# Patient Record
Sex: Male | Born: 1950
Health system: Southern US, Community
[De-identification: ages and names within clinical notes are randomized; demographics above are authoritative.]

## PROBLEM LIST (undated history)

## (undated) DIAGNOSIS — Z8701 Personal history of pneumonia (recurrent): Secondary | ICD-10-CM

## (undated) DIAGNOSIS — M109 Gout, unspecified: Secondary | ICD-10-CM

## (undated) DIAGNOSIS — J189 Pneumonia, unspecified organism: Secondary | ICD-10-CM

## (undated) DIAGNOSIS — R809 Proteinuria, unspecified: Secondary | ICD-10-CM

## (undated) DIAGNOSIS — Z87442 Personal history of urinary calculi: Secondary | ICD-10-CM

## (undated) DIAGNOSIS — N529 Male erectile dysfunction, unspecified: Secondary | ICD-10-CM

## (undated) DIAGNOSIS — D569 Thalassemia, unspecified: Secondary | ICD-10-CM

## (undated) DIAGNOSIS — Z1211 Encounter for screening for malignant neoplasm of colon: Secondary | ICD-10-CM

## (undated) DIAGNOSIS — E1129 Type 2 diabetes mellitus with other diabetic kidney complication: Secondary | ICD-10-CM

## (undated) DIAGNOSIS — E785 Hyperlipidemia, unspecified: Secondary | ICD-10-CM

## (undated) DIAGNOSIS — Z87438 Personal history of other diseases of male genital organs: Secondary | ICD-10-CM

## (undated) DIAGNOSIS — I10 Essential (primary) hypertension: Secondary | ICD-10-CM

## (undated) DIAGNOSIS — B0050 Herpesviral ocular disease, unspecified: Secondary | ICD-10-CM

## (undated) DIAGNOSIS — H269 Unspecified cataract: Secondary | ICD-10-CM

## (undated) DIAGNOSIS — C44319 Basal cell carcinoma of skin of other parts of face: Secondary | ICD-10-CM

## (undated) DIAGNOSIS — R5382 Chronic fatigue, unspecified: Secondary | ICD-10-CM

## (undated) DIAGNOSIS — J349 Unspecified disorder of nose and nasal sinuses: Secondary | ICD-10-CM

## (undated) DIAGNOSIS — B029 Zoster without complications: Secondary | ICD-10-CM

## (undated) DIAGNOSIS — D509 Iron deficiency anemia, unspecified: Secondary | ICD-10-CM

## (undated) DIAGNOSIS — Z8719 Personal history of other diseases of the digestive system: Secondary | ICD-10-CM

## (undated) DIAGNOSIS — E119 Type 2 diabetes mellitus without complications: Secondary | ICD-10-CM

## (undated) DIAGNOSIS — G5602 Carpal tunnel syndrome, left upper limb: Secondary | ICD-10-CM

## (undated) DIAGNOSIS — C4491 Basal cell carcinoma of skin, unspecified: Secondary | ICD-10-CM

## (undated) DIAGNOSIS — N200 Calculus of kidney: Secondary | ICD-10-CM

## (undated) HISTORY — DX: Essential (primary) hypertension: I10

## (undated) HISTORY — DX: Thalassemia, unspecified: D56.9

## (undated) HISTORY — DX: Iron deficiency anemia, unspecified: D50.9

## (undated) HISTORY — DX: Herpesviral ocular disease, unspecified: B00.50

## (undated) HISTORY — DX: Hyperlipidemia, unspecified: E78.5

## (undated) HISTORY — DX: Zoster without complications: B02.9

## (undated) HISTORY — DX: Basal cell carcinoma of skin of other parts of face: C44.319

## (undated) HISTORY — DX: Personal history of other diseases of male genital organs: Z87.438

## (undated) HISTORY — DX: Personal history of other diseases of the digestive system: Z87.19

## (undated) HISTORY — DX: Gout, unspecified: M10.9

## (undated) HISTORY — DX: Calculus of kidney: N20.0

## (undated) HISTORY — DX: Encounter for screening for malignant neoplasm of colon: Z12.11

## (undated) HISTORY — DX: Type 2 diabetes mellitus without complications: E11.9

## (undated) HISTORY — DX: Personal history of pneumonia (recurrent): Z87.01

## (undated) HISTORY — DX: Unspecified cataract: H26.9

## (undated) HISTORY — DX: Chronic fatigue, unspecified: R53.82

## (undated) HISTORY — DX: Pneumonia, unspecified organism: J18.9

## (undated) HISTORY — PX: WISDOM TOOTH EXTRACTION: SHX21

## (undated) HISTORY — DX: Type 2 diabetes mellitus with other diabetic kidney complication: E11.29

## (undated) HISTORY — DX: Type 2 diabetes mellitus with other diabetic kidney complication: R80.9

## (undated) HISTORY — DX: Male erectile dysfunction, unspecified: N52.9

---
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
## 2013-02-28 ENCOUNTER — Ambulatory Visit (INDEPENDENT_AMBULATORY_CARE_PROVIDER_SITE_OTHER): Payer: BC Managed Care – PPO | Admitting: Family Medicine

## 2013-02-28 ENCOUNTER — Encounter: Payer: Self-pay | Admitting: Family Medicine

## 2013-02-28 VITALS — BP 163/90 | HR 97 | Temp 98.4°F | Resp 18 | Ht 73.0 in | Wt 219.0 lb

## 2013-02-28 DIAGNOSIS — J019 Acute sinusitis, unspecified: Secondary | ICD-10-CM

## 2013-02-28 DIAGNOSIS — R030 Elevated blood-pressure reading, without diagnosis of hypertension: Secondary | ICD-10-CM

## 2013-02-28 MED ORDER — AMOXICILLIN 875 MG PO TABS: 875.0000 mg | ORAL_TABLET | Freq: Two times a day (BID) | ORAL | Status: DC

## 2013-02-28 MED ORDER — AZELASTINE-FLUTICASONE 137-50 MCG/ACT NA SUSP: NASAL | Status: DC

## 2013-02-28 NOTE — Progress Notes (Signed)
Office Note 02/28/2013  CC:  Chief Complaint  Patient presents with  . Establish Care  . Nasal Congestion    x 2 weeks    HPI:  Zachary Chandler is a 63 y.o. White male who is here to establish care and discuss sinus issues. Patient's most recent primary MD: none.  Describes occasional UC visit for "sinusitis", he can't recall which urgent care. Old records were not reviewed prior to or during today's visit.  Onset about 3 wks ago, started with subjective f/c and one episode of vomiting.  Felt malaise for the next 1 wk or so, then started to get ST, itchy ears, sneezing, peri-orbital HA.  Nasal congestion/runny nose, some puffiness of inferior eyelids, some diffuse facial pressure/pain.  Mild coughing.  Sx's worse in evenings. No fever since that initial day of illness.  Taking ibuprofen round the clock. Some OTC meds helped stem the sx's initially.  He checks bp every couple weeks, usually  150s on top and 80s on bottom.  Says bp usually higher at MD office.  He admits he has been told at an urgent care visit in remote past that he has HTN and needs to get on med.   Past Medical History  Diagnosis Date  . Kidney stones 1980s  . History of prostatitis 1980s    Remote hx of seeing MD's at [place] [facility]  . Gout of big toe 1990s    only one episode; he cut back on peanut butter and hasn't had any prob since  . Elevated blood pressure reading without diagnosis of hypertension 02/28/2013    Past Surgical History  Procedure Laterality Date  . Nasal polyp excision  1960    Family History  Problem Relation Age of Onset  . Heart disease Mother   . Hypertension Mother   . Diabetes Mother   . Heart disease Father   . Heart attack Sister     History   Social History  . Marital Status: Married    Spouse Name: N/A    Number of Children: N/A  . Years of Education: N/A   Occupational History  . Not on file.   Social History Main Topics  . Smoking status: Former  Smoker    Quit date: 02/28/1970  . Smokeless tobacco: Never Used  . Alcohol Use: No  . Drug Use: No  . Sexual Activity: Not on file   Other Topics Concern  . Not on file   Social History Narrative   Married, has one 71 y/o son.   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     Orig from [place].   Was in [phone].  No combat duty.  No VA care.   Smoked 10-12 yrs, quit in his early 22s.   Alcohol: none   No hx of drugs.               MEDS: neosynephrin 4-5 times per day lately, zyrtec qd prn,   No Known Allergies  ROS Review of Systems  Constitutional: Positive for fatigue. Negative for fever.  HENT: Negative for ear pain, nosebleeds and sore throat.   Eyes: Negative for visual disturbance.  Respiratory: Negative for chest tightness, shortness of breath and wheezing.   Cardiovascular: Negative for chest pain and leg swelling.  Gastrointestinal: Negative for nausea and abdominal pain.  Genitourinary: Negative for dysuria.  Musculoskeletal: Negative for back pain and joint swelling.  Skin: Negative for rash.  Neurological: Negative for weakness and  headaches.  Hematological: Negative for adenopathy.  Psychiatric/Behavioral: Negative for dysphoric mood. The patient is not nervous/anxious.      PE; Blood pressure 163/90, pulse 97, temperature 98.4 F (36.9 C), temperature source Temporal, resp. rate 18, height 6\' 1"  (1.854 m), weight 219 lb (99.338 kg), SpO2 97.00%. Gen: Alert, well appearing.  Patient is oriented to person, place, time, and situation. AFFECT: pleasant, lucid thought and speech. HEENT: eyes without injection, drainage, or swelling.  Ears: EACs clear, TMs with normal light reflex and landmarks.  Nose: Clear rhinorrhea, with some dried, crusty exudate adherent to mildly injected mucosa.  No purulent d/c.  No paranasal sinus TTP.  No facial swelling.  Throat and mouth without focal lesion.  No pharyngial swelling, erythema, or exudate.    Neck: supple, no LAD.   LUNGS: CTA bilat, nonlabored resps.   CV: RRR, no m/r/g. EXT: no c/c/e SKIN: no rash  Pertinent labs:  none  ASSESSMENT AND PLAN:   New pt: no old records to obtain  Sinusitis, acute Amoxil 875mg  bid x 10d. Discussed importance of weening off the neo-synephrine.  He expressed reluctance but agreed to try. Will start trial of dymista 1 spray each nostril bid.  Elevated blood pressure reading without diagnosis of hypertension Likely has essential HTN, but with frequent use of neo-synephrine lately we need to see how his bp is OFF of this type of med. I recommended he also avoid OTC oral decongestants. He'll check bp 1-2 times per day and we'll review these at next visit.   Spent 40 min with pt today, with >50% of this time spent in counseling and care coordination regarding the above problems.  Return for f/u at pt's convenience for fasting CPE.

## 2013-02-28 NOTE — Assessment & Plan Note (Signed)
Amoxil 875mg  bid x 10d. Discussed importance of weening off the neo-synephrine.  He expressed reluctance but agreed to try. Will start trial of dymista 1 spray each nostril bid.

## 2013-02-28 NOTE — Progress Notes (Signed)
Pre visit review using our clinic review tool, if applicable. No additional management support is needed unless otherwise documented below in the visit note. 

## 2013-02-28 NOTE — Assessment & Plan Note (Signed)
Likely has essential HTN, but with frequent use of neo-synephrine lately we need to see how his bp is OFF of this type of med. I recommended he also avoid OTC oral decongestants. He'll check bp 1-2 times per day and we'll review these at next visit.

## 2013-08-31 ENCOUNTER — Ambulatory Visit (INDEPENDENT_AMBULATORY_CARE_PROVIDER_SITE_OTHER): Payer: Commercial Managed Care - HMO | Admitting: Family Medicine

## 2013-08-31 ENCOUNTER — Encounter: Payer: Self-pay | Admitting: Family Medicine

## 2013-08-31 VITALS — BP 176/99 | HR 68 | Temp 98.0°F | Resp 18 | Ht 73.0 in | Wt 225.0 lb

## 2013-08-31 DIAGNOSIS — I10 Essential (primary) hypertension: Secondary | ICD-10-CM

## 2013-08-31 DIAGNOSIS — R5383 Other fatigue: Secondary | ICD-10-CM

## 2013-08-31 DIAGNOSIS — R81 Glycosuria: Secondary | ICD-10-CM | POA: Insufficient documentation

## 2013-08-31 DIAGNOSIS — N410 Acute prostatitis: Secondary | ICD-10-CM | POA: Insufficient documentation

## 2013-08-31 DIAGNOSIS — R5381 Other malaise: Secondary | ICD-10-CM | POA: Insufficient documentation

## 2013-08-31 LAB — HEMOGLOBIN A1C: Hgb A1c MFr Bld: 11.6 % — ABNORMAL HIGH (ref 4.6–6.5)

## 2013-08-31 LAB — POCT URINALYSIS DIPSTICK
BILIRUBIN UA: NEGATIVE
Blood, UA: NEGATIVE
GLUCOSE UA: 500
Ketones, UA: NEGATIVE
Leukocytes, UA: NEGATIVE
Nitrite, UA: NEGATIVE
Protein, UA: NEGATIVE
SPEC GRAV UA: 1.02
UROBILINOGEN UA: 0.2
pH, UA: 5.5

## 2013-08-31 LAB — BASIC METABOLIC PANEL
BUN: 12 mg/dL (ref 6–23)
CALCIUM: 9.3 mg/dL (ref 8.4–10.5)
CO2: 23 mEq/L (ref 19–32)
CREATININE: 0.9 mg/dL (ref 0.4–1.5)
Chloride: 103 mEq/L (ref 96–112)
GFR: 94.77 mL/min (ref >60.00)
Glucose, Bld: 283 mg/dL — ABNORMAL HIGH (ref 70–99)
Potassium: 4.1 mEq/L (ref 3.5–5.1)
Sodium: 136 mEq/L (ref 135–145)

## 2013-08-31 LAB — TSH: TSH: 0.93 u[IU]/mL (ref 0.35–4.50)

## 2013-08-31 MED ORDER — CIPROFLOXACIN HCL 750 MG PO TABS: 750.0000 mg | ORAL_TABLET | Freq: Two times a day (BID) | ORAL | Status: DC

## 2013-08-31 NOTE — Assessment & Plan Note (Signed)
Ciprofloxacin 750mg  bid x 14d. Sent urine for c/s for completeness.

## 2013-08-31 NOTE — Assessment & Plan Note (Signed)
Check serum glucose and HbA1c today. Certainly if he has undiagnosed diabetes then this could explain his fatigue.

## 2013-08-31 NOTE — Progress Notes (Signed)
OFFICE VISIT  08/31/2013   CC:  Chief Complaint  Patient presents with  . Urinary Frequency    started last Thursday  . Fatigue  . sweats   HPI:    Patient is a 63 y.o. Caucasian male who presents for urinary ugency but difficulty getting anything out, some suprapubic pressure.  Mild discomfort with urination but not exactly burning.  Feels better lying supine.  Pees easier sitting on commode.  Urine flow starting and stopping.  Not much nocturia:  Pt not clear on this point, though. Onset of sx's 7 days ago.  No nausea or fever.   BP usually 150s/70s at home.  +Hx of elevated bp here w/out dx of HTN.  Other issues to discuss: chronic tiredness despite sleeping well. Erectile dysfunction.  We've agreed to put these off until another visit.  Past Medical History  Diagnosis Date  . Kidney stones 1980s  . History of prostatitis 1980s    Remote hx of seeing Dr. Terance Hart at [facility] urology  . Gout of big toe 1990s    only one episode; he cut back on peanut butter and hasn't had any prob since  . Elevated blood pressure reading without diagnosis of hypertension 02/28/2013    Past Surgical History  Procedure Laterality Date  . Nasal polyp excision  1960    MEDS: Dymista daily  No Known Allergies  ROS As per HPI  PE: Blood pressure 176/99, pulse 68, temperature 98 F (36.7 C), temperature source Temporal, resp. rate 18, height 6\' 1"  (1.854 m), weight 225 lb (102.059 kg), SpO2 97.00%. Gen: Alert, well appearing.  Patient is oriented to person, place, time, and situation. VEH:MCNO: no injection, icteris, swelling, or exudate.  EOMI, PERRLA. Mouth: lips without lesion/swelling.  Oral mucosa pink and moist. Oropharynx without erythema, exudate, or swelling.  CV: RRR, no m/r/g.   LUNGS: CTA bilat, nonlabored resps, good aeration in all lung fields. ABD: soft, NT, ND, BS normal.  No hepatospenomegaly or mass.  No bruits. EXT: no clubbing, cyanosis, or edema.  RECTAL: mild  diffuse prostate fluctuance but not really tender.  Some pressure and urge to urinate with palpation of prostate.  Prostate not significantly enlarged and no nodule or induration or asymmetry.  LABS:  CC UA today: gluc 500mg /dl, otherwise normal  IMPRESSION AND PLAN:  Acute prostatitis Ciprofloxacin 750mg  bid x 14d. Sent urine for c/s for completeness.  HTN (hypertension), benign Will check Cr/lytes. If these are normal then will start irbesartan 150mg  qd. This could be contributing to his feeling of being tired all the time.  Glucosuria Check serum glucose and HbA1c today. Certainly if he has undiagnosed diabetes then this could explain his fatigue.  Other malaise and fatigue Multifactorial: infection, uncontrolled HTN, possibly DM 2 new dx (labs pending).   An After Visit Summary was printed and given to the patient.  FOLLOW UP: Return in about 2 weeks (around 09/14/2013) for f/u prostatitis and HTN.

## 2013-08-31 NOTE — Progress Notes (Signed)
Pre visit review using our clinic review tool, if applicable. No additional management support is needed unless otherwise documented below in the visit note. 

## 2013-08-31 NOTE — Assessment & Plan Note (Signed)
Will check Cr/lytes. If these are normal then will start irbesartan 150mg  qd. This could be contributing to his feeling of being tired all the time.

## 2013-08-31 NOTE — Assessment & Plan Note (Signed)
Multifactorial: infection, uncontrolled HTN, possibly DM 2 new dx (labs pending).

## 2013-09-01 ENCOUNTER — Telehealth: Payer: Self-pay | Admitting: Family Medicine

## 2013-09-01 LAB — URINE CULTURE
COLONY COUNT: NO GROWTH
ORGANISM ID, BACTERIA: NO GROWTH

## 2013-09-01 MED ORDER — METFORMIN HCL 500 MG PO TABS: 500.0000 mg | ORAL_TABLET | Freq: Two times a day (BID) | ORAL | Status: DC

## 2013-09-01 MED ORDER — IRBESARTAN 150 MG PO TABS: 150.0000 mg | ORAL_TABLET | Freq: Every day | ORAL | Status: DC

## 2013-09-01 NOTE — Addendum Note (Signed)
Addended by: Jannette Spanner on: 09/01/2013 11:45 AM   Modules accepted: Orders

## 2013-09-01 NOTE — Telephone Encounter (Signed)
Relevant patient education mailed to patient.  

## 2013-09-06 ENCOUNTER — Ambulatory Visit (INDEPENDENT_AMBULATORY_CARE_PROVIDER_SITE_OTHER): Payer: Commercial Managed Care - HMO | Admitting: Family Medicine

## 2013-09-06 ENCOUNTER — Encounter: Payer: Self-pay | Admitting: Family Medicine

## 2013-09-06 VITALS — BP 158/91 | HR 73 | Temp 98.3°F | Resp 18 | Ht 73.0 in | Wt 225.0 lb

## 2013-09-06 DIAGNOSIS — E1165 Type 2 diabetes mellitus with hyperglycemia: Principal | ICD-10-CM

## 2013-09-06 DIAGNOSIS — I10 Essential (primary) hypertension: Secondary | ICD-10-CM

## 2013-09-06 DIAGNOSIS — N410 Acute prostatitis: Secondary | ICD-10-CM

## 2013-09-06 DIAGNOSIS — IMO0001 Reserved for inherently not codable concepts without codable children: Secondary | ICD-10-CM

## 2013-09-06 MED ORDER — METFORMIN HCL 1000 MG PO TABS: 1000.0000 mg | ORAL_TABLET | Freq: Two times a day (BID) | ORAL | Status: DC

## 2013-09-06 NOTE — Progress Notes (Signed)
OFFICE VISIT  09/07/2013   CC:  Chief Complaint  Patient presents with  . Diabetes  . Dizziness    since starting new medications   HPI:    Patient is a 63 y.o. Caucasian male who presents for 6d f/u for new dx HTN, new dx DM 2, and acute prostatitis. Started cipro, irbesartan 150mg  qd, and metformin 500 mg bid. BP checks from home reviewed today: avg syst 145, avg diast 80. Feels a bit jumpy, on edge--intermittently.  Still very fatigued.  Says urinary sx's are much improved, still just a "twinge" of discomfort.   He says he is tolerating metformin and irbesartan fine.   Discussed new dx DM 2, monitoring plans, initial mgmt plans. He feels comfortable with DM diet given past experience with relatives/friends required to eat diabetic diet. He is apprehensive about his new dx's, does not want to take insulin. He is eating and drinking w/out problem.  No fever, no nausea, no abd pain.  Past Medical History  Diagnosis Date  . Kidney stones 1980s  . History of prostatitis 1980s    Remote hx of seeing Dr. Terance Hart at [facility] u[facility]  . Gout of big toe 1990s    only one episode; he cut back on peanut butter and hasn't had any prob since  . Elevated blood pressure reading without diagnosis of hypertension 02/28/2013  . Diabetes mellitus, type 2 Dx'd 08/2013    Past Surgical History  Procedure Laterality Date  . Nasal polyp excision  1960    Outpatient Prescriptions Prior to Visit  Medication Sig Dispense Refill  . Azelastine-Fluticasone (DYMISTA) 137-50 MCG/ACT SUSP 1 spray in each nostril bid  1 Bottle  6  . ciprofloxacin (CIPRO) 750 MG tablet Take 1 tablet (750 mg total) by mouth 2 (two) times daily.  28 tablet  0  . irbesartan (AVAPRO) 150 MG tablet Take 1 tablet (150 mg total) by mouth daily.  30 tablet  6  . metFORMIN (GLUCOPHAGE) 500 MG tablet Take 1 tablet (500 mg total) by mouth 2 (two) times daily with a meal.  60 tablet  6   No facility-administered medications prior  to visit.    No Known Allergies  ROS As per HPI  PE: Blood pressure 158/91, pulse 73, temperature 98.3 F (36.8 C), temperature source Temporal, resp. rate 18, height 6\' 1"  (1.854 m), weight 225 lb (102.059 kg), SpO2 98.00%. Gen: Alert, well appearing.  Patient is oriented to person, place, time, and situation. AFFECT: pleasant, lucid thought and speech. No further exam today.  LABS:  CBG today (nonfasting): 265. Lab Results  Component Value Date   HGBA1C 11.6* 08/31/2013     Chemistry      Component Value Date/Time   NA 136 08/31/2013 1025   K 4.1 08/31/2013 1025   CL 103 08/31/2013 1025   CO2 23 08/31/2013 1025   BUN 12 08/31/2013 1025   CREATININE 0.9 08/31/2013 1025      Component Value Date/Time   CALCIUM 9.3 08/31/2013 1025      IMPRESSION AND PLAN:  Type II or unspecified type diabetes mellitus without mention of complication, uncontrolled Nutrition plan discussed: he has a good grasp of the basics, wants to do some reading on his own, declines my recommendation of nutritionist referral at this time. Increase metformin to 1000 mg bid.  Check glucose fasting and once postprandially for now.   HTN (hypertension), benign The current medical regimen is effective;  continue present plan and medications.  Acute prostatitis Resolving appropriately on abx.  An After Visit Summary was printed and given to the patient.  FOLLOW UP: Return in about 2 weeks (around 09/20/2013) for f/u DM and HTN.

## 2013-09-06 NOTE — Progress Notes (Signed)
Pre visit review using our clinic review tool, if applicable. No additional management support is needed unless otherwise documented below in the visit note. 

## 2013-09-07 ENCOUNTER — Encounter: Payer: Self-pay | Admitting: Family Medicine

## 2013-09-07 DIAGNOSIS — E118 Type 2 diabetes mellitus with unspecified complications: Secondary | ICD-10-CM | POA: Insufficient documentation

## 2013-09-07 NOTE — Assessment & Plan Note (Signed)
Nutrition plan discussed: he has a good grasp of the basics, wants to do some reading on his own, declines my recommendation of nutritionist referral at this time. Increase metformin to 1000 mg bid.  Check glucose fasting and once postprandially for now.

## 2013-09-07 NOTE — Assessment & Plan Note (Signed)
Resolving appropriately on abx.

## 2013-09-07 NOTE — Assessment & Plan Note (Signed)
The current medical regimen is effective;  continue present plan and medications.  

## 2013-09-14 ENCOUNTER — Ambulatory Visit: Payer: Medicare Other | Admitting: Family Medicine

## 2013-09-20 ENCOUNTER — Encounter: Payer: Self-pay | Admitting: Family Medicine

## 2013-09-20 ENCOUNTER — Ambulatory Visit (INDEPENDENT_AMBULATORY_CARE_PROVIDER_SITE_OTHER): Payer: Commercial Managed Care - HMO | Admitting: Family Medicine

## 2013-09-20 VITALS — BP 181/83 | HR 79 | Temp 97.6°F | Resp 18 | Ht 73.0 in | Wt 225.0 lb

## 2013-09-20 DIAGNOSIS — I10 Essential (primary) hypertension: Secondary | ICD-10-CM

## 2013-09-20 DIAGNOSIS — Z23 Encounter for immunization: Secondary | ICD-10-CM

## 2013-09-20 DIAGNOSIS — IMO0001 Reserved for inherently not codable concepts without codable children: Secondary | ICD-10-CM

## 2013-09-20 DIAGNOSIS — E1165 Type 2 diabetes mellitus with hyperglycemia: Principal | ICD-10-CM

## 2013-09-20 LAB — MICROALBUMIN / CREATININE URINE RATIO
Creatinine,U: 94.1 mg/dL
Microalb Creat Ratio: 1.1 mg/g (ref 0.0–30.0)
Microalb, Ur: 1 mg/dL (ref 0.0–1.9)

## 2013-09-20 NOTE — Progress Notes (Signed)
OFFICE NOTE  09/20/2013  CC:  Chief Complaint  Patient presents with  . Follow-up    blood pressure     HPI: Patient is a 63 y.o. Caucasian male who is here for 2 wk f/u recent new dx of DM 2 and HTN. Home bp's 130/70.   Glucoses: fasting still avg 180s. 2H PP: avg 170s.   He is really starting to pay attention to eating a diabetic diet.  Learning to snack appropriately. No side effects from the 1000 mg dosing of metformin.  Feet: chronic, intermittent numbness in right lateral calf and sometimes in right foot--pt reports hx of recurrent sciatica.  No constant burning in feet or toes.  No constant loss of sensation is noted.  No hx of ulcer.    Pertinent PMH:  Past medical, surgical, social, and family history reviewed and no changes are noted since last office visit.  MEDS: Not on cipro or dymista listed below. Outpatient Prescriptions Prior to Visit  Medication Sig Dispense Refill  . irbesartan (AVAPRO) 150 MG tablet Take 1 tablet (150 mg total) by mouth daily.  30 tablet  6  . metFORMIN (GLUCOPHAGE) 1000 MG tablet Take 1 tablet (1,000 mg total) by mouth 2 (two) times daily with a meal.  60 tablet  6  . Azelastine-Fluticasone (DYMISTA) 137-50 MCG/ACT SUSP 1 spray in each nostril bid  1 Bottle  6  . ciprofloxacin (CIPRO) 750 MG tablet Take 1 tablet (750 mg total) by mouth 2 (two) times daily.  28 tablet  0   No facility-administered medications prior to visit.    PE: Blood pressure 181/83, pulse 79, temperature 97.6 F (36.4 C), temperature source Temporal, resp. rate 18, height 6\' 1"  (1.854 m), weight 225 lb (102.059 kg), SpO2 97.00%. Gen: Alert, well appearing.  Patient is oriented to person, place, time, and situation. Foot exam - bilateral normal; no swelling, tenderness or skin or vascular lesions. Color and temperature is normal. Sensation is intact. Peripheral pulses are palpable. Toenails are normal.   IMPRESSION AND PLAN:  1) DM 2, new dx and still not very  well controlled. I want to give him more time on the 1000 mg bid metformin dosing and continue to let him work on his diet: he is very motivated to stay OFF INSULIN.  Drop glucoses off again in 2 wks and we'll add med at that time if needed.   Feet exam today was normal.  Urine for microalb/cr today. Prevnar 13 IM today.  2) HTN: good control as per home bp measurements. The current medical regimen is effective;  continue present plan and medications.  An After Visit Summary was printed and given to the patient.  FOLLOW UP: 1 mo

## 2013-09-20 NOTE — Progress Notes (Signed)
Pre visit review using our clinic review tool, if applicable. No additional management support is needed unless otherwise documented below in the visit note. 

## 2013-09-21 ENCOUNTER — Other Ambulatory Visit: Payer: Self-pay | Admitting: Family Medicine

## 2013-09-21 MED ORDER — GLUCOSE BLOOD VI STRP: ORAL_STRIP | Status: DC

## 2013-09-21 MED ORDER — FREESTYLE LITE DEVI: Status: DC

## 2013-09-21 MED ORDER — FREESTYLE LANCETS MISC: Status: DC

## 2013-10-18 ENCOUNTER — Encounter: Payer: Self-pay | Admitting: Family Medicine

## 2013-10-18 ENCOUNTER — Ambulatory Visit (INDEPENDENT_AMBULATORY_CARE_PROVIDER_SITE_OTHER): Payer: Commercial Managed Care - HMO | Admitting: Family Medicine

## 2013-10-18 VITALS — BP 164/91 | HR 74 | Temp 98.4°F | Resp 18 | Ht 73.0 in | Wt 224.0 lb

## 2013-10-18 DIAGNOSIS — I10 Essential (primary) hypertension: Secondary | ICD-10-CM

## 2013-10-18 DIAGNOSIS — IMO0001 Reserved for inherently not codable concepts without codable children: Secondary | ICD-10-CM

## 2013-10-18 DIAGNOSIS — E1165 Type 2 diabetes mellitus with hyperglycemia: Principal | ICD-10-CM

## 2013-10-18 MED ORDER — PIOGLITAZONE HCL 15 MG PO TABS: 15.0000 mg | ORAL_TABLET | Freq: Every day | ORAL | Status: DC

## 2013-10-18 NOTE — Progress Notes (Signed)
OFFICE NOTE  10/18/2013  CC:  Chief Complaint  Patient presents with  . Follow-up     HPI: Patient is a 63 y.o. Caucasian male who is here for 1 mo f/u DM 2, HTN. Feeling well, no acute complaints. Doing well with diet: 5 smaller meals a day. Checks gluc fasting and hs: avg around 150-160 at each check.  He is active, mows yard and does some walking.  Nothing too regular right now. Compliant with med.  BP at home 150/85 this morning at home.  He says home avg 135/80.   ROS: no CP, no SOB, no leg swelling, no muscle or joint aches, no rash.  Pertinent PMH:  Past medical, surgical, social, and family history reviewed and no changes are noted since last office visit.  MEDS:  Outpatient Prescriptions Prior to Visit  Medication Sig Dispense Refill  . irbesartan (AVAPRO) 150 MG tablet Take 1 tablet (150 mg total) by mouth daily.  30 tablet  6  . metFORMIN (GLUCOPHAGE) 1000 MG tablet Take 1 tablet (1,000 mg total) by mouth 2 (two) times daily with a meal.  60 tablet  6  . Blood Glucose Monitoring Suppl (FREESTYLE LITE) DEVI Check sugar BID  1 each  0  . glucose blood (FREESTYLE LITE) test strip Use as instructed  100 each  12  . Lancets (FREESTYLE) lancets Use as instructed  100 each  12   No facility-administered medications prior to visit.    PE: Blood pressure 164/91, pulse 74, temperature 98.4 F (36.9 C), temperature source Temporal, resp. rate 18, height 6\' 1"  (1.854 m), weight 224 lb (101.606 kg), SpO2 98.00%. Gen: Alert, well appearing.  Patient is oriented to person, place, time, and situation. No further exam today.  IMPRESSION AND PLAN:  1) DM 2, improved numbers on metformin but still not near goal. Add actos 15mg  qd, continue 1000 mg metformin bid. He is not due for repeat of HbA1c for another 6-8 wks.  2) HTN; as per home measurements he is at goal.  Continue avapro at 150mg  qd.  3) Prev health care: he declines flu vaccine.  An After Visit Summary was  printed and given to the patient.  FOLLOW UP: 4 wks

## 2013-10-18 NOTE — Progress Notes (Signed)
Pre visit review using our clinic review tool, if applicable. No additional management support is needed unless otherwise documented below in the visit note. 

## 2013-11-23 ENCOUNTER — Encounter: Payer: Self-pay | Admitting: Family Medicine

## 2013-11-23 ENCOUNTER — Ambulatory Visit (INDEPENDENT_AMBULATORY_CARE_PROVIDER_SITE_OTHER): Payer: Commercial Managed Care - HMO | Admitting: Family Medicine

## 2013-11-23 VITALS — BP 174/86 | HR 66 | Temp 97.2°F | Resp 18 | Ht 73.0 in | Wt 227.0 lb

## 2013-11-23 DIAGNOSIS — I10 Essential (primary) hypertension: Secondary | ICD-10-CM

## 2013-11-23 DIAGNOSIS — E1165 Type 2 diabetes mellitus with hyperglycemia: Secondary | ICD-10-CM

## 2013-11-23 MED ORDER — IRBESARTAN 300 MG PO TABS: 300.0000 mg | ORAL_TABLET | Freq: Every day | ORAL | Status: DC

## 2013-11-23 MED ORDER — PIOGLITAZONE HCL 30 MG PO TABS: 30.0000 mg | ORAL_TABLET | Freq: Every day | ORAL | Status: DC

## 2013-11-23 NOTE — Progress Notes (Signed)
OFFICE NOTE  11/23/2013  CC:  Chief Complaint  Patient presents with  . Follow-up     HPI: Patient is a 63 y.o. Caucasian male who is here for 1 mo f/u DM 2, HTN. Avg syst bp at home 150s, highest 160.  Diastolic in the 40H.  HR usually low 70s. Gluc's: avg 150s fasting and PP, some more erratic numbers PP.  Trying to eat diabetic diet but inconsistent.  Last eye exam was 3 yrs ago.  No acute complaints.  Pertinent PMH:  Past medical, surgical, social, and family history reviewed and no changes are noted since last office visit.  MEDS:  Outpatient Prescriptions Prior to Visit  Medication Sig Dispense Refill  . cetirizine (ZYRTEC) 10 MG tablet Take 10 mg by mouth daily.      . irbesartan (AVAPRO) 150 MG tablet Take 1 tablet (150 mg total) by mouth daily.  30 tablet  6  . metFORMIN (GLUCOPHAGE) 1000 MG tablet Take 1 tablet (1,000 mg total) by mouth 2 (two) times daily with a meal.  60 tablet  6  . pioglitazone (ACTOS) 15 MG tablet Take 1 tablet (15 mg total) by mouth daily.  30 tablet  1  . Blood Glucose Monitoring Suppl (FREESTYLE LITE) DEVI Check sugar BID  1 each  0  . glucose blood (FREESTYLE LITE) test strip Use as instructed  100 each  12  . Lancets (FREESTYLE) lancets Use as instructed  100 each  12   No facility-administered medications prior to visit.    PE: Blood pressure 174/86, pulse 66, temperature 97.2 F (36.2 C), temperature source Temporal, resp. rate 18, height 6\' 1"  (1.854 m), weight 227 lb (102.967 kg), SpO2 98.00%. Gen: Alert, well appearing.  Patient is oriented to person, place, time, and situation. AFFECT: pleasant, lucid thought and speech. No further exam today.  IMPRESSION AND PLAN:  1) DM 2, non insulin-requiring. Control not ideal.  Push actos to 30mg  qd. Repeat A1c at next f/u in 6 wks. Emphasized importance of pt getting his diab retpthy screening exam.  2) HTN; not ideal control.  Push avapro to 300 mg qhs. Continue home  monitoring.  An After Visit Summary was printed and given to the patient.  FOLLOW UP:  6 wks, fasting (needs FLP at that time)

## 2013-11-23 NOTE — Patient Instructions (Signed)
Make appt with an eye doctor for Zachary Chandler

## 2013-11-23 NOTE — Progress Notes (Signed)
Pre visit review using our clinic review tool, if applicable. No additional management support is needed unless otherwise documented below in the visit note. 

## 2013-12-23 ENCOUNTER — Other Ambulatory Visit: Payer: Self-pay | Admitting: Family Medicine

## 2013-12-23 MED ORDER — PIOGLITAZONE HCL 30 MG PO TABS: 30.0000 mg | ORAL_TABLET | Freq: Every day | ORAL | Status: DC

## 2014-01-03 ENCOUNTER — Encounter: Payer: Self-pay | Admitting: Family Medicine

## 2014-01-03 ENCOUNTER — Ambulatory Visit (INDEPENDENT_AMBULATORY_CARE_PROVIDER_SITE_OTHER): Payer: Commercial Managed Care - HMO | Admitting: Family Medicine

## 2014-01-03 VITALS — BP 179/110 | HR 62 | Temp 98.4°F | Resp 18 | Ht 73.0 in | Wt 228.0 lb

## 2014-01-03 DIAGNOSIS — I10 Essential (primary) hypertension: Secondary | ICD-10-CM

## 2014-01-03 DIAGNOSIS — E119 Type 2 diabetes mellitus without complications: Secondary | ICD-10-CM

## 2014-01-03 LAB — LIPID PANEL
Cholesterol: 156 mg/dL (ref 0–200)
HDL: 34.5 mg/dL — AB (ref >39.00)
LDL Cholesterol: 104 mg/dL — ABNORMAL HIGH (ref 0–99)
NonHDL: 121.5
TRIGLYCERIDES: 89 mg/dL (ref 0.0–149.0)
Total CHOL/HDL Ratio: 5
VLDL: 17.8 mg/dL (ref 0.0–40.0)

## 2014-01-03 LAB — COMPREHENSIVE METABOLIC PANEL
ALT: 22 U/L (ref 0–53)
AST: 27 U/L (ref 0–37)
Albumin: 4.2 g/dL (ref 3.5–5.2)
Alkaline Phosphatase: 45 U/L (ref 39–117)
BILIRUBIN TOTAL: 1.3 mg/dL — AB (ref 0.2–1.2)
BUN: 16 mg/dL (ref 6–23)
CO2: 27 mEq/L (ref 19–32)
CREATININE: 0.9 mg/dL (ref 0.4–1.5)
Calcium: 9.2 mg/dL (ref 8.4–10.5)
Chloride: 101 mEq/L (ref 96–112)
GFR: 92.19 mL/min (ref >60.00)
Glucose, Bld: 124 mg/dL — ABNORMAL HIGH (ref 70–99)
Potassium: 4.7 mEq/L (ref 3.5–5.1)
Sodium: 139 mEq/L (ref 135–145)
Total Protein: 6.9 g/dL (ref 6.0–8.3)

## 2014-01-03 LAB — HEMOGLOBIN A1C: Hgb A1c MFr Bld: 6.6 % — ABNORMAL HIGH (ref 4.6–6.5)

## 2014-01-03 MED ORDER — AMLODIPINE BESYLATE 10 MG PO TABS: 10.0000 mg | ORAL_TABLET | Freq: Every day | ORAL | Status: DC

## 2014-01-03 NOTE — Progress Notes (Signed)
Pre visit review using our clinic review tool, if applicable. No additional management support is needed unless otherwise documented below in the visit note. 

## 2014-01-03 NOTE — Progress Notes (Signed)
OFFICE NOTE  01/03/2014  CC:  Chief Complaint  Patient presents with  . Follow-up   HPI: Patient is a 63 y.o. Caucasian male who is here for 6 wk f/u HTN, DM 2, and needs FLP.   No meds taken today but o/w compliant: fasting for lipid panel today..  Home bp : avg 150 syst, diast avg 80, HR 60s.   Feeling fine, no HA's/CP/SOB/Dizziness.  Last 2 wks, glucoses show morning and evening avg 130s.  He is working on diet.  He bought an exercise bike.  Pertinent PMH:  Past medical, surgical, social, and family history reviewed and no changes are noted since last office visit.  MEDS:  Outpatient Prescriptions Prior to Visit  Medication Sig Dispense Refill  . Blood Glucose Monitoring Suppl (FREESTYLE LITE) DEVI Check sugar BID 1 each 0  . cetirizine (ZYRTEC) 10 MG tablet Take 10 mg by mouth daily.    Marland Kitchen glucose blood (FREESTYLE LITE) test strip Use as instructed 100 each 12  . irbesartan (AVAPRO) 300 MG tablet Take 1 tablet (300 mg total) by mouth daily. 30 tablet 3  . Lancets (FREESTYLE) lancets Use as instructed 100 each 12  . metFORMIN (GLUCOPHAGE) 1000 MG tablet Take 1 tablet (1,000 mg total) by mouth 2 (two) times daily with a meal. 60 tablet 6  . pioglitazone (ACTOS) 30 MG tablet Take 1 tablet (30 mg total) by mouth daily. 30 tablet 3   No facility-administered medications prior to visit.    PE: Height 6\' 1"  (1.854 m), weight 228 lb (103.42 kg). Gen: Alert, well appearing.  Patient is oriented to person, place, time, and situation. AFFECT: pleasant, lucid thought and speech. No further exam today.  LABS: none today Recent:  Lab Results  Component Value Date   HGBA1C 11.6* 08/31/2013     Chemistry      Component Value Date/Time   NA 136 08/31/2013 1025   K 4.1 08/31/2013 1025   CL 103 08/31/2013 1025   CO2 23 08/31/2013 1025   BUN 12 08/31/2013 1025   CREATININE 0.9 08/31/2013 1025      Component Value Date/Time   CALCIUM 9.3 08/31/2013 1025       IMPRESSION AND  PLAN:  1) DM 2, no complications.  Home measurments pretty darn good. Due for HbA1c. He is planning on getting his diab retpthy screening exam after Feb 10 2014 for financial reasons.  2) HTN, not ideal control.  Discussed goal <130/80 optimally.  Add amlodipine 10mg  qd, continue irbesartan 300 mg qd and low sodium diet.  Work on [employer].  3) Hyperlipidemia screening: FLP today.  An After Visit Summary was printed and given to the patient.  FOLLOW UP: 4 wks, HTN

## 2014-01-04 ENCOUNTER — Other Ambulatory Visit: Payer: Self-pay | Admitting: Family Medicine

## 2014-01-04 MED ORDER — ATORVASTATIN CALCIUM 20 MG PO TABS: 20.0000 mg | ORAL_TABLET | Freq: Every day | ORAL | Status: DC

## 2014-02-07 ENCOUNTER — Encounter: Payer: Self-pay | Admitting: Family Medicine

## 2014-02-07 ENCOUNTER — Ambulatory Visit (INDEPENDENT_AMBULATORY_CARE_PROVIDER_SITE_OTHER): Payer: Commercial Managed Care - HMO | Admitting: Family Medicine

## 2014-02-07 ENCOUNTER — Other Ambulatory Visit: Payer: Self-pay | Admitting: Family Medicine

## 2014-02-07 VITALS — BP 137/81 | HR 73 | Temp 97.7°F | Resp 18 | Ht 73.0 in | Wt 231.0 lb

## 2014-02-07 DIAGNOSIS — E119 Type 2 diabetes mellitus without complications: Secondary | ICD-10-CM | POA: Insufficient documentation

## 2014-02-07 DIAGNOSIS — I10 Essential (primary) hypertension: Secondary | ICD-10-CM

## 2014-02-07 DIAGNOSIS — E785 Hyperlipidemia, unspecified: Secondary | ICD-10-CM

## 2014-02-07 MED ORDER — GLUCOSE BLOOD VI STRP: ORAL_STRIP | Status: DC

## 2014-02-07 MED ORDER — HYDROCORTISONE VALERATE 0.2 % EX OINT: TOPICAL_OINTMENT | CUTANEOUS | Status: DC

## 2014-02-07 MED ORDER — FREESTYLE SYSTEM KIT: 1.0000 | PACK | Status: DC | PRN

## 2014-02-07 NOTE — Progress Notes (Signed)
Pre visit review using our clinic review tool, if applicable. No additional management support is needed unless otherwise documented below in the visit note. 

## 2014-02-07 NOTE — Progress Notes (Signed)
OFFICE NOTE  02/07/2014  CC:  Chief Complaint  Patient presents with  . Follow-up    not fasting   HPI: Patient is a 63 y.o. Caucasian male who is here for 1 mo f/u DM 2, HTN, hyperlipidemia. Added amlodipine last visit for bp control.  Home bp's: avg < 140 over < 90. A1c last visit excellent. We added statin last visit -atorva 20mg  qd. Tolerating all new meds fine.   Pertinent PMH:  Past medical, surgical, social, and family history reviewed and no changes are noted since last office visit.  MEDS:  Outpatient Prescriptions Prior to Visit  Medication Sig Dispense Refill  . amLODipine (NORVASC) 10 MG tablet Take 1 tablet (10 mg total) by mouth daily. 30 tablet 1  . atorvastatin (LIPITOR) 20 MG tablet Take 1 tablet (20 mg total) by mouth daily. 30 tablet 6  . Blood Glucose Monitoring Suppl (FREESTYLE LITE) DEVI Check sugar BID 1 each 0  . cetirizine (ZYRTEC) 10 MG tablet Take 10 mg by mouth daily.    Marland Kitchen glucose blood (FREESTYLE LITE) test strip Use as instructed 100 each 12  . irbesartan (AVAPRO) 300 MG tablet Take 1 tablet (300 mg total) by mouth daily. 30 tablet 3  . Lancets (FREESTYLE) lancets Use as instructed 100 each 12  . metFORMIN (GLUCOPHAGE) 1000 MG tablet Take 1 tablet (1,000 mg total) by mouth 2 (two) times daily with a meal. 60 tablet 6  . pioglitazone (ACTOS) 30 MG tablet Take 1 tablet (30 mg total) by mouth daily. 30 tablet 3   No facility-administered medications prior to visit.    PE: Blood pressure 137/81, pulse 73, temperature 97.7 F (36.5 C), temperature source Temporal, resp. rate 18, height 6\' 1"  (1.854 m), weight 231 lb (104.781 kg), SpO2 99 %. Gen: Alert, well appearing.  Patient is oriented to person, place, time, and situation. No further exam today   LAB: Lab Results  Component Value Date   CHOL 156 01/03/2014   HDL 34.50* 01/03/2014   LDLCALC 104* 01/03/2014   TRIG 89.0 01/03/2014   CHOLHDL 5 01/03/2014   Lab Results  Component Value  Date   HGBA1C 6.6* 01/03/2014     Chemistry      Component Value Date/Time   NA 139 01/03/2014 0822   K 4.7 01/03/2014 0822   CL 101 01/03/2014 0822   CO2 27 01/03/2014 0822   BUN 16 01/03/2014 0822   CREATININE 0.9 01/03/2014 0822      Component Value Date/Time   CALCIUM 9.2 01/03/2014 0822   ALKPHOS 45 01/03/2014 0822   AST 27 01/03/2014 0822   ALT 22 01/03/2014 0822   BILITOT 1.3* 01/03/2014 0822      IMPRESSION AND PLAN:  1) HTN; The current medical regimen is effective;  continue present plan and medications.  2) DM 2; The current medical regimen is effective;  continue present plan and medications. He'll get eye screening exam between now and next f/u visit with me in 4 mo.  3) Hyperlipidemia: would like LDL well below 100.  Just started statin and he's tolerating this fine. Recheck FLP next f/u in 40mo.  An After Visit Summary was printed and given to the patient.  FOLLOW UP: 18mo-fasting

## 2014-03-09 ENCOUNTER — Telehealth: Payer: Self-pay | Admitting: Family Medicine

## 2014-03-09 NOTE — Telephone Encounter (Signed)
Pt called and is requesting refills on his BP meds and his diabetes meds. Please call him at [phone]/DH

## 2014-03-10 MED ORDER — IRBESARTAN 300 MG PO TABS: 300.0000 mg | ORAL_TABLET | Freq: Every day | ORAL | Status: DC

## 2014-03-10 MED ORDER — AMLODIPINE BESYLATE 10 MG PO TABS: 10.0000 mg | ORAL_TABLET | Freq: Every day | ORAL | Status: DC

## 2014-03-10 NOTE — Telephone Encounter (Signed)
Spoke to pt.  Pt will contact pharmacy next time for rfs.

## 2014-03-25 LAB — HM DIABETES EYE EXAM

## 2014-04-13 ENCOUNTER — Other Ambulatory Visit: Payer: Self-pay | Admitting: Family Medicine

## 2014-05-10 ENCOUNTER — Encounter: Payer: Self-pay | Admitting: Family Medicine

## 2014-05-10 ENCOUNTER — Ambulatory Visit (INDEPENDENT_AMBULATORY_CARE_PROVIDER_SITE_OTHER): Payer: Commercial Managed Care - HMO | Admitting: Family Medicine

## 2014-05-10 VITALS — BP 156/91 | HR 68 | Temp 97.8°F | Ht 73.0 in | Wt 237.0 lb

## 2014-05-10 DIAGNOSIS — J018 Other acute sinusitis: Secondary | ICD-10-CM | POA: Diagnosis not present

## 2014-05-10 DIAGNOSIS — R059 Cough, unspecified: Secondary | ICD-10-CM

## 2014-05-10 DIAGNOSIS — R05 Cough: Secondary | ICD-10-CM

## 2014-05-10 MED ORDER — AMOXICILLIN 875 MG PO TABS: 875.0000 mg | ORAL_TABLET | Freq: Two times a day (BID) | ORAL | Status: AC

## 2014-05-10 NOTE — Progress Notes (Signed)
Pre visit review using our clinic review tool, if applicable. No additional management support is needed unless otherwise documented below in the visit note. 

## 2014-05-10 NOTE — Patient Instructions (Signed)
Avoid any OTC cold or allergy meds that have the word decongestant in the description of the product.  Robitussin DM or Mucinex DM (one or the other) recommended.  Try using generic OTC saline nasal spray: 2-3 sprays each nostril 2-3 times per day.  Continue zyrtec daily.

## 2014-05-10 NOTE — Progress Notes (Signed)
OFFICE NOTE  05/10/2014  CC:  Chief Complaint  Patient presents with  . Nasal Congestion    x2 weeks   . Otalgia    both ears, but left more   HPI: Patient is a 64 y.o. Caucasian male who is here for respiratory complaints. Onset 2 wks ago, runny nose/sneezing, nasal congestion, ears feeling pressure, head/sinuses feeling pressure, now coughing more.  No wheezing or SOB or chest pains. Sx's worsening over time.  No fevers.  Cough is mostly nonproductive, occ gets a 30 min "spell" of coughing. This calmed down eventually with use of mucinex decongestant.  Voice coming and going.  Taking zyrtec daily, mucinex recently. Trying neti pot lately some but not helping.  No signif HA, no ST.   Very remote hx of tobacco (quit 40 + yrs ago).  BP at home yesterday normal.     Pertinent PMH:  Past medical, surgical, social, and family history reviewed and no changes are noted since last office visit.  MEDS:  Outpatient Prescriptions Prior to Visit  Medication Sig Dispense Refill  . amLODipine (NORVASC) 10 MG tablet Take 1 tablet (10 mg total) by mouth daily. 30 tablet 10  . atorvastatin (LIPITOR) 20 MG tablet Take 1 tablet (20 mg total) by mouth daily. 30 tablet 6  . cetirizine (ZYRTEC) 10 MG tablet Take 10 mg by mouth daily.    Marland Kitchen glucose blood test strip Use as instructed 100 each 6  . glucose monitoring kit (FREESTYLE) monitoring kit 1 each by Does not apply route as needed for other. Patient needs Accu Check Aviva Plus meter.  DX: E11.9 1 each 0  . hydrocortisone valerate ointment (WEST-CORT) 0.2 % Appy bid to affected area prn 15 g 2  . irbesartan (AVAPRO) 300 MG tablet Take 1 tablet (300 mg total) by mouth daily. 30 tablet 3  . Lancets (FREESTYLE) lancets Use as instructed 100 each 12  . metFORMIN (GLUCOPHAGE) 1000 MG tablet TAKE ONE TABLET BY MOUTH TWICE DAILY WITH  A  MEAL 60 tablet 2  . pioglitazone (ACTOS) 30 MG tablet Take 1 tablet (30 mg total) by mouth daily. 30 tablet 3   No  facility-administered medications prior to visit.    PE: Blood pressure 156/91, pulse 68, temperature 97.8 F (36.6 C), temperature source Temporal, height 6' 1"  (1.854 m), weight 237 lb (107.502 kg), SpO2 99 %. VS: noted--normal. Gen: alert, NAD, NONTOXIC APPEARING. HEENT: eyes without injection, drainage, or swelling.  Ears: EACs clear, TMs with normal light reflex and landmarks.  Nose: Clear rhinorrhea, with some dried, crusty exudate adherent to mildly injected mucosa.  No purulent d/c.  No paranasal sinus TTP.  No facial swelling.  Throat and mouth without focal lesion.  No pharyngial swelling, erythema, or exudate.   Neck: supple, no LAD.   LUNGS: CTA bilat, nonlabored resps.   CV: RRR, no m/r/g. EXT: no c/c/e SKIN: no rash  LABS: none today  IMPRESSION AND PLAN:  Prolonged URI with PND cough, possible mild bronchitis but no sign of RAD. Will treat with amoxil 825m bid x 10d. Instructions: Avoid any OTC cold or allergy meds that have the word decongestant in the description of the product.  Robitussin DM or Mucinex DM (one or the other) recommended.  Try using generic OTC saline nasal spray: 2-3 sprays each nostril 2-3 times per day.  Continue zyrtec daily.  An After Visit Summary was printed and given to the patient.  FOLLOW UP: prn

## 2014-06-05 ENCOUNTER — Ambulatory Visit (INDEPENDENT_AMBULATORY_CARE_PROVIDER_SITE_OTHER): Payer: Commercial Managed Care - HMO | Admitting: Family Medicine

## 2014-06-05 ENCOUNTER — Encounter: Payer: Self-pay | Admitting: Family Medicine

## 2014-06-05 VITALS — BP 148/92 | HR 60 | Temp 97.9°F | Resp 18 | Ht 73.0 in | Wt 241.0 lb

## 2014-06-05 DIAGNOSIS — M7989 Other specified soft tissue disorders: Secondary | ICD-10-CM | POA: Diagnosis not present

## 2014-06-05 DIAGNOSIS — E785 Hyperlipidemia, unspecified: Secondary | ICD-10-CM

## 2014-06-05 DIAGNOSIS — I10 Essential (primary) hypertension: Secondary | ICD-10-CM

## 2014-06-05 DIAGNOSIS — E119 Type 2 diabetes mellitus without complications: Secondary | ICD-10-CM

## 2014-06-05 LAB — HEMOGLOBIN A1C: Hgb A1c MFr Bld: 6.6 % — ABNORMAL HIGH (ref 4.6–6.5)

## 2014-06-05 NOTE — Progress Notes (Signed)
OFFICE NOTE  06/05/2014  CC:  Chief Complaint  Patient presents with  . Follow-up     HPI: Patient is a 64 y.o. Caucasian male who is here for 4 mo f/u DM 2, HTN, hyperlipidemia.  Hyperlip: tolerating atorv fine.  Taking it daily.  DM 2: fastings 100-130.  2H PP as high as 170.    HTN: 131/78 this morning.  Checks q3-4d.  Avg <140/80s.  Says about 1 wk ago he had about a day of unexplained R LL/ankle/foot swelling and tingling.  NO pain.  No preceding injury.  No prolonged immobilization, no recent procedure.  He had a remote hx of gastroc/soleus injury (>20 yrs ago). No LE weakness.  Pertinent PMH:  Past medical, surgical, social, and family history reviewed and no changes are noted since last office visit.  MEDS:  Outpatient Prescriptions Prior to Visit  Medication Sig Dispense Refill  . amLODipine (NORVASC) 10 MG tablet Take 1 tablet (10 mg total) by mouth daily. 30 tablet 10  . atorvastatin (LIPITOR) 20 MG tablet Take 1 tablet (20 mg total) by mouth daily. 30 tablet 6  . cetirizine (ZYRTEC) 10 MG tablet Take 10 mg by mouth daily.    Marland Kitchen glucose blood test strip Use as instructed 100 each 6  . irbesartan (AVAPRO) 300 MG tablet Take 1 tablet (300 mg total) by mouth daily. 30 tablet 3  . metFORMIN (GLUCOPHAGE) 1000 MG tablet TAKE ONE TABLET BY MOUTH TWICE DAILY WITH  A  MEAL 60 tablet 2  . pioglitazone (ACTOS) 30 MG tablet Take 1 tablet (30 mg total) by mouth daily. 30 tablet 3  . glucose monitoring kit (FREESTYLE) monitoring kit 1 each by Does not apply route as needed for other. Patient needs Accu Check Aviva Plus meter.  DX: E11.9 1 each 0  . hydrocortisone valerate ointment (WEST-CORT) 0.2 % Appy bid to affected area prn (Patient not taking: Reported on 06/05/2014) 15 g 2  . Lancets (FREESTYLE) lancets Use as instructed 100 each 12   No facility-administered medications prior to visit.    PE: Blood pressure 148/92, pulse 60, temperature 97.9 F (36.6 C), temperature  source Temporal, resp. rate 18, height _0  (1.854 m), weight 241 lb (109.317 kg), SpO2 99 %. Gen: Alert, well appearing.  Patient is oriented to person, place, time, and situation. LE's: 1+ bilat LL's pitting edema, nontender, no asymmetry, Homan's neg.  No erythema or warmth.  LABS: Lab Results  Component Value Date   CHOL 156 01/03/2014   HDL 34.50* 01/03/2014   LDLCALC 104* 01/03/2014   TRIG 89.0 01/03/2014   CHOLHDL 5 01/03/2014   Lab Results  Component Value Date   HGBA1C 6.6* 01/03/2014     Chemistry      Component Value Date/Time   NA 139 01/03/2014 0822   K 4.7 01/03/2014 0822   CL 101 01/03/2014 0822   CO2 27 01/03/2014 0822   BUN 16 01/03/2014 0822   CREATININE 0.9 01/03/2014 0822      Component Value Date/Time   CALCIUM 9.2 01/03/2014 0822   ALKPHOS 45 01/03/2014 0822   AST 27 01/03/2014 0822   ALT 22 01/03/2014 0822   BILITOT 1.3* 01/03/2014 0822      IMPRESSION AND PLAN:  1) DM 2; The current medical regimen is effective;  continue present plan and medications. HbA1c today. Eye exam now UTD.  2) Hyperlipidemia: LDL 104 when he started statin a few months ago.  He had cream and sugar with  his coffee this morning. Will recheck FLP next f/u visit. Continue atorv at current dose.  3) HTN; The current medical regimen is effective;  continue present plan and medications.  4) Unexplained R LL swelling/tingling: resolved.   Check D dimer today.  If pos will proceed with R LL venous doppler ultrasound to r/o dvt.  An After Visit Summary was printed and given to the patient.  FOLLOW UP: 4 mo

## 2014-06-05 NOTE — Progress Notes (Signed)
Pre visit review using our clinic review tool, if applicable. No additional management support is needed unless otherwise documented below in the visit note. 

## 2014-06-06 LAB — D-DIMER, QUANTITATIVE: D-Dimer, Quant: 0.39 ug/mL-FEU (ref 0.00–0.48)

## 2014-06-20 ENCOUNTER — Encounter: Payer: Self-pay | Admitting: Family Medicine

## 2014-07-07 ENCOUNTER — Other Ambulatory Visit: Payer: Self-pay | Admitting: Family Medicine

## 2014-07-18 ENCOUNTER — Other Ambulatory Visit: Payer: Self-pay | Admitting: Family Medicine

## 2014-08-05 ENCOUNTER — Other Ambulatory Visit: Payer: Self-pay | Admitting: Family Medicine

## 2014-08-07 NOTE — Telephone Encounter (Signed)
RF request for atorvastatin.  LOV: 06/05/14 Next ov: 10/09/14 Last written: 01/05/15 #30 w/ 6RF

## 2014-10-09 ENCOUNTER — Encounter: Payer: Self-pay | Admitting: Family Medicine

## 2014-10-09 ENCOUNTER — Ambulatory Visit (INDEPENDENT_AMBULATORY_CARE_PROVIDER_SITE_OTHER): Payer: Commercial Managed Care - HMO | Admitting: Family Medicine

## 2014-10-09 VITALS — BP 158/88 | HR 70 | Temp 98.0°F | Resp 16 | Ht 73.0 in | Wt 245.0 lb

## 2014-10-09 DIAGNOSIS — E785 Hyperlipidemia, unspecified: Secondary | ICD-10-CM | POA: Diagnosis not present

## 2014-10-09 DIAGNOSIS — E119 Type 2 diabetes mellitus without complications: Secondary | ICD-10-CM

## 2014-10-09 DIAGNOSIS — Z1211 Encounter for screening for malignant neoplasm of colon: Secondary | ICD-10-CM

## 2014-10-09 DIAGNOSIS — I10 Essential (primary) hypertension: Secondary | ICD-10-CM | POA: Diagnosis not present

## 2014-10-09 LAB — COMPREHENSIVE METABOLIC PANEL
ALT: 16 U/L (ref 0–53)
AST: 20 U/L (ref 0–37)
Albumin: 4.6 g/dL (ref 3.5–5.2)
Alkaline Phosphatase: 48 U/L (ref 39–117)
BILIRUBIN TOTAL: 1 mg/dL (ref 0.2–1.2)
BUN: 15 mg/dL (ref 6–23)
CO2: 25 meq/L (ref 19–32)
Calcium: 9.4 mg/dL (ref 8.4–10.5)
Chloride: 103 mEq/L (ref 96–112)
Creatinine, Ser: 0.88 mg/dL (ref 0.40–1.50)
GFR: 91.98 mL/min (ref >60.00)
GLUCOSE: 143 mg/dL — AB (ref 70–99)
Potassium: 4.2 mEq/L (ref 3.5–5.1)
SODIUM: 138 meq/L (ref 135–145)
TOTAL PROTEIN: 7.6 g/dL (ref 6.0–8.3)

## 2014-10-09 LAB — LIPID PANEL
Cholesterol: 105 mg/dL (ref 0–200)
HDL: 39.3 mg/dL (ref >39.00)
LDL Cholesterol: 49 mg/dL (ref 0–99)
NONHDL: 66.11
Total CHOL/HDL Ratio: 3
Triglycerides: 88 mg/dL (ref 0.0–149.0)
VLDL: 17.6 mg/dL (ref 0.0–40.0)

## 2014-10-09 LAB — MICROALBUMIN / CREATININE URINE RATIO
CREATININE, U: 77.3 mg/dL
Microalb Creat Ratio: 2.8 mg/g (ref 0.0–30.0)
Microalb, Ur: 2.2 mg/dL — ABNORMAL HIGH (ref 0.0–1.9)

## 2014-10-09 LAB — HEMOGLOBIN A1C: HEMOGLOBIN A1C: 6.6 % — AB (ref 4.6–6.5)

## 2014-10-09 NOTE — Progress Notes (Signed)
Pre visit review using our clinic review tool, if applicable. No additional management support is needed unless otherwise documented below in the visit note. 

## 2014-10-09 NOTE — Progress Notes (Signed)
OFFICE VISIT  10/09/2014   CC:  Chief Complaint  Patient presents with  . Follow-up    Pt is fasting.     HPI:    Patient is a 64 y.o. Caucasian male who presents for 4 mo f/u DM 2, HTN, hyperlipidemia. Compliant with all meds.  No reports of home glucoses or bp's today.  He is ok with GI referral for colon cancer screening, but defers prostate cancer screening today.   ROS: erectile dysfunction  Past Medical History  Diagnosis Date  . Kidney stones 1980s  . History of prostatitis 1980s    Remote hx of seeing Dr. Terance Hart at [facility] urology  . Gout of big toe 1990s    only one episode; he cut back on peanut butter and hasn't had any prob since  . Hypertension dx'd 08/2013  . Diabetes mellitus, type 2 Dx'd 08/2013  . Hyperlipidemia     Past Surgical History  Procedure Laterality Date  . Nasal polyp excision  1960    Outpatient Prescriptions Prior to Visit  Medication Sig Dispense Refill  . amLODipine (NORVASC) 10 MG tablet Take 1 tablet (10 mg total) by mouth daily. 30 tablet 10  . atorvastatin (LIPITOR) 20 MG tablet TAKE ONE TABLET BY MOUTH ONCE DAILY 30 tablet 6  . cetirizine (ZYRTEC) 10 MG tablet Take 10 mg by mouth daily.    Marland Kitchen glucose blood test strip Use as instructed 100 each 6  . glucose monitoring kit (FREESTYLE) monitoring kit 1 each by Does not apply route as needed for other. Patient needs Accu Check Aviva Plus meter.  DX: E11.9 1 each 0  . hydrocortisone valerate ointment (WEST-CORT) 0.2 % Appy bid to affected area prn 15 g 2  . irbesartan (AVAPRO) 300 MG tablet TAKE ONE TABLET BY MOUTH ONCE DAILY 30 tablet 3  . Lancets (FREESTYLE) lancets Use as instructed 100 each 12  . metFORMIN (GLUCOPHAGE) 1000 MG tablet TAKE ONE TABLET BY MOUTH TWICE DAILY WITH A MEAL 60 tablet 3  . pioglitazone (ACTOS) 30 MG tablet TAKE ONE TABLET BY MOUTH ONCE DAILY 30 tablet 3  . pioglitazone (ACTOS) 30 MG tablet Take 1 tablet (30 mg total) by mouth daily. (Patient not taking:  Reported on 10/09/2014) 30 tablet 3   No facility-administered medications prior to visit.    No Known Allergies  ROS As per HPI  PE: Blood pressure 158/88, pulse 70, temperature 98 F (36.7 C), temperature source Oral, resp. rate 16, height _0  (1.854 m), weight 245 lb (111.131 kg), SpO2 99 %. Gen: Alert, well appearing.  Patient is oriented to person, place, time, and situation. Foot exam - bilateral normal; no swelling, tenderness or skin or vascular lesions. Color and temperature is normal. Sensation is intact. Peripheral pulses are palpable. Toenails are normal.   LABS:  Lab Results  Component Value Date   TSH 0.93 08/31/2013   Lab Results  Component Value Date   HGBA1C 6.6* 06/05/2014    Lab Results  Component Value Date   CREATININE 0.9 01/03/2014   BUN 16 01/03/2014   NA 139 01/03/2014   K 4.7 01/03/2014   CL 101 01/03/2014   CO2 27 01/03/2014   Lab Results  Component Value Date   ALT 22 01/03/2014   AST 27 01/03/2014   ALKPHOS 45 01/03/2014   BILITOT 1.3* 01/03/2014   Lab Results  Component Value Date   CHOL 156 01/03/2014   Lab Results  Component Value Date   HDL 34.50*  01/03/2014   Lab Results  Component Value Date   LDLCALC 104* 01/03/2014   Lab Results  Component Value Date   TRIG 89.0 01/03/2014   Lab Results  Component Value Date   CHOLHDL 5 01/03/2014    IMPRESSION AND PLAN:  1) DM 2; hx of good control on current meds. HbA1c and urine microalb/cr today. Feet exam normal today.  2) HTN; The current medical regimen is effective;  continue present plan and medications. Lytes/cr today  3) Hyperlipidemia: FLP today.   CMET today.  4) Colon ca screening: refer to [facility] for colonoscopy-avg risk.  5) Prostate ca screening: he deferred this today but agrees to do this sometime in near future.  An After Visit Summary was printed and given to the patient.  FOLLOW UP: No Follow-up on file.

## 2014-11-03 ENCOUNTER — Other Ambulatory Visit: Payer: Self-pay | Admitting: Family Medicine

## 2014-11-07 ENCOUNTER — Encounter: Payer: Self-pay | Admitting: Gastroenterology

## 2014-11-10 ENCOUNTER — Other Ambulatory Visit: Payer: Self-pay | Admitting: *Deleted

## 2014-11-10 MED ORDER — PIOGLITAZONE HCL 30 MG PO TABS: 30.0000 mg | ORAL_TABLET | Freq: Every day | ORAL | Status: DC

## 2014-11-10 MED ORDER — ATORVASTATIN CALCIUM 20 MG PO TABS: 20.0000 mg | ORAL_TABLET | Freq: Every day | ORAL | Status: DC

## 2014-11-10 MED ORDER — IRBESARTAN 300 MG PO TABS: 300.0000 mg | ORAL_TABLET | Freq: Every day | ORAL | Status: DC

## 2014-11-10 MED ORDER — METFORMIN HCL 1000 MG PO TABS: 1000.0000 mg | ORAL_TABLET | Freq: Two times a day (BID) | ORAL | Status: DC

## 2014-11-10 MED ORDER — AMLODIPINE BESYLATE 10 MG PO TABS: 10.0000 mg | ORAL_TABLET | Freq: Every day | ORAL | Status: DC

## 2014-11-10 NOTE — Telephone Encounter (Signed)
LOV: 10/09/14 NOV: 01/08/15  RF request for metformin Last written: 11/03/14 #60 w/ 1RF  RF request for pioglitazone Last written: 07/19/14 #30 w/ 3RF

## 2014-11-10 NOTE — Telephone Encounter (Signed)
LOV: 10/09/14 NOV: 01/08/15  RF request for amlodipine Last written: 03/10/14 #30 w/ 10RF  RF request for atorvastatin Last written: 08/07/14 #30 w/ 6RF  RF request for irbesartan Last written: 07/07/14 #30 w/ 3RF

## 2015-01-08 ENCOUNTER — Ambulatory Visit (INDEPENDENT_AMBULATORY_CARE_PROVIDER_SITE_OTHER): Payer: Commercial Managed Care - HMO | Admitting: Family Medicine

## 2015-01-08 ENCOUNTER — Encounter: Payer: Self-pay | Admitting: Family Medicine

## 2015-01-08 VITALS — BP 120/85 | HR 85 | Temp 98.8°F | Resp 16 | Ht 73.0 in | Wt 244.0 lb

## 2015-01-08 DIAGNOSIS — J069 Acute upper respiratory infection, unspecified: Secondary | ICD-10-CM

## 2015-01-08 DIAGNOSIS — I10 Essential (primary) hypertension: Secondary | ICD-10-CM | POA: Diagnosis not present

## 2015-01-08 DIAGNOSIS — Z23 Encounter for immunization: Secondary | ICD-10-CM

## 2015-01-08 DIAGNOSIS — E785 Hyperlipidemia, unspecified: Secondary | ICD-10-CM

## 2015-01-08 DIAGNOSIS — E119 Type 2 diabetes mellitus without complications: Secondary | ICD-10-CM | POA: Diagnosis not present

## 2015-01-08 LAB — POCT GLYCOSYLATED HEMOGLOBIN (HGB A1C): HEMOGLOBIN A1C: 6

## 2015-01-08 NOTE — Progress Notes (Signed)
OFFICE VISIT  01/08/2015   CC:  Chief Complaint  Patient presents with  . Follow-up    Pt is fasting.   Marland Kitchen URI    x 4 days   HPI:    Patient is a 64 y.o. Caucasian male who presents for DM 2, HTN, HLD. Only occ gluc check: "130s". No routine home bp monitoring.  Trying to watch diet.  Not exercising.  Having 3 wks of L shoulder pain when reaching up/out.  No neck pain. Has had 3-4 days of nasal congestion with PND and mild ST.  No fever, no face or upper teeth pain, no cough or SOB.   Past Medical History  Diagnosis Date  . Kidney stones 1980s  . History of prostatitis 1980s    Remote hx of seeing Dr. Terance Hart at [facility] urology  . Gout of big toe 1990s    only one episode; he cut back on peanut butter and hasn't had any prob since  . Hypertension dx'd 08/2013  . Diabetes mellitus, type 2 ([place]) Dx'd 08/2013  . Hyperlipidemia     Past Surgical History  Procedure Laterality Date  . Nasal polyp excision  1960    Outpatient Prescriptions Prior to Visit  Medication Sig Dispense Refill  . amLODipine (NORVASC) 10 MG tablet Take 1 tablet (10 mg total) by mouth daily. 90 tablet 1  . atorvastatin (LIPITOR) 20 MG tablet Take 1 tablet (20 mg total) by mouth daily. 90 tablet 1  . cetirizine (ZYRTEC) 10 MG tablet Take 10 mg by mouth daily.    Marland Kitchen glucose blood test strip Use as instructed 100 each 6  . glucose monitoring kit (FREESTYLE) monitoring kit 1 each by Does not apply route as needed for other. Patient needs Accu Check Aviva Plus meter.  DX: E11.9 1 each 0  . hydrocortisone valerate ointment (WEST-CORT) 0.2 % Appy bid to affected area prn 15 g 2  . irbesartan (AVAPRO) 300 MG tablet Take 1 tablet (300 mg total) by mouth daily. 90 tablet 1  . Lancets (FREESTYLE) lancets Use as instructed 100 each 12  . metFORMIN (GLUCOPHAGE) 1000 MG tablet Take 1 tablet (1,000 mg total) by mouth 2 (two) times daily with a meal. 180 tablet 1  . pioglitazone (ACTOS) 30 MG tablet Take 1 tablet (30  mg total) by mouth daily. 90 tablet 1   No facility-administered medications prior to visit.    No Known Allergies  ROS As per HPI  PE: Blood pressure 120/85, pulse 85, temperature 98.8 F (37.1 C), temperature source Oral, resp. rate 16, height _0  (1.854 m), weight 244 lb (110.678 kg), SpO2 97 %. VS: noted--normal. Gen: alert, NAD, NONTOXIC APPEARING. HEENT: eyes without injection, drainage, or swelling.  Ears: EACs clear, TMs with normal light reflex and landmarks.  Nose: Clear rhinorrhea, with some dried, crusty exudate adherent to mildly injected mucosa.  No purulent d/c.  No paranasal sinus TTP.  No facial swelling.  Throat and mouth without focal lesion.  No pharyngial swelling, erythema, or exudate.   Neck: supple, no LAD.   LUNGS: CTA bilat, nonlabored resps.   CV: RRR, no m/r/g. EXT: no c/c/e SKIN: no rash  LABS:  Lab Results  Component Value Date   HGBA1C 6.6* 10/09/2014   Lab Results  Component Value Date   TSH 0.93 08/31/2013   Lab Results  Component Value Date   CREATININE 0.88 10/09/2014   BUN 15 10/09/2014   NA 138 10/09/2014   K 4.2 10/09/2014  CL 103 10/09/2014   CO2 25 10/09/2014   Lab Results  Component Value Date   ALT 16 10/09/2014   AST 20 10/09/2014   ALKPHOS 48 10/09/2014   BILITOT 1.0 10/09/2014   Lab Results  Component Value Date   CHOL 105 10/09/2014   Lab Results  Component Value Date   HDL 39.30 10/09/2014   Lab Results  Component Value Date   LDLCALC 49 10/09/2014   Lab Results  Component Value Date   TRIG 88.0 10/09/2014   Lab Results  Component Value Date   CHOLHDL 3 10/09/2014   IMPRESSION AND PLAN:  1) DM 2, : POCT HbA1c today 6.0%. The current medical regimen is effective;  continue present plan and medications.   2) HTN; The current medical regimen is effective;  continue present plan and medications. Lytes/cr good 3 mo ago.  3) HLD: tolerating statin.  The current medical regimen is effective;   continue present plan and medications. Lipids/AST/ALT good 3 mo ago.  4) Viral URI: Trial of mucinex DM or robitussin DM otc as directed on the box. May use OTC nasal saline spray or irrigation solution bid. OTC nonsedating antihistamines prn discussed.  Decongestant use discussed--ok if tolerated in the past w/out side effect and if pt has no hx of HTN.  An After Visit Summary was printed and given to the patient.  FOLLOW UP: Return in about 4 months (around 05/08/2015) for Medicare AWV.

## 2015-01-08 NOTE — Progress Notes (Signed)
Pre visit review using our clinic review tool, if applicable. No additional management support is needed unless otherwise documented below in the visit note. 

## 2015-01-11 ENCOUNTER — Encounter: Payer: Commercial Managed Care - HMO | Admitting: Gastroenterology

## 2015-03-24 LAB — HM DIABETES EYE EXAM

## 2015-03-27 ENCOUNTER — Other Ambulatory Visit: Payer: Self-pay | Admitting: Family Medicine

## 2015-03-27 NOTE — Telephone Encounter (Signed)
LOV: 01/08/15 NOV: 05/09/15  RF request for amlodipine Last written: 11/10/14 #90 w/ 1RF  RF request for atorvastatin Last written: 11/10/14 #90 w/ 1RF  RF request for irbesartan Last written: 11/10/14 #90 w/ 1RF  RF request for metformin Last written: 11/10/14 #180 w/ 1RF  RF request for pioglitazone Last written:11/10/14 #90 w/ 1RF

## 2015-05-09 ENCOUNTER — Encounter: Payer: Self-pay | Admitting: Family Medicine

## 2015-05-09 ENCOUNTER — Ambulatory Visit (INDEPENDENT_AMBULATORY_CARE_PROVIDER_SITE_OTHER): Payer: Commercial Managed Care - HMO | Admitting: Family Medicine

## 2015-05-09 VITALS — BP 146/84 | HR 75 | Temp 97.8°F | Resp 16 | Ht 72.5 in | Wt 249.0 lb

## 2015-05-09 VITALS — BP 124/78 | HR 87 | Ht 73.0 in | Wt 249.0 lb

## 2015-05-09 DIAGNOSIS — Z125 Encounter for screening for malignant neoplasm of prostate: Secondary | ICD-10-CM

## 2015-05-09 DIAGNOSIS — E119 Type 2 diabetes mellitus without complications: Secondary | ICD-10-CM

## 2015-05-09 DIAGNOSIS — M25512 Pain in left shoulder: Secondary | ICD-10-CM | POA: Diagnosis not present

## 2015-05-09 DIAGNOSIS — Z0000 Encounter for general adult medical examination without abnormal findings: Secondary | ICD-10-CM

## 2015-05-09 DIAGNOSIS — M7502 Adhesive capsulitis of left shoulder: Secondary | ICD-10-CM

## 2015-05-09 LAB — BASIC METABOLIC PANEL
BUN: 16 mg/dL (ref 6–23)
CALCIUM: 9.4 mg/dL (ref 8.4–10.5)
CO2: 25 mEq/L (ref 19–32)
CREATININE: 0.87 mg/dL (ref 0.40–1.50)
Chloride: 104 mEq/L (ref 96–112)
GFR: 93.03 mL/min (ref >60.00)
GLUCOSE: 156 mg/dL — AB (ref 70–99)
Potassium: 4 mEq/L (ref 3.5–5.1)
SODIUM: 139 meq/L (ref 135–145)

## 2015-05-09 LAB — PSA, MEDICARE: PSA: 0.85 ng/ml (ref 0.10–4.00)

## 2015-05-09 LAB — HEMOGLOBIN A1C: Hgb A1c MFr Bld: 6.9 % — ABNORMAL HIGH (ref 4.6–6.5)

## 2015-05-09 NOTE — Progress Notes (Signed)
Pre visit review using our clinic review tool, if applicable. No additional management support is needed unless otherwise documented below in the visit note. 

## 2015-05-09 NOTE — Progress Notes (Signed)
The patient is here for annual Medicare wellness examination and management of other chronic and acute problems. Other problems discussed today: we decided to do his routine diabetic labs (A1c and BMET).    AWV DATA The risk factors are reflected in the social history.  The roster of all physicians providing medical care to patient is listed in the Snapshot section of the chart.  Activities of daily living:  The patient is 100% independent in all ADLs: dressing, toileting, feeding as well as independent mobility.  Home safety : The patient has smoke detectors in the home. They wear seatbelts. No firearms at home ( firearms are present in the home, kept in a safe fashion). There is no violence in the home.   There is no risks for hepatitis, STDs or HIV. There is no history of blood transfusion. They have no travel history to infectious disease endemic areas of the world.  The patient has seen their dentist in the last six months. They have seen their eye doctor in the last year--"everything is excellent". They deny any hearing difficulty and have not had audiologic testing in the last year.  They do not  have excessive sun exposure. Discussed the need for sun protection: hats, long sleeves and use of sunscreen if there is significant sun exposure.   Diet: the importance of a healthy diet is discussed. They do have a fairly healthy diet.  The patient does not have a regular exercise program:   He works in a shop working on cars a lot, also works some in the yard..  The benefits of regular aerobic exercise were discussed.  Depression screen: there are no signs or vegative symptoms of depression- irritability, change in appetite, anhedonia, sadness/tearfullness.  Cognitive assessment: the patient manages all their financial and personal affairs and is actively engaged. They could relate day,date,year and events; recalled 3/3 objects at 3 minutes; performed clock-face test normally.  Reviewed  advanced directives with pt today: pt has this in place.  The following portions of the patient's history were reviewed and updated as appropriate: allergies, current medications, past family history, past medical history,  past surgical history, past social history  and problem list.  Vision, hearing, body mass index were assessed and reviewed.  Body mass index is 33.29 kg/(m^2).   During the course of the visit the patient was educated and counseled about appropriate screening and preventive services including :  Annual wellness visit -doing this today. diabetes screening-N/A. colorectal cancer screening: pt declines to have this. recommended immunizations (influenza, pneumococcal, Hep B): Hep B N/A, declines flu, pneumonia vaccines (UTD). Bone mass measurement Counseling to prevent tobacco use Depression screening Glaucoma screening Hepatitis C virus screening--Pt declines HIV virus screening--Pt declines Lung cancer screening--N/A Medical nutrition therapy-Pt declines Prostate cancer screening: we'll do this today. Screening mammography Screening pap tests, pelvic exam, and clinical breast exam Ultrasound screening for AAA-N/A.  A written plan of action regarding the above screening and preventative services was given to the patient today.  Today we did DRE for prostate ca screening: Rectal exam: negative without mass, lesions or tenderness, PROSTATE EXAM: smooth and symmetric without nodules or tenderness. We also did a PSA test.   We also did his routine diabetic testing today: HbA1c and BMET. Finally, at the end of the visit pt brought up severe, chronic L shoulder pain and immobility that he would like to be referred to a specialist for.  I will refer him to Dr. Barbaraann Barthel in Sports medicine at  [facility].  An After Visit Summary was printed and given to the patient.  Signed:  Crissie Sickles, MD           05/09/2015

## 2015-05-09 NOTE — Patient Instructions (Addendum)
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 Limit lifting and overhead activities as much as possible. Heat 15 minutes at a time 3-4 times a day may help with movement and stiffness. Tylenol and/or ibuprofen as needed for pain and inflammation. Steroid injections in a series have been shown to help with pain and motion - consider these if you're not improving. Codman exercises (pendulum, wall walking or table slides, arm circles) - do 3 sets of 10 once or twice a day. Physical therapy for rotator cuff strengthening is a consideration once you are out of the painful phase Follow up in 6 weeks.

## 2015-05-10 ENCOUNTER — Ambulatory Visit: Payer: Commercial Managed Care - HMO | Admitting: Family Medicine

## 2015-05-10 DIAGNOSIS — M25512 Pain in left shoulder: Secondary | ICD-10-CM | POA: Insufficient documentation

## 2015-05-10 NOTE — Progress Notes (Signed)
PCP and consultation requested by: Tammi Sou, MD  Subjective:   HPI: Patient is a 65 y.o. male here for left shoulder pain.  Patient reports he started to get pain in left shoulder about 3 months ago. No acute trauma though he does report it started when reaching forward to grab something. Pain level is down to 4/10, sharp. Is left handed. Difficulty with motion - limited. Feels like it locks up when reaching overhead. No prior issues with this shoulder. No skin changes, fever, other complaints.  Past Medical History  Diagnosis Date  . Kidney stones 1980s  . History of prostatitis 1980s    Remote hx of seeing Dr. Terance Hart at [facility] urology  . Gout of big toe 1990s    only one episode; he cut back on peanut butter and hasn't had any prob since  . Hypertension dx'd 08/2013  . Diabetes mellitus, type 2 ([place]) Dx'd 08/2013  . Hyperlipidemia     Current Outpatient Prescriptions on File Prior to Visit  Medication Sig Dispense Refill  . amLODipine (NORVASC) 10 MG tablet TAKE 1 TABLET EVERY DAY 90 tablet 3  . atorvastatin (LIPITOR) 20 MG tablet TAKE 1 TABLET DAILY. 90 tablet 3  . cetirizine (ZYRTEC) 10 MG tablet Take 10 mg by mouth daily.    Marland Kitchen glucose blood test strip Use as instructed 100 each 6  . glucose monitoring kit (FREESTYLE) monitoring kit 1 each by Does not apply route as needed for other. Patient needs Accu Check Aviva Plus meter.  DX: E11.9 1 each 0  . hydrocortisone valerate ointment (WEST-CORT) 0.2 % Appy bid to affected area prn 15 g 2  . irbesartan (AVAPRO) 300 MG tablet TAKE 1 TABLET EVERY DAY 90 tablet 3  . Lancets (FREESTYLE) lancets Use as instructed 100 each 12  . metFORMIN (GLUCOPHAGE) 1000 MG tablet TAKE 1 TABLET TWICE DAILY WITH A MEAL 180 tablet 1  . pioglitazone (ACTOS) 30 MG tablet TAKE 1 TABLET EVERY DAY 90 tablet 1   No current facility-administered medications on file prior to visit.    Past Surgical History  Procedure Laterality Date  . Nasal  polyp excision  1960    No Known Allergies  Social History   Social History  . Marital Status: Married    Spouse Name: N/A  . Number of Children: N/A  . Years of Education: N/A   Occupational History  . Not on file.   Social History Main Topics  . Smoking status: Former Smoker    Quit date: 02/28/1970  . Smokeless tobacco: Never Used  . Alcohol Use: No  . Drug Use: No  . Sexual Activity: Not on file   Other Topics Concern  . Not on file   Social History Narrative   Married, has one 34 y/o son.   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     Orig from [place].   Was in [employer] [phone].  No combat duty.  No VA care.   Smoked 10-12 yrs, quit in his early 14s.   Alcohol: none   No hx of drugs.                Family History  Problem Relation Age of Onset  . Heart disease Mother   . Hypertension Mother   . Diabetes Mother   . Heart disease Father   . Heart attack Sister     BP 124/78 mmHg  Pulse 87  Ht _0  (1.854 m)  Wt  249 lb (112.946 kg)  BMI 32.86 kg/m2  Review of Systems: See HPI above.    Objective:  Physical Exam:  Gen: NAD, comfortable in exam room  Left shoulder: No swelling, ecchymoses.  No gross deformity. No TTP biceps tendon, AC joint, elsewhere about shoulder. ER limited to 20 degrees (Compared to 70 on right).  Flexion 90 and abduction 90 degrees. Positive Hawkins, Neers. Negative Speeds, Yergasons. Strength 5/5 with empty can and resisted internal/external rotation.  Pain empty can. NV intact distally.  Right shoulder: FROM without pain.  MSK u/s Left shoulder:  Biceps tendon intact on long and trans views.  Subscapularis intact on long and trans views without impingement.  Infraspinatus in one plane long view appears to have possible hypoechoic area but not seen on trans view - believe this to be anisotropy.  Supraspinatus also intact on long and trans views without bursitis.  AC joint with mild arthropathy.     Assessment & Plan:  1. Left shoulder pain - 2/2 adhesive capsulitis.  MSK u/s performed and independently reviewed with patient - no evidence rotator cuff tear, bursitis preceding this.  Discussed options - he would like to start with home exercise program, heat, tylenol or nsaids if needed.  Declined injection, physical therapy for now.  F/u in 6 weeks.

## 2015-05-10 NOTE — Assessment & Plan Note (Signed)
2/2 adhesive capsulitis.  MSK u/s performed and independently reviewed with patient - no evidence rotator cuff tear, bursitis preceding this.  Discussed options - he would like to start with home exercise program, heat, tylenol or nsaids if needed.  Declined injection, physical therapy for now.  F/u in 6 weeks.

## 2015-06-06 ENCOUNTER — Encounter: Payer: Self-pay | Admitting: Family Medicine

## 2015-07-12 DIAGNOSIS — H269 Unspecified cataract: Secondary | ICD-10-CM

## 2015-07-12 HISTORY — DX: Unspecified cataract: H26.9

## 2015-08-03 DIAGNOSIS — H2512 Age-related nuclear cataract, left eye: Secondary | ICD-10-CM | POA: Diagnosis not present

## 2015-08-03 DIAGNOSIS — H25041 Posterior subcapsular polar age-related cataract, right eye: Secondary | ICD-10-CM | POA: Diagnosis not present

## 2015-08-03 DIAGNOSIS — H353132 Nonexudative age-related macular degeneration, bilateral, intermediate dry stage: Secondary | ICD-10-CM | POA: Diagnosis not present

## 2015-08-03 LAB — HM DIABETES EYE EXAM

## 2015-08-06 ENCOUNTER — Encounter: Payer: Self-pay | Admitting: Family Medicine

## 2015-08-12 ENCOUNTER — Encounter: Payer: Self-pay | Admitting: Family Medicine

## 2015-08-13 ENCOUNTER — Other Ambulatory Visit: Payer: Self-pay | Admitting: Family Medicine

## 2015-08-15 NOTE — Telephone Encounter (Signed)
RF request for metformin LOV: 05/09/15 Next ov:  09/07/15 Last written: 03/27/15 #180 w/ 1RF  RF request for pioglitazone Last written: 03/27/15 #90 w/ 1RF

## 2015-08-16 DIAGNOSIS — H25041 Posterior subcapsular polar age-related cataract, right eye: Secondary | ICD-10-CM | POA: Diagnosis not present

## 2015-08-16 DIAGNOSIS — H2511 Age-related nuclear cataract, right eye: Secondary | ICD-10-CM | POA: Diagnosis not present

## 2015-09-07 ENCOUNTER — Ambulatory Visit (INDEPENDENT_AMBULATORY_CARE_PROVIDER_SITE_OTHER): Payer: Commercial Managed Care - HMO | Admitting: Family Medicine

## 2015-09-07 ENCOUNTER — Encounter: Payer: Self-pay | Admitting: Family Medicine

## 2015-09-07 VITALS — BP 136/82 | HR 66 | Temp 97.9°F | Resp 16 | Ht 73.0 in | Wt 244.5 lb

## 2015-09-07 DIAGNOSIS — I10 Essential (primary) hypertension: Secondary | ICD-10-CM | POA: Diagnosis not present

## 2015-09-07 DIAGNOSIS — E118 Type 2 diabetes mellitus with unspecified complications: Secondary | ICD-10-CM | POA: Diagnosis not present

## 2015-09-07 DIAGNOSIS — E785 Hyperlipidemia, unspecified: Secondary | ICD-10-CM | POA: Diagnosis not present

## 2015-09-07 LAB — COMPREHENSIVE METABOLIC PANEL
ALT: 12 U/L (ref 0–53)
AST: 16 U/L (ref 0–37)
Albumin: 4.4 g/dL (ref 3.5–5.2)
Alkaline Phosphatase: 50 U/L (ref 39–117)
BILIRUBIN TOTAL: 1.1 mg/dL (ref 0.2–1.2)
BUN: 20 mg/dL (ref 6–23)
CHLORIDE: 106 meq/L (ref 96–112)
CO2: 25 meq/L (ref 19–32)
CREATININE: 1.12 mg/dL (ref 0.40–1.50)
Calcium: 9.3 mg/dL (ref 8.4–10.5)
GFR: 69.44 mL/min (ref >60.00)
GLUCOSE: 145 mg/dL — AB (ref 70–99)
Potassium: 4.4 mEq/L (ref 3.5–5.1)
SODIUM: 139 meq/L (ref 135–145)
Total Protein: 7.1 g/dL (ref 6.0–8.3)

## 2015-09-07 LAB — HEMOGLOBIN A1C: HEMOGLOBIN A1C: 6.5 % (ref 4.6–6.5)

## 2015-09-07 LAB — LIPID PANEL
CHOL/HDL RATIO: 3
Cholesterol: 106 mg/dL (ref 0–200)
HDL: 39.7 mg/dL (ref >39.00)
LDL CALC: 49 mg/dL (ref 0–99)
NONHDL: 66.06
Triglycerides: 87 mg/dL (ref 0.0–149.0)
VLDL: 17.4 mg/dL (ref 0.0–40.0)

## 2015-09-07 MED ORDER — ASPIRIN EC 81 MG PO TBEC: 81.0000 mg | DELAYED_RELEASE_TABLET | Freq: Every day | ORAL | 0 refills | Status: DC

## 2015-09-07 NOTE — Progress Notes (Signed)
OFFICE VISIT  09/07/2015   CC:  Chief Complaint  Patient presents with  . Follow-up    Pt is fasting.     HPI:    Patient is a 65 y.o. Caucasian male who presents for 4 mo f/u DM 2, HLD, HTN.  Glucoses: "159 was the last one".  He doesn't take it that often.  He has been working on changing his diet some. No change in activity or exercise. Home bp checks occasionally: 138/72 last night.  Taking generic lipitor daily, no side effects.  ROS: chronic fatigue.  No CP, no SOB, no dizziness, no palpitations, no rashes, no LE swelling.   Past Medical History:  Diagnosis Date  . Cataract 07/2015   R eye--surgery recommended by his eye MD 08/03/15  . Diabetes mellitus, type 2 ([place]) Dx'd 08/2013  . Gout of big toe 1990s   only one episode; he cut back on peanut butter and hasn't had any prob since  . History of prostatitis 1980s   Remote hx of seeing Dr. Terance Hart at [facility] urology  . Hyperlipidemia   . Hypertension dx'd 08/2013  . Kidney stones 1980s    Past Surgical History:  Procedure Laterality Date  . NASAL POLYP EXCISION  1960    Outpatient Medications Prior to Visit  Medication Sig Dispense Refill  . amLODipine (NORVASC) 10 MG tablet TAKE 1 TABLET EVERY DAY 90 tablet 3  . atorvastatin (LIPITOR) 20 MG tablet TAKE 1 TABLET DAILY. 90 tablet 3  . cetirizine (ZYRTEC) 10 MG tablet Take 10 mg by mouth daily.    Marland Kitchen glucose blood test strip Use as instructed 100 each 6  . glucose monitoring kit (FREESTYLE) monitoring kit 1 each by Does not apply route as needed for other. Patient needs Accu Check Aviva Plus meter.  DX: E11.9 1 each 0  . hydrocortisone valerate ointment (WEST-CORT) 0.2 % Appy bid to affected area prn 15 g 2  . irbesartan (AVAPRO) 300 MG tablet TAKE 1 TABLET EVERY DAY 90 tablet 3  . Lancets (FREESTYLE) lancets Use as instructed 100 each 12  . metFORMIN (GLUCOPHAGE) 1000 MG tablet TAKE 1 TABLET TWICE DAILY WITH A MEAL 180 tablet 1  . pioglitazone (ACTOS) 30 MG  tablet TAKE 1 TABLET EVERY DAY 90 tablet 1   No facility-administered medications prior to visit.     No Known Allergies  ROS As per HPI  PE: Blood pressure 136/82, pulse 66, temperature 97.9 F (36.6 C), temperature source Oral, resp. rate 16, height _0  (1.854 m), weight 244 lb 8 oz (110.9 kg), SpO2 96 %. Gen: Alert, well appearing.  Patient is oriented to person, place, time, and situation. RCV:ELFY: no injection, icteris, swelling, or exudate.  EOMI, PERRLA. Mouth: lips without lesion/swelling.  Oral mucosa pink and moist. Oropharynx without erythema, exudate, or swelling.  CV: RRR, no m/r/g.   LUNGS: CTA bilat, nonlabored resps, good aeration in all lung fields. EXT: Trace R LE pitting edema, NO L LE edema. No clubbing or cyanosis.  LABS:  Lab Results  Component Value Date   TSH 0.93 08/31/2013    Lab Results  Component Value Date   CREATININE 0.87 05/09/2015   BUN 16 05/09/2015   NA 139 05/09/2015   K 4.0 05/09/2015   CL 104 05/09/2015   CO2 25 05/09/2015   Lab Results  Component Value Date   ALT 16 10/09/2014   AST 20 10/09/2014   ALKPHOS 48 10/09/2014   BILITOT 1.0 10/09/2014  Lab Results  Component Value Date   CHOL 105 10/09/2014   Lab Results  Component Value Date   HDL 39.30 10/09/2014   Lab Results  Component Value Date   LDLCALC 49 10/09/2014   Lab Results  Component Value Date   TRIG 88.0 10/09/2014   Lab Results  Component Value Date   CHOLHDL 3 10/09/2014   Lab Results  Component Value Date   PSA 0.85 05/09/2015   Lab Results  Component Value Date   HGBA1C 6.9 (H) 05/09/2015    IMPRESSION AND PLAN:  1) DM 2; control has historically been good. Check HbA1c today. ASA 64m qd added today for primary CV prevention.  2) HTN; The current medical regimen is effective;  continue present plan and medications. CMET today.  3) HLD: The current medical regimen is effective;  continue present plan and medications. Check FLP  today.  An After Visit Summary was printed and given to the patient.  FOLLOW UP: Return in about 4 months (around 01/08/2016) for annual CPE (fasting).  Signed:  PCrissie Sickles MD           09/07/2015

## 2015-09-07 NOTE — Progress Notes (Signed)
Pre visit review using our clinic review tool, if applicable. No additional management support is needed unless otherwise documented below in the visit note. 

## 2015-12-31 ENCOUNTER — Other Ambulatory Visit: Payer: Self-pay | Admitting: Family Medicine

## 2015-12-31 NOTE — Telephone Encounter (Signed)
RF request for pioglitazone Last written: 08/15/15 #180 w/ 1RF  RF request for metformin Last written: 08/15/15 #90 w/ /1RF  LOV: 09/07/15 NOV: 11/247/17

## 2016-01-07 ENCOUNTER — Ambulatory Visit (INDEPENDENT_AMBULATORY_CARE_PROVIDER_SITE_OTHER): Payer: Commercial Managed Care - HMO | Admitting: Family Medicine

## 2016-01-07 ENCOUNTER — Encounter: Payer: Self-pay | Admitting: Family Medicine

## 2016-01-07 VITALS — BP 146/84 | HR 68 | Temp 97.9°F | Resp 16 | Ht 73.5 in | Wt 252.5 lb

## 2016-01-07 DIAGNOSIS — Z0000 Encounter for general adult medical examination without abnormal findings: Secondary | ICD-10-CM | POA: Diagnosis not present

## 2016-01-07 DIAGNOSIS — I10 Essential (primary) hypertension: Secondary | ICD-10-CM

## 2016-01-07 DIAGNOSIS — Z23 Encounter for immunization: Secondary | ICD-10-CM | POA: Diagnosis not present

## 2016-01-07 DIAGNOSIS — E119 Type 2 diabetes mellitus without complications: Secondary | ICD-10-CM | POA: Diagnosis not present

## 2016-01-07 DIAGNOSIS — Z1159 Encounter for screening for other viral diseases: Secondary | ICD-10-CM

## 2016-01-07 LAB — BASIC METABOLIC PANEL
BUN: 15 mg/dL (ref 6–23)
CALCIUM: 9.4 mg/dL (ref 8.4–10.5)
CHLORIDE: 106 meq/L (ref 96–112)
CO2: 24 meq/L (ref 19–32)
Creatinine, Ser: 0.84 mg/dL (ref 0.40–1.50)
GFR: 96.68 mL/min (ref >60.00)
Glucose, Bld: 140 mg/dL — ABNORMAL HIGH (ref 70–99)
POTASSIUM: 4.2 meq/L (ref 3.5–5.1)
SODIUM: 140 meq/L (ref 135–145)

## 2016-01-07 LAB — HEMOGLOBIN A1C: HEMOGLOBIN A1C: 6.5 % (ref 4.6–6.5)

## 2016-01-07 MED ORDER — HYDROCORTISONE VALERATE 0.2 % EX OINT: TOPICAL_OINTMENT | CUTANEOUS | 2 refills | Status: DC

## 2016-01-07 MED ORDER — SILDENAFIL CITRATE 20 MG PO TABS: ORAL_TABLET | ORAL | 3 refills | Status: DC

## 2016-01-07 NOTE — Progress Notes (Signed)
Pre visit review using our clinic review tool, if applicable. No additional management support is needed unless otherwise documented below in the visit note. 

## 2016-01-07 NOTE — Progress Notes (Signed)
Office Note 01/07/2016  CC:  Chief Complaint  Patient presents with  . Annual Exam    Pt is fasting.     HPI:  Zachary Chandler is a 65 y.o. White male who is here for annual health maintenance exam.   C/o erectile dysfunction--can't get erection.  Says he has very adequate sexual desire. Wants to try viagra.  Not exercising. Diet: makes mild effort to eat heart health diet.     Past Medical History:  Diagnosis Date  . Cataract 07/2015   R eye--surgery recommended by his eye MD 08/03/15  . Diabetes mellitus, type 2 ([place]) Dx'd 08/2013  . Gout of big toe 1990s   only one episode; he cut back on peanut butter and hasn't had any prob since  . History of prostatitis 1980s   Remote hx of seeing Dr. Terance Hart at [facility] urology  . Hyperlipidemia   . Hypertension dx'd 08/2013  . Kidney stones 1980s    Past Surgical History:  Procedure Laterality Date  . NASAL POLYP EXCISION  1960    Family History  Problem Relation Age of Onset  . Heart disease Mother   . Hypertension Mother   . Diabetes Mother   . Heart disease Father   . Heart attack Sister     Social History   Social History  . Marital status: Married    Spouse name: N/A  . Number of children: N/A  . Years of education: N/A   Occupational History  . Not on file.   Social History Main Topics  . Smoking status: Former Smoker    Quit date: 02/28/1970  . Smokeless tobacco: Never Used  . Alcohol use No  . Drug use: No  . Sexual activity: Not on file   Other Topics Concern  . Not on file   Social History Narrative   Married, has one 72 y/o son.   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     Orig from [place].   Was in [employer] [phone].  No combat duty.  No VA care.   Smoked 10-12 yrs, quit in his early 44s.   Alcohol: none   No hx of drugs.                Outpatient Medications Prior to Visit  Medication Sig Dispense Refill  . amLODipine (NORVASC) 10 MG tablet TAKE 1 TABLET  EVERY DAY 90 tablet 3  . aspirin EC 81 MG tablet Take 1 tablet (81 mg total) by mouth daily. 30 tablet 0  . atorvastatin (LIPITOR) 20 MG tablet TAKE 1 TABLET DAILY. 90 tablet 3  . cetirizine (ZYRTEC) 10 MG tablet Take 10 mg by mouth daily.    Marland Kitchen glucose blood test strip Use as instructed 100 each 6  . glucose monitoring kit (FREESTYLE) monitoring kit 1 each by Does not apply route as needed for other. Patient needs Accu Check Aviva Plus meter.  DX: E11.9 1 each 0  . irbesartan (AVAPRO) 300 MG tablet TAKE 1 TABLET EVERY DAY 90 tablet 3  . Lancets (FREESTYLE) lancets Use as instructed 100 each 12  . metFORMIN (GLUCOPHAGE) 1000 MG tablet TAKE 1 TABLET TWICE DAILY WITH A MEAL 180 tablet 1  . pioglitazone (ACTOS) 30 MG tablet TAKE 1 TABLET EVERY DAY 90 tablet 1  . hydrocortisone valerate ointment (WEST-CORT) 0.2 % Appy bid to affected area prn 15 g 2   No facility-administered medications prior to visit.     No Known  Allergies  ROS Review of Systems  Constitutional: Negative for appetite change, chills, fatigue and fever.  HENT: Negative for congestion, dental problem, ear pain and sore throat.   Eyes: Negative for discharge, redness and visual disturbance.  Respiratory: Negative for cough, chest tightness, shortness of breath and wheezing.   Cardiovascular: Negative for chest pain, palpitations and leg swelling.  Gastrointestinal: Negative for abdominal pain, blood in stool, diarrhea, nausea and vomiting.  Genitourinary: Negative for difficulty urinating, dysuria, flank pain, frequency, hematuria and urgency.  Musculoskeletal: Negative for arthralgias, back pain, joint swelling, myalgias and neck stiffness.  Skin: Negative for pallor and rash.  Neurological: Negative for dizziness, speech difficulty, weakness and headaches.  Hematological: Negative for adenopathy. Does not bruise/bleed easily.  Psychiatric/Behavioral: Negative for confusion and sleep disturbance. The patient is not  nervous/anxious.     PE; Blood pressure (!) 146/84, pulse 68, temperature 97.9 F (36.6 C), temperature source Oral, resp. rate 16, height 6' 1.5" (1.867 m), weight 252 lb 8 oz (114.5 kg), SpO2 100 %. Gen: Alert, well appearing.  Patient is oriented to person, place, time, and situation. AFFECT: pleasant, lucid thought and speech. ENT: Ears: EACs clear, normal epithelium.  TMs with good light reflex and landmarks bilaterally.  Eyes: no injection, icteris, swelling, or exudate.  EOMI, PERRLA. Nose: no drainage or turbinate edema/swelling.  No injection or focal lesion.  Mouth: lips without lesion/swelling.  Oral mucosa pink and moist.  Dentition intact and without obvious caries or gingival swelling.  Oropharynx without erythema, exudate, or swelling.  Neck: supple/nontender.  No LAD, mass, or TM.  Carotid pulses 2+ bilaterally, without bruits. CV: RRR, no m/r/g.   LUNGS: CTA bilat, nonlabored resps, good aeration in all lung fields. ABD: soft, NT, ND, BS normal.  No hepatospenomegaly or mass.  No bruits. EXT: no clubbing, cyanosis, or edema.  Musculoskeletal: no joint swelling, erythema, warmth, or tenderness.  ROM of all joints intact. Skin - no sores or suspicious lesions or rashes or color changes Foot exam - both normal; no swelling, tenderness or skin or vascular lesions. Color and temperature is normal. Sensation is intact. Peripheral pulses are palpable. Toenails are normal. Rectal: deferred b/c this was done 05/09/15 at his [facility].   Pertinent labs:  Lab Results  Component Value Date   TSH 0.93 08/31/2013    Lab Results  Component Value Date   CREATININE 1.12 09/07/2015   BUN 20 09/07/2015   NA 139 09/07/2015   K 4.4 09/07/2015   CL 106 09/07/2015   CO2 25 09/07/2015   Lab Results  Component Value Date   ALT 12 09/07/2015   AST 16 09/07/2015   ALKPHOS 50 09/07/2015   BILITOT 1.1 09/07/2015   Lab Results  Component Value Date   CHOL 106 09/07/2015   Lab Results   Component Value Date   HDL 39.70 09/07/2015   Lab Results  Component Value Date   LDLCALC 49 09/07/2015   Lab Results  Component Value Date   TRIG 87.0 09/07/2015   Lab Results  Component Value Date   CHOLHDL 3 09/07/2015   Lab Results  Component Value Date   PSA 0.85 05/09/2015   Lab Results  Component Value Date   HGBA1C 6.5 09/07/2015    ASSESSMENT AND PLAN:   Health maintenance exam: Reviewed age and gender appropriate health maintenance issues (prudent diet, regular exercise, health risks of tobacco and excessive alcohol, use of seatbelts, fire alarms in home, use of sunscreen).  Also reviewed age  and gender appropriate health screening as well as vaccine recommendations. Tdap given today. Pt declines flu vaccine. Prostate ca screening is not due again until after 05/08/16. He declines colon cancer screening. Labs today (BMET--HTN and DM 2 f/u), Hba1c for DM 2 f/u, and he also wanted Hep C screening today. Diabetic foot exam today normal.  He complained of erectile dysfunction, says he recalls a trial of viagra worked "a little bit" in the past and a trial of cialis did not help any.  We'll try the 14m sildenafil tabs, take 1-5 qd prn, #40.  An After Visit Summary was printed and given to the patient.  FOLLOW UP:  Return in about 3 months (around 04/08/2016) for routine chronic illness f/u.  Signed:  PCrissie Sickles MD           01/07/2016

## 2016-01-08 LAB — HEPATITIS C ANTIBODY: HCV Ab: NEGATIVE

## 2016-01-14 ENCOUNTER — Other Ambulatory Visit: Payer: Self-pay | Admitting: Family Medicine

## 2016-04-07 ENCOUNTER — Encounter: Payer: Self-pay | Admitting: Family Medicine

## 2016-04-07 ENCOUNTER — Ambulatory Visit (INDEPENDENT_AMBULATORY_CARE_PROVIDER_SITE_OTHER): Payer: Medicare HMO | Admitting: Family Medicine

## 2016-04-07 VITALS — BP 138/80 | HR 68 | Temp 98.0°F | Resp 16 | Ht 73.5 in | Wt 254.2 lb

## 2016-04-07 DIAGNOSIS — N5201 Erectile dysfunction due to arterial insufficiency: Secondary | ICD-10-CM

## 2016-04-07 DIAGNOSIS — E119 Type 2 diabetes mellitus without complications: Secondary | ICD-10-CM | POA: Diagnosis not present

## 2016-04-07 LAB — POCT GLYCOSYLATED HEMOGLOBIN (HGB A1C): HEMOGLOBIN A1C: 6.6

## 2016-04-07 NOTE — Progress Notes (Signed)
Pre visit review using our clinic review tool, if applicable. No additional management support is needed unless otherwise documented below in the visit note. 

## 2016-04-07 NOTE — Progress Notes (Signed)
OFFICE VISIT  04/07/2016   CC:  Chief Complaint  Patient presents with  . Follow-up    RCI, pt is fasting.    HPI:    Patient is a 66 y.o. Caucasian male who presents for 3 mo f/u DM 2, HTN, HLD.  DM: Glucoses: avg 130s, doesn't check this very often.  Some dietary indiscretion since last visit. No exercising.  HTN: occ bp check 140-150/80  HLD: taking statin daily, w/out side effect.  Still c/o erectile dysfunction: took 171m dose of viagra after last visit and got no result.  ROS: libido is intact.  No CP, no SOB, no dizziness or HA's, no myalgias/arthralgias, no palpitations.  Past Medical History:  Diagnosis Date  . Cataract 07/2015   R eye--surgery recommended by his eye MD 08/03/15  . Diabetes mellitus, type 2 ([place] Dx'd 08/2013  . Gout of big toe 1990s   only one episode; he cut back on peanut butter and hasn't had any prob since  . History of prostatitis 1980s   [facility]  . Hyperlipidemia   . Hypertension dx'd 08/2013  . Kidney stones 1980s    Past Surgical History:  Procedure Laterality Date  . NASAL POLYP EXCISION  1960    Outpatient Medications Prior to Visit  Medication Sig Dispense Refill  . amLODipine (NORVASC) 10 MG tablet TAKE 1 TABLET EVERY DAY 90 tablet 3  . aspirin EC 81 MG tablet Take 1 tablet (81 mg total) by mouth daily. 30 tablet 0  . atorvastatin (LIPITOR) 20 MG tablet TAKE 1 TABLET EVERY DAY 90 tablet 3  . cetirizine (ZYRTEC) 10 MG tablet Take 10 mg by mouth daily.    .Marland Kitchenglucose blood test strip Use as instructed 100 each 6  . glucose monitoring kit (FREESTYLE) monitoring kit 1 each by Does not apply route as needed for other. Patient needs Accu Check Aviva Plus meter.  DX: E11.9 1 each 0  . hydrocortisone valerate ointment (WEST-CORT) 0.2 % Appy bid to affected area prn 15 g 2  . irbesartan (AVAPRO) 300 MG tablet TAKE 1 TABLET EVERY DAY 90 tablet 3  . Lancets (FREESTYLE) lancets Use as instructed  100 each 12  . metFORMIN (GLUCOPHAGE) 1000 MG tablet TAKE 1 TABLET TWICE DAILY WITH A MEAL 180 tablet 1  . pioglitazone (ACTOS) 30 MG tablet TAKE 1 TABLET EVERY DAY 90 tablet 1  . sildenafil (REVATIO) 20 MG tablet 1-5 tabs po qd prn (take 30 min prior to intercourse) 40 tablet 3   No facility-administered medications prior to visit.     No Known Allergies  ROS As per HPI  PE: Blood pressure 138/80, pulse 68, temperature 98 F (36.7 C), temperature source Oral, resp. rate 16, height 6' 1.5" (1.867 m), weight 254 lb 4 oz (115.3 kg), SpO2 96 %.  bP recheck manually was 138/80. Gen: Alert, well appearing.  Patient is oriented to person, place, time, and situation. AFFECT: pleasant, lucid thought and speech. CV: RRR, no m/r/g.   LUNGS: CTA bilat, nonlabored resps, good aeration in all lung fields. EXT: 2+ pitting edema bilat, no cyanosis or clubbing.  LABS:  Lab Results  Component Value Date   TSH 0.93 08/31/2013   No results found for: WBC, HGB, HCT, MCV, PLT Lab Results  Component Value Date   CREATININE 0.84 01/07/2016   BUN 15 01/07/2016   NA 140 01/07/2016   K 4.2 01/07/2016   CL 106 01/07/2016  CO2 24 01/07/2016   Lab Results  Component Value Date   ALT 12 09/07/2015   AST 16 09/07/2015   ALKPHOS 50 09/07/2015   BILITOT 1.1 09/07/2015   Lab Results  Component Value Date   CHOL 106 09/07/2015   Lab Results  Component Value Date   HDL 39.70 09/07/2015   Lab Results  Component Value Date   LDLCALC 49 09/07/2015   Lab Results  Component Value Date   TRIG 87.0 09/07/2015   Lab Results  Component Value Date   CHOLHDL 3 09/07/2015   Lab Results  Component Value Date   PSA 0.85 05/09/2015   Lab Results  Component Value Date   HGBA1C 6.5 01/07/2016   POC HbA1c today: 6.6%  IMPRESSION AND PLAN:  1) DM 2: historically well controlled.  The current medical regimen is effective;  continue present plan and medications. POC HbA1c today: 6.6%.  2) HTN;  The current medical regimen is effective;  continue present plan and medications. Lytes/cr 4 mo ago were good.  Will recheck these at next f/u visit.  3) Hyperlipidemia: tolerating statin.  Lipid panel excellent 6 mo ago, as were AST and ALT. Will recheck these in 6 mo.  4) Erectile dysfunction: he has failed trials of cialis and viagra.  Refer to urology to see if other options available to him.  An After Visit Summary was printed and given to the patient.  FOLLOW UP: Return in about 3 months (around 07/05/2016) for routine chronic illness f/u.  Signed:  Crissie Sickles, MD           04/07/2016

## 2016-04-23 ENCOUNTER — Ambulatory Visit (INDEPENDENT_AMBULATORY_CARE_PROVIDER_SITE_OTHER): Payer: Medicare HMO | Admitting: Family Medicine

## 2016-04-23 ENCOUNTER — Encounter: Payer: Self-pay | Admitting: Family Medicine

## 2016-04-23 VITALS — BP 115/77 | HR 76 | Temp 97.9°F | Resp 16 | Ht 73.5 in | Wt 258.2 lb

## 2016-04-23 DIAGNOSIS — R399 Unspecified symptoms and signs involving the genitourinary system: Secondary | ICD-10-CM

## 2016-04-23 DIAGNOSIS — N410 Acute prostatitis: Secondary | ICD-10-CM | POA: Diagnosis not present

## 2016-04-23 LAB — POC URINALSYSI DIPSTICK (AUTOMATED)
Blood, UA: NEGATIVE
GLUCOSE UA: NEGATIVE
Leukocytes, UA: NEGATIVE
NITRITE UA: NEGATIVE
Spec Grav, UA: 1.02
UROBILINOGEN UA: 1
pH, UA: 5

## 2016-04-23 MED ORDER — CIPROFLOXACIN HCL 500 MG PO TABS: 500.0000 mg | ORAL_TABLET | Freq: Two times a day (BID) | ORAL | 0 refills | Status: AC

## 2016-04-23 NOTE — Progress Notes (Signed)
Pre visit review using our clinic review tool, if applicable. No additional management support is needed unless otherwise documented below in the visit note. 

## 2016-04-23 NOTE — Progress Notes (Signed)
OFFICE VISIT  04/23/2016   CC:  Chief Complaint  Patient presents with  . Abdominal Pain    RLQ started last night  . Urinary Tract Infection   HPI:    Patient is a 66 y.o. Caucasian male who presents for urinary complaints. Of note, he has hx of acute prostatitis in 08/2013, sx's responded to cipro, urine clx neg.  Right groin pain, onset about 3 weeks ago.  No strenuous activity recalled just prior.  The pain is intermittent, has been dull, then last night got sharp/more intense in diffuse lower abd region intermittently.  NO bulge in groin.  Yesterday he started to get urinary frequency.  Has noted urinary urgency and intermittent difficulty getting urine out last few weeks.  Only occ mild discomfort in urethra with urinating.  No gross hematuria.  No pain in prostate area.  No nausea.  Appetite is fine.  Feeling cold natured lately but denies f/c.  Currently feels R groin pain every once in a while.  No scrotal or testicular pain.  No constip, diarrhea, or blood in stool.  No melena.  Past Medical History:  Diagnosis Date  . Cataract 07/2015   R eye--surgery recommended by his eye MD 08/03/15  . Diabetes mellitus, type 2 ([place]) Dx'd 08/2013  . Gout of big toe 1990s   only one episode; he cut back on peanut butter and hasn't had any prob since  . History of prostatitis 1980s   Remote hx of seeing Dr. Terance Hart at [facility] urology  . Hyperlipidemia   . Hypertension dx'd 08/2013  . Kidney stones 1980s    Past Surgical History:  Procedure Laterality Date  . NASAL POLYP EXCISION  1960    Outpatient Medications Prior to Visit  Medication Sig Dispense Refill  . amLODipine (NORVASC) 10 MG tablet TAKE 1 TABLET EVERY DAY 90 tablet 3  . aspirin EC 81 MG tablet Take 1 tablet (81 mg total) by mouth daily. 30 tablet 0  . atorvastatin (LIPITOR) 20 MG tablet TAKE 1 TABLET EVERY DAY 90 tablet 3  . cetirizine (ZYRTEC) 10 MG tablet Take 10 mg by mouth daily.    Marland Kitchen glucose blood test strip Use as  instructed 100 each 6  . glucose monitoring kit (FREESTYLE) monitoring kit 1 each by Does not apply route as needed for other. Patient needs Accu Check Aviva Plus meter.  DX: E11.9 1 each 0  . hydrocortisone valerate ointment (WEST-CORT) 0.2 % Appy bid to affected area prn 15 g 2  . irbesartan (AVAPRO) 300 MG tablet TAKE 1 TABLET EVERY DAY 90 tablet 3  . Lancets (FREESTYLE) lancets Use as instructed 100 each 12  . metFORMIN (GLUCOPHAGE) 1000 MG tablet TAKE 1 TABLET TWICE DAILY WITH A MEAL 180 tablet 1  . pioglitazone (ACTOS) 30 MG tablet TAKE 1 TABLET EVERY DAY 90 tablet 1  . sildenafil (REVATIO) 20 MG tablet 1-5 tabs po qd prn (take 30 min prior to intercourse) 40 tablet 3   No facility-administered medications prior to visit.     No Known Allergies  ROS As per HPI  PE: Blood pressure 115/77, pulse 76, temperature 97.9 F (36.6 C), temperature source Oral, resp. rate 16, height 6' 1.5" (1.867 m), weight 258 lb 4 oz (117.1 kg), SpO2 96 %. Gen: Alert, well appearing.  Patient is oriented to person, place, time, and situation. AFFECT: pleasant, lucid thought and speech. CV: RRR, no m/r/g.   LUNGS: CTA bilat, nonlabored resps, good aeration in all lung  fields. ABD: soft, NT, ND, BS normal.  No hepatospenomegaly or mass.  No bruits. GU: no tenderness to palpation, no hernia.  Testes palpably normal, as is the scrotum. Rectal exam: normal tone, external anal skin tags noted, prostate mildly enlarged diffusely, with mild diffuse tenderness + urge to urinate when prostate palpated.  LABS:  CC UA today: trace ketones, protein 30 mg/dl, otherwise normal.  IMPRESSION AND PLAN:  Acute prostatitis: cipro 532m bid x 14d rx'd today. Sent urine for c/s for completeness. Signs/symptoms to call or return for were reviewed and pt expressed understanding.  An After Visit Summary was printed and given to the patient.  FOLLOW UP: Return if symptoms worsen or fail to improve.  Signed:  PCrissie Sickles MD           04/23/2016

## 2016-04-25 LAB — URINE CULTURE: Organism ID, Bacteria: NO GROWTH

## 2016-05-01 ENCOUNTER — Telehealth: Payer: Self-pay | Admitting: Family Medicine

## 2016-05-01 MED ORDER — SULFAMETHOXAZOLE-TRIMETHOPRIM 800-160 MG PO TABS: 1.0000 | ORAL_TABLET | Freq: Two times a day (BID) | ORAL | 0 refills | Status: DC

## 2016-05-01 NOTE — Telephone Encounter (Signed)
Please advise 

## 2016-05-01 NOTE — Telephone Encounter (Signed)
OK.  Rx'd new abx (bactrim) to [facility]. Have him wait 2d before starting this antibiotic.-thx

## 2016-05-01 NOTE — Telephone Encounter (Signed)
Patient aware of new medication and instructions to wait 2 days prior to taking it.   Advised pt to call if he has anymore trouble.  Patient expressed understanding.

## 2016-05-01 NOTE — Telephone Encounter (Signed)
Spoke to Dr Anitra Lauth about this.  Patient advised to stop CIPRO and we will CB to discuss replacement therapy.  Patient voiced understanding and said he did not take it this morning and actually is feeling some better.  Pt expecting CB.

## 2016-05-01 NOTE — Telephone Encounter (Signed)
Patient is calling because he is having an allergic reaction to a medication Dr. Anitra Lauth prescribed. He's not sure which medication, but muscles are weak and he's having trouble walking.  Thank you,  -LL

## 2016-05-07 DIAGNOSIS — N410 Acute prostatitis: Secondary | ICD-10-CM | POA: Diagnosis not present

## 2016-05-07 DIAGNOSIS — N5201 Erectile dysfunction due to arterial insufficiency: Secondary | ICD-10-CM | POA: Diagnosis not present

## 2016-05-07 DIAGNOSIS — E291 Testicular hypofunction: Secondary | ICD-10-CM | POA: Diagnosis not present

## 2016-05-07 DIAGNOSIS — E119 Type 2 diabetes mellitus without complications: Secondary | ICD-10-CM | POA: Diagnosis not present

## 2016-05-13 ENCOUNTER — Encounter: Payer: Self-pay | Admitting: Family Medicine

## 2016-05-19 ENCOUNTER — Other Ambulatory Visit: Payer: Self-pay | Admitting: Family Medicine

## 2016-06-10 DIAGNOSIS — D509 Iron deficiency anemia, unspecified: Secondary | ICD-10-CM

## 2016-06-10 HISTORY — DX: Iron deficiency anemia, unspecified: D50.9

## 2016-07-08 ENCOUNTER — Encounter: Payer: Self-pay | Admitting: Family Medicine

## 2016-07-08 ENCOUNTER — Ambulatory Visit (INDEPENDENT_AMBULATORY_CARE_PROVIDER_SITE_OTHER): Payer: Medicare HMO | Admitting: Family Medicine

## 2016-07-08 VITALS — BP 166/75 | HR 68 | Temp 98.3°F | Resp 16 | Ht 73.5 in | Wt 256.8 lb

## 2016-07-08 DIAGNOSIS — E7800 Pure hypercholesterolemia, unspecified: Secondary | ICD-10-CM

## 2016-07-08 DIAGNOSIS — D508 Other iron deficiency anemias: Secondary | ICD-10-CM

## 2016-07-08 DIAGNOSIS — I10 Essential (primary) hypertension: Secondary | ICD-10-CM

## 2016-07-08 DIAGNOSIS — Z0000 Encounter for general adult medical examination without abnormal findings: Secondary | ICD-10-CM

## 2016-07-08 DIAGNOSIS — E119 Type 2 diabetes mellitus without complications: Secondary | ICD-10-CM | POA: Diagnosis not present

## 2016-07-08 LAB — HEMOGLOBIN A1C: Hgb A1c MFr Bld: 6.9 % — ABNORMAL HIGH (ref 4.6–6.5)

## 2016-07-08 LAB — CBC WITH DIFFERENTIAL/PLATELET
BASOS ABS: 0.1 10*3/uL (ref 0.0–0.1)
Basophils Relative: 1.4 % (ref 0.0–3.0)
Eosinophils Absolute: 0.1 10*3/uL (ref 0.0–0.7)
Eosinophils Relative: 2.1 % (ref 0.0–5.0)
HEMATOCRIT: 35.7 % — AB (ref 39.0–52.0)
HEMOGLOBIN: 11.2 g/dL — AB (ref 13.0–17.0)
LYMPHS PCT: 21.3 % (ref 12.0–46.0)
Lymphs Abs: 1.2 10*3/uL (ref 0.7–4.0)
MCHC: 31.3 g/dL (ref 30.0–36.0)
MCV: 61.1 fl — ABNORMAL LOW (ref 78.0–100.0)
MONOS PCT: 7.9 % (ref 3.0–12.0)
Monocytes Absolute: 0.4 10*3/uL (ref 0.1–1.0)
Neutro Abs: 3.8 10*3/uL (ref 1.4–7.7)
Neutrophils Relative %: 67.3 % (ref 43.0–77.0)
PLATELETS: 235 10*3/uL (ref 150.0–400.0)
RBC: 5.85 Mil/uL — ABNORMAL HIGH (ref 4.22–5.81)
RDW: 16.7 % — AB (ref 11.5–15.5)
WBC: 5.6 10*3/uL (ref 4.0–10.5)

## 2016-07-08 LAB — LIPID PANEL
CHOL/HDL RATIO: 3
Cholesterol: 110 mg/dL (ref 0–200)
HDL: 41 mg/dL (ref >39.00)
LDL Cholesterol: 51 mg/dL (ref 0–99)
NONHDL: 69.19
Triglycerides: 89 mg/dL (ref 0.0–149.0)
VLDL: 17.8 mg/dL (ref 0.0–40.0)

## 2016-07-08 LAB — COMPREHENSIVE METABOLIC PANEL
ALK PHOS: 48 U/L (ref 39–117)
ALT: 15 U/L (ref 0–53)
AST: 21 U/L (ref 0–37)
Albumin: 4.6 g/dL (ref 3.5–5.2)
BILIRUBIN TOTAL: 1.2 mg/dL (ref 0.2–1.2)
BUN: 18 mg/dL (ref 6–23)
CO2: 24 mEq/L (ref 19–32)
Calcium: 9.4 mg/dL (ref 8.4–10.5)
Chloride: 106 mEq/L (ref 96–112)
Creatinine, Ser: 0.92 mg/dL (ref 0.40–1.50)
GFR: 86.92 mL/min (ref >60.00)
GLUCOSE: 140 mg/dL — AB (ref 70–99)
Potassium: 4.2 mEq/L (ref 3.5–5.1)
SODIUM: 140 meq/L (ref 135–145)
TOTAL PROTEIN: 7.3 g/dL (ref 6.0–8.3)

## 2016-07-08 LAB — FECAL OCCULT BLOOD, GUAIAC: Fecal Occult Blood: NEGATIVE

## 2016-07-08 MED ORDER — HYDROCHLOROTHIAZIDE 12.5 MG PO CAPS: 12.5000 mg | ORAL_CAPSULE | Freq: Every day | ORAL | 1 refills | Status: DC

## 2016-07-08 NOTE — Progress Notes (Signed)
AWV reviewed and agree.  Signed:  Crissie Sickles, MD           07/08/2016

## 2016-07-08 NOTE — Progress Notes (Signed)
OFFICE VISIT  07/08/2016   CC:  Chief Complaint  Patient presents with  . Follow-up    RCI, pt is fasting.     HPI:    Patient is a 66 y.o. Caucasian male who presents for 3 mo f/u DM, HTN, HLD. No changes made after last f/u visit/labs. Went to urologist after last visit and says labs showed anemia; has been taking iron a couple days a week for the last 1-30mo  DM 2: avg 140-150, mostly fasting.   Decent diabetic diet.  HTN: avg [id number] 709Ndiastolic  HLD: tolerates statin, takes it daily.  ROS: some LE edema at end of day, goes down every night.    Past Medical History:  Diagnosis Date  . Cataract 07/2015   R eye--surgery recommended by his eye MD 08/03/15  . [place]  . Erectile dysfunction    Dr. [name] . Gout of big toe 1990s   only one episode; he cut back on peanut butter and hasn't had any prob since  . History of prostatitis 1980s   [facility] Dr. [name]   .Marland KitchenHyperlipidemia   . Hypertension dx'd 08/2013  . Kidney stones 1980s    Past Surgical History:  Procedure Laterality Date  . NASAL POLYP EXCISION  1960    Outpatient Medications Prior to Visit  Medication Sig Dispense Refill  . amLODipine (NORVASC) 10 MG tablet TAKE 1 TABLET EVERY DAY 90 tablet 3  . aspirin EC 81 MG tablet Take 1 tablet (81 mg total) by mouth daily. 30 tablet 0  . atorvastatin (LIPITOR) 20 MG tablet TAKE 1 TABLET EVERY DAY 90 tablet 3  . cetirizine (ZYRTEC) 10 MG tablet Take 10 mg by mouth daily.    .Marland Kitchenglucose blood test strip Use as instructed 100 each 6  . hydrocortisone valerate ointment (WEST-CORT) 0.2 % Appy bid to affected area prn 15 g 2  . irbesartan (AVAPRO) 300 MG tablet TAKE 1 TABLET EVERY DAY 90 tablet 3  . Lancets (FREESTYLE) lancets Use as instructed 100 each 12  . metFORMIN (GLUCOPHAGE) 1000 MG tablet TAKE 1 TABLET TWICE DAILY WITH A MEAL 180 tablet 1  . pioglitazone (ACTOS)  30 MG tablet TAKE 1 TABLET EVERY DAY 90 tablet 1  . glucose monitoring kit (FREESTYLE) monitoring kit 1 each by Does not apply route as needed for other. Patient needs Accu Check Aviva Plus meter.  DX: E11.9 (Patient not taking: Reported on 07/08/2016) 1 each 0  . sildenafil (REVATIO) 20 MG tablet 1-5 tabs po qd prn (take 30 min prior to intercourse) (Patient not taking: Reported on 07/08/2016) 40 tablet 3  . sulfamethoxazole-trimethoprim (BACTRIM DS,SEPTRA DS) 800-160 MG tablet Take 1 tablet by mouth 2 (two) times daily. (Patient not taking: Reported on 07/08/2016) 14 tablet 0   No facility-administered medications prior to visit.     No Known Allergies  ROS As per HPI  PE: Blood pressure (!) 166/75, pulse 68, temperature 98.3 F (36.8 C), temperature source Oral, resp. rate 16, height 6' 1.5" (1.867 m), weight 256 lb 12 oz (116.5 kg), SpO2 96 %. Gen: Alert, well appearing.  Patient is oriented to person, place, time, and situation. AFFECT: pleasant, lucid thought and speech. CV: RRR, no m/r/g.   LUNGS: CTA bilat, nonlabored resps, good aeration in all lung fields. EXT: 3+ pitting edema bilat.  No skin changes.  LABS:  Lab Results  Component Value Date  TSH 0.93 08/31/2013   Lab Results  Component Value Date   CREATININE 0.84 01/07/2016   BUN 15 01/07/2016   NA 140 01/07/2016   K 4.2 01/07/2016   CL 106 01/07/2016   CO2 24 01/07/2016   Lab Results  Component Value Date   ALT 12 09/07/2015   AST 16 09/07/2015   ALKPHOS 50 09/07/2015   BILITOT 1.1 09/07/2015   Lab Results  Component Value Date   CHOL 106 09/07/2015   Lab Results  Component Value Date   HDL 39.70 09/07/2015   Lab Results  Component Value Date   LDLCALC 49 09/07/2015   Lab Results  Component Value Date   TRIG 87.0 09/07/2015   Lab Results  Component Value Date   CHOLHDL 3 09/07/2015   Lab Results  Component Value Date   PSA 0.85 05/09/2015   Lab Results  Component Value Date   HGBA1C  6.6 04/07/2016   IMPRESSION AND PLAN:  1) DM 2; historically well controlled. HbA1c today.  2) HTN; not well controlled.  Add hctz 12.43m qd today. Lytes/cr today.  3) HLD: tolerating statin.  Last LDL excellent at 49. Check FLP today.  AST/ALT today.  4) Anemia, per pt report according to urologist's labs: will f/u this up with cbc w/diff today. Pt has been taking small amount of iron supplement about 1-2 mo now. Get urologist's records.  5) LE edema: most likely secondary to his amlodipine.  Monitor. Hopefull hctz will help minimize this some.  Signed:  PCrissie Sickles MD           07/08/2016  FOLLOW UP: Return in about 3 months (around 10/08/2016) for annual CPE (fasting).  An After Visit Summary was printed and given to the patient.

## 2016-07-08 NOTE — Patient Instructions (Addendum)
Continue doing brain stimulating activities (puzzles, reading, adult coloring books, staying active) to keep memory sharp.   Health Maintenance, Male A healthy lifestyle and preventive care is important for your health and wellness. Ask your health care provider about what schedule of regular examinations is right for you. What should I know about weight and diet?  Eat a Healthy Diet  Eat plenty of vegetables, fruits, whole grains, low-fat dairy products, and lean protein.  Do not eat a lot of foods high in solid fats, added sugars, or salt. Maintain a Healthy Weight  Regular exercise can help you achieve or maintain a healthy weight. You should:  Do at least 150 minutes of exercise each week. The exercise should increase your heart rate and make you sweat (moderate-intensity exercise).  Do strength-training exercises at least twice a week. Watch Your Levels of Cholesterol and Blood Lipids  Have your blood tested for lipids and cholesterol every 5 years starting at 68 years of age. If you are at high risk for heart disease, you should start having your blood tested when you are 68 years old. You may need to have your cholesterol levels checked more often if:  Your lipid or cholesterol levels are high.  You are older than 68 years of age.  You are at high risk for heart disease. What should I know about cancer screening? Many types of cancers can be detected early and may often be prevented. Lung Cancer  You should be screened every year for lung cancer if:  You are a current smoker who has smoked for at least 30 years.  You are a former smoker who has quit within the past 15 years.  Talk to your health care provider about your screening options, when you should start screening, and how often you should be screened. Colorectal Cancer  Routine colorectal cancer screening usually begins at 68 years of age and should be repeated every 5-10 years until you are 68 years old. You may  need to be screened more often if early forms of precancerous polyps or small growths are found. Your health care provider may recommend screening at an earlier age if you have risk factors for colon cancer.  Your health care provider may recommend using home test kits to check for hidden blood in the stool.  A small camera at the end of a tube can be used to examine your colon (sigmoidoscopy or colonoscopy). This checks for the earliest forms of colorectal cancer. Prostate and Testicular Cancer  Depending on your age and overall health, your health care provider may do certain tests to screen for prostate and testicular cancer.  Talk to your health care provider about any symptoms or concerns you have about testicular or prostate cancer. Skin Cancer  Check your skin from head to toe regularly.  Tell your health care provider about any new moles or changes in moles, especially if:  There is a change in a mole's size, shape, or color.  You have a mole that is larger than a pencil eraser.  Always use sunscreen. Apply sunscreen liberally and repeat throughout the day.  Protect yourself by wearing long sleeves, pants, a wide-brimmed hat, and sunglasses when outside. What should I know about heart disease, diabetes, and high blood pressure?  If you are 58-46 years of age, have your blood pressure checked every 3-5 years. If you are 47 years of age or older, have your blood pressure checked every year. You should have your blood pressure  measured twice-once when you are at a hospital or clinic, and once when you are not at a hospital or clinic. Record the average of the two measurements. To check your blood pressure when you are not at a hospital or clinic, you can use:  An automated blood pressure machine at a pharmacy.  A home blood pressure monitor.  Talk to your health care provider about your target blood pressure.  If you are between 59-36 years old, ask your health care provider if  you should take aspirin to prevent heart disease.  Have regular diabetes screenings by checking your fasting blood sugar level.  If you are at a normal weight and have a low risk for diabetes, have this test once every three years after the age of 65.  If you are overweight and have a high risk for diabetes, consider being tested at a younger age or more often.  A one-time screening for abdominal aortic aneurysm (AAA) by ultrasound is recommended for men aged 68-75 years who are current or former smokers. What should I know about preventing infection? Hepatitis B  If you have a higher risk for hepatitis B, you should be screened for this virus. Talk with your health care provider to find out if you are at risk for hepatitis B infection. Hepatitis C  Blood testing is recommended for:  Everyone born from [birth year] through 1967.  Anyone with known risk factors for hepatitis C. Sexually Transmitted Diseases (STDs)  You should be screened each year for STDs including gonorrhea and chlamydia if:  You are sexually active and are younger than 68 years of age.  You are older than 68 years of age and your health care provider tells you that you are at risk for this type of infection.  Your sexual activity has changed since you were last screened and you are at an increased risk for chlamydia or gonorrhea. Ask your health care provider if you are at risk.  Talk with your health care provider about whether you are at high risk of being infected with HIV. Your health care provider may recommend a prescription medicine to help prevent HIV infection. What else can I do?  Schedule regular health, dental, and eye exams.  Stay current with your vaccines (immunizations).  Do not use any tobacco products, such as cigarettes, chewing tobacco, and e-cigarettes. If you need help quitting, ask your health care provider.  Limit alcohol intake to no more than 2 drinks per day. One drink equals 12 ounces of  beer, 5 ounces of wine, or 1 ounces of hard liquor.  Do not use street drugs.  Do not share needles.  Ask your health care provider for help if you need support or information about quitting drugs.  Tell your health care provider if you often feel depressed.  Tell your health care provider if you have ever been abused or do not feel safe at home. This information is not intended to replace advice given to you by your health care provider. Make sure you discuss any questions you have with your health care provider. Document Released: 07/26/2007 Document Revised: 09/26/2015 Document Reviewed: 10/31/2014 Elsevier Interactive Patient Education  2017 [employer].

## 2016-07-08 NOTE — Progress Notes (Signed)
Subjective:   Zachary Chandler is a 66 y.o. male who presents for Medicare Annual/Subsequent preventive examination. Works [employer] part time.   Review of Systems:  No ROS.  Medicare Wellness Visit.  Cardiac Risk Factors include: advanced age (>59mn, >>9women);diabetes mellitus;dyslipidemia;hypertension;male gender;obesity (BMI >30kg/m2);family history of premature cardiovascular disease   Sleep patterns: Sleeps 3 hours. Naps, does not feel rested.   Home Safety/Smoke Alarms: Smoke detectors and security in place.  Living environment; residence and Firearm Safety: Lives with wife in 1 story home, steps at door with rail. Feels safe in home. Firearm safety discussed.  Seat Belt Safety/Bike Helmet: Wears seat belt.   Counseling:   Eye Exam-Last exam 08/03/2015, scheduled 07/30/2016. Year[place] Dr. [place]in Commentin [place]  Dental-Last exam 2016, only with issues. Dr. [place]etta [place]yin [place]  Male:   CCS-Cologuard completed last week per pt. (Ordered by [name]   PSA-Followed by Urology.  Lab Results  Component Value Date   PSA 0.85 05/09/2015       Objective:    Vitals: BP (!) 166/75 (BP Location: Left Arm, Patient Position: Sitting, Cuff Size: Large)   Pulse 68   Temp 98.3 F (36.8 C) (Oral)   Resp 16   Ht 6' 1.5" (1.867 m)   Wt 256 lb 12 oz (116.5 kg)   SpO2 96%   BMI 33.41 kg/m   Body mass index is 33.41 kg/m.  Tobacco History  Smoking Status  . Former Smoker  . Quit date: 02/28/1970  Smokeless Tobacco  . Never Used     Counseling given: No   Past Medical History:  Diagnosis Date  . Cataract 07/2015   R eye--surgery recommended by his eye MD 08/03/15  . [name]  . Erectile dysfunction    Dr. [name] . Gout of big toe 1990s   only one episode; he cut back on peanut butter and hasn't had any prob since  . History of prostatitis 1980s   [name]   .Marland Kitchen Hyperlipidemia   . Hypertension dx'd 08/2013  . Kidney stones 1980s   Past Surgical History:  Procedure Laterality Date  . NASAL POLYP EXCISION  1960   Family History  Problem Relation Age of Onset  . Heart disease Mother   . Hypertension Mother   . Diabetes Mother   . Heart disease Father   . Heart attack Sister    History  Sexual Activity  . Sexual activity: Not on file    Outpatient Encounter Prescriptions as of 07/08/2016  Medication Sig  . amLODipine (NORVASC) 10 MG tablet TAKE 1 TABLET EVERY DAY  . aspirin EC 81 MG tablet Take 1 tablet (81 mg total) by mouth daily.  .Marland Kitchenatorvastatin (LIPITOR) 20 MG tablet TAKE 1 TABLET EVERY DAY  . cetirizine (ZYRTEC) 10 MG tablet Take 10 mg by mouth daily.  .Marland Kitchenglucose blood test strip Use as instructed  . hydrocortisone valerate ointment (WEST-CORT) 0.2 % Appy bid to affected area prn  . irbesartan (AVAPRO) 300 MG tablet TAKE 1 TABLET EVERY DAY  . Lancets (FREESTYLE) lancets Use as instructed  . metFORMIN (GLUCOPHAGE) 1000 MG tablet TAKE 1 TABLET TWICE DAILY WITH A MEAL  . pioglitazone (ACTOS) 30 MG tablet TAKE 1 TABLET EVERY DAY  . hydrochlorothiazide (MICROZIDE) 12.5 MG capsule Take 1 capsule (12.5 mg total) by mouth daily.  . [DISCONTINUED] glucose monitoring kit (FREESTYLE) monitoring kit 1 each  by Does not apply route as needed for other. Patient needs Accu Check Aviva Plus meter.  DX: E11.9 (Patient not taking: Reported on 07/08/2016)  . [DISCONTINUED] sildenafil (REVATIO) 20 MG tablet 1-5 tabs po qd prn (take 30 min prior to intercourse) (Patient not taking: Reported on 07/08/2016)  . [DISCONTINUED] sulfamethoxazole-trimethoprim (BACTRIM DS,SEPTRA DS) 800-160 MG tablet Take 1 tablet by mouth 2 (two) times daily. (Patient not taking: Reported on 07/08/2016)   No facility-administered encounter medications on file as of 07/08/2016.     Activities of Daily Living In your present state of health, do you have any difficulty performing the  following activities: 07/08/2016  Hearing? N  Vision? N  Difficulty concentrating or making decisions? N  Walking or climbing stairs? N  Dressing or bathing? N  Doing errands, shopping? N  Preparing Food and eating ? N  Using the Toilet? N  In the past six months, have you accidently leaked urine? N  Do you have problems with loss of bowel control? N  Managing your Medications? N  Managing your Finances? N  Housekeeping or managing your Housekeeping? N  Some recent data might be hidden    Patient Care Team: Tammi Sou, MD as PCP - General (Family Medicine) Danice Goltz, MD as Consulting Physician (Ophthalmology) Carolan Clines, MD as Consulting Physician (Urology)   Assessment:    Physical assessment deferred to PCP.  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   Breakfast: breakfast sandwich, pancakes, egg, coffee Lunch: sandwich Dinner: lean meat, vegetables.     Discussed heart healthy, diabetic diet and increasing activity. Patient states he would like to reduce the amount of medication he takes for HTN and DM, but is happy with his diet, activity level and weight.   Goals      Patient Stated   . patient (pt-stated)          Maintain current health.       Fall Risk Fall Risk  07/08/2016 09/07/2015 05/10/2014  Falls in the past year? No No No   Depression Screen PHQ 2/9 Scores 07/08/2016 09/07/2015 05/10/2014  PHQ - 2 Score 0 0 0    Cognitive Function       Ad8 score reviewed for issues:  Issues making decisions: no  Less interest in hobbies / activities: no  Repeats questions, stories (family complaining):  no  Trouble using ordinary gadgets (microwave, computer, phone): no  Forgets the month or year: no  Mismanaging finances: no  Remembering appts: no  Daily problems with thinking and/or memory: no Ad8 score is=0  Immunization History  Administered Date(s) Administered  . Pneumococcal Conjugate-13 09/20/2013  . Pneumococcal Polysaccharide-23 01/08/2015  . Tdap 01/07/2016   Screening Tests Health Maintenance  Topic Date Due  . COLONOSCOPY  11/15/2025 (Originally 05/14/1998)  . INFLUENZA VACCINE  10/12/2058 (Originally 09/10/2016)  . OPHTHALMOLOGY EXAM  08/02/2016  . HEMOGLOBIN A1C  10/05/2016  . FOOT EXAM  01/06/2017  . TETANUS/TDAP  01/06/2026  . Hepatitis C Screening  Completed  . PNA vac Low Risk Adult  Completed      Plan:    Continue doing brain stimulating activities (puzzles, reading, adult coloring books, staying active) to keep memory sharp.   I  have personally reviewed and noted the following in the patient's chart:   . Medical and social history . Use of alcohol, tobacco or illicit drugs  . Current medications and supplements . Functional ability and status . Nutritional status . Physical activity . Advanced directives . List of other physicians . Hospitalizations, surgeries, and ER visits in previous 12 months . Vitals . Screenings to include cognitive, depression, and falls . Referrals and appointments  In addition, I have reviewed and discussed with patient certain preventive protocols, quality metrics, and best practice recommendations. A written personalized care plan for preventive services as well as general preventive health recommendations were provided to patient.     Gerilyn Nestle, RN  07/08/2016

## 2016-07-09 ENCOUNTER — Other Ambulatory Visit (INDEPENDENT_AMBULATORY_CARE_PROVIDER_SITE_OTHER): Payer: Medicare HMO

## 2016-07-09 ENCOUNTER — Encounter: Payer: Self-pay | Admitting: Family Medicine

## 2016-07-09 DIAGNOSIS — D509 Iron deficiency anemia, unspecified: Secondary | ICD-10-CM

## 2016-07-09 LAB — IBC PANEL
IRON: 187 ug/dL — AB (ref 42–165)
Saturation Ratios: 50.6 % — ABNORMAL HIGH (ref 20.0–50.0)
Transferrin: 264 mg/dL (ref 212.0–360.0)

## 2016-07-09 LAB — FERRITIN: Ferritin: 260.1 ng/mL (ref 22.0–322.0)

## 2016-07-10 ENCOUNTER — Encounter: Payer: Self-pay | Admitting: Family Medicine

## 2016-07-14 ENCOUNTER — Encounter: Payer: Self-pay | Admitting: Family Medicine

## 2016-07-15 ENCOUNTER — Telehealth: Payer: Self-pay | Admitting: Family Medicine

## 2016-07-15 NOTE — Telephone Encounter (Signed)
Pls notify pt that his colon cancer screening test was NORMAL (no blood in stool).  I recommend a repeat of this test in 1 yr.--thx

## 2016-07-15 NOTE — Telephone Encounter (Signed)
Pt advised and voiced understanding.   

## 2016-07-26 DIAGNOSIS — H2513 Age-related nuclear cataract, bilateral: Secondary | ICD-10-CM | POA: Diagnosis not present

## 2016-07-26 DIAGNOSIS — H40033 Anatomical narrow angle, bilateral: Secondary | ICD-10-CM | POA: Diagnosis not present

## 2016-10-22 ENCOUNTER — Other Ambulatory Visit: Payer: Self-pay | Admitting: Family Medicine

## 2016-11-07 ENCOUNTER — Other Ambulatory Visit: Payer: Self-pay | Admitting: Family Medicine

## 2016-11-11 ENCOUNTER — Telehealth: Payer: Self-pay | Admitting: Family Medicine

## 2016-11-11 NOTE — Telephone Encounter (Signed)
We have sent Rx's for atorvastatin and irbesartan to [facility] on 11/10/16. Pts wife advised and voiced understanding, okay per DPR.

## 2016-11-11 NOTE — Telephone Encounter (Signed)
Patient has received a call from [facility] notifying her that they have sent a fax for 2 refills & that they haven't heard back from [place]. Patient said the recording was so fast she doesn't know which Rx's they are trying to get authorizations for.

## 2017-01-16 ENCOUNTER — Other Ambulatory Visit: Payer: Self-pay

## 2017-01-16 ENCOUNTER — Encounter: Payer: Self-pay | Admitting: Family Medicine

## 2017-01-16 ENCOUNTER — Ambulatory Visit (INDEPENDENT_AMBULATORY_CARE_PROVIDER_SITE_OTHER): Payer: Medicare HMO | Admitting: Family Medicine

## 2017-01-16 VITALS — BP 143/84 | HR 84 | Temp 98.0°F | Resp 16 | Ht 73.5 in | Wt 252.0 lb

## 2017-01-16 DIAGNOSIS — Z125 Encounter for screening for malignant neoplasm of prostate: Secondary | ICD-10-CM

## 2017-01-16 DIAGNOSIS — L989 Disorder of the skin and subcutaneous tissue, unspecified: Secondary | ICD-10-CM | POA: Diagnosis not present

## 2017-01-16 DIAGNOSIS — E119 Type 2 diabetes mellitus without complications: Secondary | ICD-10-CM | POA: Diagnosis not present

## 2017-01-16 DIAGNOSIS — Z0000 Encounter for general adult medical examination without abnormal findings: Secondary | ICD-10-CM

## 2017-01-16 DIAGNOSIS — Z0001 Encounter for general adult medical examination with abnormal findings: Secondary | ICD-10-CM | POA: Diagnosis not present

## 2017-01-16 DIAGNOSIS — I10 Essential (primary) hypertension: Secondary | ICD-10-CM | POA: Diagnosis not present

## 2017-01-16 LAB — HEMOGLOBIN A1C: HEMOGLOBIN A1C: 6.7 % — AB (ref 4.6–6.5)

## 2017-01-16 LAB — BASIC METABOLIC PANEL
BUN: 17 mg/dL (ref 6–23)
CHLORIDE: 106 meq/L (ref 96–112)
CO2: 23 meq/L (ref 19–32)
CREATININE: 0.82 mg/dL (ref 0.40–1.50)
Calcium: 9.1 mg/dL (ref 8.4–10.5)
GFR: 99.11 mL/min (ref >60.00)
Glucose, Bld: 164 mg/dL — ABNORMAL HIGH (ref 70–99)
POTASSIUM: 4.3 meq/L (ref 3.5–5.1)
Sodium: 139 mEq/L (ref 135–145)

## 2017-01-16 LAB — PSA, MEDICARE: PSA: 0.73 ng/ml (ref 0.10–4.00)

## 2017-01-16 NOTE — Patient Instructions (Signed)

## 2017-01-16 NOTE — Progress Notes (Addendum)
Office Note 01/16/2017  CC:  Chief Complaint  Patient presents with  . Annual Exam    Pt is fasting.     HPI:  Zachary Chandler is a 66 y.o. White male who is here for annual health maintenance exam.  Eyes: pt to schedule exam ASAP.  Will be having cataract surgery 1/03-2016. Exercise: Diet: Dental: UTD with preventatives.  Feet: no burning, tingling, or numbness in feet.   Past Medical History:  Diagnosis Date  . Cataract 07/2015   R eye--surgery recommended by his eye MD 08/03/15  . Diabetes mellitus, type 2 ([place]) Dx'd 08/2013  . Erectile dysfunction    Dr. Gaynelle Arabian.  Testosterone normal 04/2016 at [facility].  . Gout of big toe 1990s   only one episode; he cut back on peanut butter and hasn't had any prob since  . History of prostatitis 1980s   Remote hx of seeing Dr. Terance Hart at [facility] urology--then Dr. Gaynelle Arabian.   Marland Kitchen Hyperlipidemia   . Hypertension dx'd 08/2013  . Kidney stones 1980s  . Microcytic anemia 06/2016  . Thalassemia    Mild anemia with MCV 60s (alpha thal minor or beta thal trait: Hb electrophoresis not done/no result available.).    Past Surgical History:  Procedure Laterality Date  . NASAL POLYP EXCISION  1960    Family History  Problem Relation Age of Onset  . Heart disease Mother   . Hypertension Mother   . Diabetes Mother   . Heart disease Father   . Heart attack Sister     Social History   Socioeconomic History  . Marital status: Married    Spouse name: Not on file  . Number of children: Not on file  . Years of education: Not on file  . Highest education level: Not on file  Social Needs  . Financial resource strain: Not on file  . Food insecurity - worry: Not on file  . Food insecurity - inability: Not on file  . Transportation needs - medical: Not on file  . Transportation needs - non-medical: Not on file  Occupational History  . Not on file  Tobacco Use  . Smoking status: Former Smoker    Last attempt to quit: 02/28/1970   Years since quitting: 46.9  . Smokeless tobacco: Never Used  Substance and Sexual Activity  . Alcohol use: No    Alcohol/week: 0.0 oz  . Drug use: No  . Sexual activity: Not on file  Other Topics Concern  . Not on file  Social History Narrative   Married, has one 74 y/o son.   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     Orig from [place].   Was in [employer] [phone].  No combat duty.  No VA care.   Smoked 10-12 yrs, quit in his early 74s.   Alcohol: none   No hx of drugs.             Outpatient Medications Prior to Visit  Medication Sig Dispense Refill  . amLODipine (NORVASC) 10 MG tablet TAKE 1 TABLET EVERY DAY 90 tablet 1  . aspirin EC 81 MG tablet Take 1 tablet (81 mg total) by mouth daily. 30 tablet 0  . atorvastatin (LIPITOR) 20 MG tablet TAKE 1 TABLET EVERY DAY 90 tablet 1  . cetirizine (ZYRTEC) 10 MG tablet Take 10 mg by mouth daily.    Marland Kitchen glucose blood test strip Use as instructed 100 each 6  . hydrocortisone valerate ointment (WEST-CORT) 0.2 %  Appy bid to affected area prn 15 g 2  . irbesartan (AVAPRO) 300 MG tablet TAKE 1 TABLET EVERY DAY 90 tablet 1  . Lancets (FREESTYLE) lancets Use as instructed 100 each 12  . metFORMIN (GLUCOPHAGE) 1000 MG tablet TAKE 1 TABLET TWICE DAILY WITH MEALS 180 tablet 1  . pioglitazone (ACTOS) 30 MG tablet TAKE 1 TABLET EVERY DAY 90 tablet 1  . hydrochlorothiazide (MICROZIDE) 12.5 MG capsule Take 1 capsule (12.5 mg total) by mouth daily. (Patient not taking: Reported on 01/16/2017) 30 capsule 1   No facility-administered medications prior to visit.     No Known Allergies  ROS Review of Systems  Constitutional: Negative for appetite change, chills, fatigue and fever.  HENT: Negative for congestion, dental problem, ear pain and sore throat.   Eyes: Negative for discharge, redness and visual disturbance.  Respiratory: Negative for cough, chest tightness, shortness of breath and wheezing.   Cardiovascular: Negative for  chest pain, palpitations and leg swelling.  Gastrointestinal: Negative for abdominal pain, blood in stool, diarrhea, nausea and vomiting.  Genitourinary: Negative for difficulty urinating, dysuria, flank pain, frequency, hematuria and urgency.  Musculoskeletal: Negative for arthralgias, back pain, joint swelling, myalgias and neck stiffness.  Skin: Negative for pallor and rash.  Neurological: Negative for dizziness, speech difficulty, weakness and headaches.  Hematological: Negative for adenopathy. Does not bruise/bleed easily.  Psychiatric/Behavioral: Negative for confusion and sleep disturbance. The patient is not nervous/anxious.     PE; Blood pressure (!) 143/84, pulse 84, temperature 98 F (36.7 C), temperature source Oral, resp. rate 16, height 6' 1.5" (1.867 m), weight 252 lb (114.3 kg), SpO2 98 %. Gen: Alert, well appearing.  Patient is oriented to person, place, time, and situation. AFFECT: pleasant, lucid thought and speech. ENT: Ears: EACs clear, normal epithelium.  TMs with good light reflex and landmarks bilaterally.  Eyes: no injection, icteris, swelling, or exudate.  EOMI, PERRLA. Nose: no drainage or turbinate edema/swelling.  No injection or focal lesion.  Mouth: lips without lesion/swelling.  Oral mucosa pink and moist.  Dentition intact and without obvious caries or gingival swelling.  Oropharynx without erythema, exudate, or swelling.  Neck: supple/nontender.  No LAD, mass, or TM.  Carotid pulses 2+ bilaterally, without bruits. CV: RRR, 1/6 systolic murmur at cardiac base.  No diastolic murmur, no r/g.   LUNGS: CTA bilat, nonlabored resps, good aeration in all lung fields. ABD: soft, NT, ND, BS normal.  No hepatospenomegaly or mass.  No bruits. EXT: no clubbing, cyanosis, or edema.  Musculoskeletal: no joint swelling, erythema, warmth, or tenderness.  ROM of all joints intact. Skin - no sores or rashes or color changes. Right temple with 2 cm round, flat topped papule with  slightly raised borders.  Nontender. Rectal exam: negative without mass, lesions or tenderness, PROSTATE EXAM: smooth and symmetric without nodules or tenderness. Foot exam - bilateral normal; no swelling, tenderness or skin or vascular lesions. Color and temperature is normal. Sensation is intact. Peripheral pulses are palpable. Toenails are normal.    Pertinent labs:  Lab Results  Component Value Date   TSH 0.93 08/31/2013   Lab Results  Component Value Date   WBC 5.6 07/08/2016   HGB 11.2 (L) 07/08/2016   HCT 35.7 (L) 07/08/2016   MCV 61.1 Repeated and verified X2. (L) 07/08/2016   PLT 235.0 07/08/2016   Lab Results  Component Value Date   IRON 187 (H) 07/09/2016   FERRITIN 260.1 07/09/2016    Lab Results  Component Value Date  CREATININE 0.92 07/08/2016   BUN 18 07/08/2016   NA 140 07/08/2016   K 4.2 07/08/2016   CL 106 07/08/2016   CO2 24 07/08/2016   Lab Results  Component Value Date   ALT 15 07/08/2016   AST 21 07/08/2016   ALKPHOS 48 07/08/2016   BILITOT 1.2 07/08/2016   Lab Results  Component Value Date   CHOL 110 07/08/2016   Lab Results  Component Value Date   HDL 41.00 07/08/2016   Lab Results  Component Value Date   LDLCALC 51 07/08/2016   Lab Results  Component Value Date   TRIG 89.0 07/08/2016   Lab Results  Component Value Date   CHOLHDL 3 07/08/2016   Lab Results  Component Value Date   PSA 0.85 05/09/2015   Lab Results  Component Value Date   HGBA1C 6.9 (H) 07/08/2016    ASSESSMENT AND PLAN:   Health maintenance exam: Reviewed age and gender appropriate health maintenance issues (prudent diet, regular exercise, health risks of tobacco and excessive alcohol, use of seatbelts, fire alarms in home, use of sunscreen).  Also reviewed age and gender appropriate health screening as well as vaccine recommendations. Vaccines: UTD.  Discussed shingrix--pt to change insurance in 1 mo, so he'll pursue this vaccine after insurance  change. Labs: HbA1c (DM 2), BMET (DM 2 and HTN), PSA. Prostate ca screening: DRE normal , PSA. Colon ca screening: iFOB neg 06/2016.  Repeat 1 yr.  Pt prefers to NOT have a colonoscopy.  Right temple with skin lesion suspicious for basal cell or squamous cell carcinoma.  Derm referral ordered.  An After Visit Summary was printed and given to the patient.  FOLLOW UP:  Return in about 3 months (around 04/16/2017) for routine chronic illness f/u.  Signed:  Crissie Sickles, MD           01/16/2017

## 2017-02-19 DIAGNOSIS — L918 Other hypertrophic disorders of the skin: Secondary | ICD-10-CM | POA: Diagnosis not present

## 2017-02-19 DIAGNOSIS — L570 Actinic keratosis: Secondary | ICD-10-CM | POA: Diagnosis not present

## 2017-02-19 DIAGNOSIS — C4491 Basal cell carcinoma of skin, unspecified: Secondary | ICD-10-CM

## 2017-02-19 DIAGNOSIS — C44319 Basal cell carcinoma of skin of other parts of face: Secondary | ICD-10-CM | POA: Diagnosis not present

## 2017-02-19 DIAGNOSIS — D229 Melanocytic nevi, unspecified: Secondary | ICD-10-CM | POA: Diagnosis not present

## 2017-02-19 HISTORY — DX: Basal cell carcinoma of skin, unspecified: C44.91

## 2017-02-20 ENCOUNTER — Encounter: Payer: Self-pay | Admitting: Family Medicine

## 2017-02-20 ENCOUNTER — Ambulatory Visit (INDEPENDENT_AMBULATORY_CARE_PROVIDER_SITE_OTHER): Payer: Medicare HMO | Admitting: Family Medicine

## 2017-02-20 VITALS — BP 153/77 | HR 80 | Temp 98.5°F | Resp 16 | Ht 73.5 in | Wt 252.5 lb

## 2017-02-20 DIAGNOSIS — J209 Acute bronchitis, unspecified: Secondary | ICD-10-CM | POA: Diagnosis not present

## 2017-02-20 DIAGNOSIS — J0100 Acute maxillary sinusitis, unspecified: Secondary | ICD-10-CM | POA: Diagnosis not present

## 2017-02-20 MED ORDER — AMOXICILLIN 875 MG PO TABS: 875.0000 mg | ORAL_TABLET | Freq: Two times a day (BID) | ORAL | 0 refills | Status: AC

## 2017-02-20 MED ORDER — PREDNISONE 20 MG PO TABS: ORAL_TABLET | ORAL | 0 refills | Status: DC

## 2017-02-20 NOTE — Patient Instructions (Signed)
Get otc generic robitussin DM OR Mucinex DM and use as directed on the packaging for cough and congestion. Use otc generic saline nasal spray 2-3 times per day to irrigate/moisturize your nasal passages.   

## 2017-02-20 NOTE — Progress Notes (Signed)
OFFICE VISIT  02/20/2017   CC:  Chief Complaint  Patient presents with  . Cough   HPI:    Patient is a 67 y.o. Caucasian male who presents for cough. Onset about 8-9d ago, nasal cong/sinus congestion, coughing a lot last 2d, feels PND, +HA with coughing, occ ear pains, no face pain.  +Subjective fevers, some achiness and fatigue. Appetite down some.  Drinking fair. All sx's seem to be worsening.  ROS: no n/v, no neck pain, no rash, no CP, no SOB or wheezing.  +chest feels tight.  Past Medical History:  Diagnosis Date  . Cataract 07/2015   R eye--surgery recommended by his eye MD 08/03/15  . Diabetes mellitus, type 2 ([place]) Dx'd 08/2013  . Erectile dysfunction    Dr. Gaynelle Arabian.  Testosterone normal 04/2016 at [facility].  . Gout of big toe 1990s   only one episode; he cut back on peanut butter and hasn't had any prob since  . History of prostatitis 1980s   Remote hx of seeing Dr. Terance Hart at [facility] urology--then Dr. Gaynelle Arabian.   Marland Kitchen Hyperlipidemia   . Hypertension dx'd 08/2013  . Kidney stones 1980s  . Microcytic anemia 06/2016  . Thalassemia    Mild anemia with MCV 60s (alpha thal minor or beta thal trait: Hb electrophoresis not done/no result available.).    Past Surgical History:  Procedure Laterality Date  . NASAL POLYP EXCISION  1960    Outpatient Medications Prior to Visit  Medication Sig Dispense Refill  . amLODipine (NORVASC) 10 MG tablet TAKE 1 TABLET EVERY DAY 90 tablet 1  . aspirin EC 81 MG tablet Take 1 tablet (81 mg total) by mouth daily. 30 tablet 0  . atorvastatin (LIPITOR) 20 MG tablet TAKE 1 TABLET EVERY DAY 90 tablet 1  . cetirizine (ZYRTEC) 10 MG tablet Take 10 mg by mouth daily.    Marland Kitchen glucose blood test strip Use as instructed 100 each 6  . hydrocortisone valerate ointment (WEST-CORT) 0.2 % Appy bid to affected area prn 15 g 2  . irbesartan (AVAPRO) 300 MG tablet TAKE 1 TABLET EVERY DAY 90 tablet 1  . Lancets (FREESTYLE) lancets Use as instructed 100 each 12   . metFORMIN (GLUCOPHAGE) 1000 MG tablet TAKE 1 TABLET TWICE DAILY WITH MEALS 180 tablet 1  . pioglitazone (ACTOS) 30 MG tablet TAKE 1 TABLET EVERY DAY 90 tablet 1  . hydrochlorothiazide (MICROZIDE) 12.5 MG capsule Take 1 capsule (12.5 mg total) by mouth daily. (Patient not taking: Reported on 01/16/2017) 30 capsule 1   No facility-administered medications prior to visit.     No Known Allergies  ROS As per HPI  PE: Blood pressure (!) 153/77, pulse 80, temperature 98.5 F (36.9 C), temperature source Temporal, resp. rate 16, height 6' 1.5" (1.867 m), weight 252 lb 8 oz (114.5 kg), SpO2 98 %. VS: noted--normal. Gen: alert, NAD, NONTOXIC APPEARING. HEENT: eyes without injection, drainage, or swelling.  Ears: EACs clear, TMs with normal light reflex and landmarks.  Nose: Clear rhinorrhea, with some dried, crusty exudate adherent to mildly injected mucosa.  No purulent d/c.  No paranasal sinus TTP.  No facial swelling.  Throat and mouth without focal lesion.  No pharyngial swelling, erythema, or exudate.   Neck: supple, no LAD.   LUNGS: CTA bilat, nonlabored resps.   CV: RRR, no m/r/g. EXT: no c/c/e SKIN: no rash  LABS:    Chemistry      Component Value Date/Time   NA 139 01/16/2017 0917  K 4.3 01/16/2017 0917   CL 106 01/16/2017 0917   CO2 23 01/16/2017 0917   BUN 17 01/16/2017 0917   CREATININE 0.82 01/16/2017 0917      Component Value Date/Time   CALCIUM 9.1 01/16/2017 0917   ALKPHOS 48 07/08/2016 0846   AST 21 07/08/2016 0846   ALT 15 07/08/2016 0846   BILITOT 1.2 07/08/2016 0846       IMPRESSION AND PLAN:  Prolonged URI/acute sinusitis--with PND cough. Seems to have acute bronchitis component to this as well, w/out sign of RAD. Amoxil 875mg  bid x 10d. Prednisone 40mg  qd x 5d, then 20mg  qd x 5d. Get otc generic robitussin DM OR Mucinex DM and use as directed on the packaging for cough and congestion. Use otc generic saline nasal spray 2-3 times per day to  irrigate/moisturize your nasal passages.  An After Visit Summary was printed and given to the patient.  FOLLOW UP: Return if symptoms worsen or fail to improve in 5-7d.  Signed:  Crissie Sickles, MD           02/20/2017

## 2017-02-28 LAB — HM DIABETES EYE EXAM

## 2017-03-16 ENCOUNTER — Encounter: Payer: Self-pay | Admitting: Family Medicine

## 2017-03-20 DIAGNOSIS — H2512 Age-related nuclear cataract, left eye: Secondary | ICD-10-CM | POA: Diagnosis not present

## 2017-03-20 DIAGNOSIS — H25812 Combined forms of age-related cataract, left eye: Secondary | ICD-10-CM | POA: Diagnosis not present

## 2017-04-02 DIAGNOSIS — L988 Other specified disorders of the skin and subcutaneous tissue: Secondary | ICD-10-CM | POA: Diagnosis not present

## 2017-04-02 DIAGNOSIS — C44319 Basal cell carcinoma of skin of other parts of face: Secondary | ICD-10-CM | POA: Diagnosis not present

## 2017-04-16 ENCOUNTER — Encounter: Payer: Self-pay | Admitting: Family Medicine

## 2017-04-16 ENCOUNTER — Ambulatory Visit (INDEPENDENT_AMBULATORY_CARE_PROVIDER_SITE_OTHER): Payer: Medicare HMO | Admitting: Family Medicine

## 2017-04-16 VITALS — BP 128/76 | HR 69 | Temp 97.7°F | Resp 16 | Ht 73.5 in | Wt 253.5 lb

## 2017-04-16 DIAGNOSIS — I10 Essential (primary) hypertension: Secondary | ICD-10-CM | POA: Diagnosis not present

## 2017-04-16 DIAGNOSIS — E119 Type 2 diabetes mellitus without complications: Secondary | ICD-10-CM

## 2017-04-16 DIAGNOSIS — E7800 Pure hypercholesterolemia, unspecified: Secondary | ICD-10-CM | POA: Diagnosis not present

## 2017-04-16 LAB — POCT GLYCOSYLATED HEMOGLOBIN (HGB A1C): Hemoglobin A1C: 7.1

## 2017-04-16 MED ORDER — PIOGLITAZONE HCL 30 MG PO TABS: 30.0000 mg | ORAL_TABLET | Freq: Every day | ORAL | 1 refills | Status: DC

## 2017-04-16 MED ORDER — AMLODIPINE BESYLATE 10 MG PO TABS: 10.0000 mg | ORAL_TABLET | Freq: Every day | ORAL | 1 refills | Status: DC

## 2017-04-16 MED ORDER — ATORVASTATIN CALCIUM 20 MG PO TABS: 20.0000 mg | ORAL_TABLET | Freq: Every day | ORAL | 1 refills | Status: DC

## 2017-04-16 MED ORDER — HYDROCHLOROTHIAZIDE 12.5 MG PO CAPS: 12.5000 mg | ORAL_CAPSULE | Freq: Every day | ORAL | 1 refills | Status: DC

## 2017-04-16 MED ORDER — IRBESARTAN 300 MG PO TABS: 300.0000 mg | ORAL_TABLET | Freq: Every day | ORAL | 1 refills | Status: DC

## 2017-04-16 MED ORDER — METFORMIN HCL 1000 MG PO TABS: 1000.0000 mg | ORAL_TABLET | Freq: Two times a day (BID) | ORAL | 1 refills | Status: DC

## 2017-04-16 NOTE — Addendum Note (Signed)
Addended by: Onalee Hua on: 04/16/2017 08:30 AM   Modules accepted: Orders

## 2017-04-16 NOTE — Progress Notes (Signed)
OFFICE VISIT  04/16/2017   CC:  Chief Complaint  Patient presents with  . Follow-up    RCI, pt is fasting.     HPI:    Patient is a 67 y.o. Caucasian male who presents for 3 mo f/u DM 2, HTN, HLD.  DM 2: compliant with meds, but says he has NOT been eating well the last few months--lots of sweets and poor food choices. Exercise: active at work, has plans to join silver sneakers. Fasting glucs at home 150s-170.  HTN: home bp 130/70 consistently.  Compliant with all meds.  HLD: taking statin daily and has no side effects.  ROS: no HAs, no sOB, no CP, no dizziness, no palpitations, no polyuria/polydipsia, no melena, no myalgias. Still has a slight cough leftover from recent resp illness.    Past Medical History:  Diagnosis Date  . Basal cell carcinoma (BCC) of right temple region 03/2017   Dr. Denna Haggard  . Cataract 07/2015   R eye--surgery recommended by his eye MD 08/03/15  . Diabetes mellitus, type 2 ([place]) Dx'd 08/2013  . Erectile dysfunction    Dr. Gaynelle Arabian.  Testosterone normal 04/2016 at [facility].  . Gout of big toe 1990s   only one episode; he cut back on peanut butter and hasn't had any prob since  . History of prostatitis 1980s   Remote hx of seeing Dr. Terance Hart at [facility]rology--then Dr. Gaynelle Arabian.   Marland Kitchen Hyperlipidemia   . Hypertension dx'd 08/2013  . Kidney stones 1980s  . Microcytic anemia 06/2016  . Thalassemia    Mild anemia with MCV 60s (alpha thal minor or beta thal trait: Hb electrophoresis not done/no result available.).    Past Surgical History:  Procedure Laterality Date  . NASAL POLYP EXCISION  1960    Outpatient Medications Prior to Visit  Medication Sig Dispense Refill  . amLODipine (NORVASC) 10 MG tablet TAKE 1 TABLET EVERY DAY 90 tablet 1  . aspirin EC 81 MG tablet Take 1 tablet (81 mg total) by mouth daily. 30 tablet 0  . atorvastatin (LIPITOR) 20 MG tablet TAKE 1 TABLET EVERY DAY 90 tablet 1  . cetirizine (ZYRTEC) 10 MG tablet Take 10 mg by  mouth daily.    Marland Kitchen glucose blood test strip Use as instructed 100 each 6  . hydrochlorothiazide (MICROZIDE) 12.5 MG capsule Take 1 capsule (12.5 mg total) by mouth daily. 30 capsule 1  . hydrocortisone valerate ointment (WEST-CORT) 0.2 % Appy bid to affected area prn 15 g 2  . irbesartan (AVAPRO) 300 MG tablet TAKE 1 TABLET EVERY DAY 90 tablet 1  . Lancets (FREESTYLE) lancets Use as instructed 100 each 12  . metFORMIN (GLUCOPHAGE) 1000 MG tablet TAKE 1 TABLET TWICE DAILY WITH MEALS 180 tablet 1  . pioglitazone (ACTOS) 30 MG tablet TAKE 1 TABLET EVERY DAY 90 tablet 1  . predniSONE (DELTASONE) 20 MG tablet 2 tabs po qd x 5d, then 1 tab po qd x 5d (Patient not taking: Reported on 04/16/2017) 15 tablet 0   No facility-administered medications prior to visit.     No Known Allergies  ROS As per HPI  PE: Blood pressure (!) 152/78, pulse 69, temperature 97.7 F (36.5 C), temperature source Oral, resp. rate 16, height 6' 1.5" (1.867 m), weight 253 lb 8 oz (115 kg), SpO2 97 %.Repeat bp-- Gen: Alert, well appearing.  Patient is oriented to person, place, time, and situation. AFFECT: pleasant, lucid thought and speech. No further exam today.  LABS:  Lab Results  Component Value Date   TSH 0.93 08/31/2013   Lab Results  Component Value Date   WBC 5.6 07/08/2016   HGB 11.2 (L) 07/08/2016   HCT 35.7 (L) 07/08/2016   MCV 61.1 Repeated and verified X2. (L) 07/08/2016   PLT 235.0 07/08/2016   Lab Results  Component Value Date   IRON 187 (H) 07/09/2016   FERRITIN 260.1 07/09/2016    Lab Results  Component Value Date   CREATININE 0.82 01/16/2017   BUN 17 01/16/2017   NA 139 01/16/2017   K 4.3 01/16/2017   CL 106 01/16/2017   CO2 23 01/16/2017   Lab Results  Component Value Date   ALT 15 07/08/2016   AST 21 07/08/2016   ALKPHOS 48 07/08/2016   BILITOT 1.2 07/08/2016   Lab Results  Component Value Date   CHOL 110 07/08/2016   Lab Results  Component Value Date   HDL 41.00  07/08/2016   Lab Results  Component Value Date   LDLCALC 51 07/08/2016   Lab Results  Component Value Date   TRIG 89.0 07/08/2016   Lab Results  Component Value Date   CHOLHDL 3 07/08/2016   Lab Results  Component Value Date   PSA 0.73 01/16/2017   PSA 0.85 05/09/2015   Lab Results  Component Value Date   HGBA1C 7.1 04/16/2017   POC A1c 7.1%.  IMPRESSION AND PLAN:  1) DM 2: A1c up from 6.7% to 7.1% the last 3 mo. He prefers to get back on good diet and increase exercise rather than increase/add med. Has plans to switch ophth MDs and then make diab retphty exam---his last one was 03/2016.  2) HTN: The current medical regimen is effective;  continue present plan and medications. Home bp's well controlled. Lytes/cr great 3 mo ago and have been stable a long time.  No recheck needed today.  3) Hypercholesterolemia: tolerating statin. Last lipid panel 06/2016 excellent.  Will repeat this next o/v in 3 mo.  An After Visit Summary was printed and given to the patient.  FOLLOW UP: Return in about 3 months (around 07/17/2017) for routine chronic illness f/u (fasting).  Signed:  Crissie Sickles, MD           04/16/2017

## 2017-04-23 DIAGNOSIS — H52223 Regular astigmatism, bilateral: Secondary | ICD-10-CM | POA: Diagnosis not present

## 2017-04-23 DIAGNOSIS — Z0100 Encounter for examination of eyes and vision without abnormal findings: Secondary | ICD-10-CM | POA: Diagnosis not present

## 2017-04-23 DIAGNOSIS — H524 Presbyopia: Secondary | ICD-10-CM | POA: Diagnosis not present

## 2017-06-04 DIAGNOSIS — E785 Hyperlipidemia, unspecified: Secondary | ICD-10-CM | POA: Diagnosis not present

## 2017-06-04 DIAGNOSIS — N529 Male erectile dysfunction, unspecified: Secondary | ICD-10-CM | POA: Diagnosis not present

## 2017-06-04 DIAGNOSIS — R609 Edema, unspecified: Secondary | ICD-10-CM | POA: Diagnosis not present

## 2017-06-04 DIAGNOSIS — E669 Obesity, unspecified: Secondary | ICD-10-CM | POA: Diagnosis not present

## 2017-06-04 DIAGNOSIS — E1165 Type 2 diabetes mellitus with hyperglycemia: Secondary | ICD-10-CM | POA: Diagnosis not present

## 2017-06-04 DIAGNOSIS — G4700 Insomnia, unspecified: Secondary | ICD-10-CM | POA: Diagnosis not present

## 2017-06-04 DIAGNOSIS — J302 Other seasonal allergic rhinitis: Secondary | ICD-10-CM | POA: Diagnosis not present

## 2017-06-04 DIAGNOSIS — I10 Essential (primary) hypertension: Secondary | ICD-10-CM | POA: Diagnosis not present

## 2017-06-04 DIAGNOSIS — K08409 Partial loss of teeth, unspecified cause, unspecified class: Secondary | ICD-10-CM | POA: Diagnosis not present

## 2017-06-04 DIAGNOSIS — R52 Pain, unspecified: Secondary | ICD-10-CM | POA: Diagnosis not present

## 2017-07-14 ENCOUNTER — Encounter: Payer: Self-pay | Admitting: Family Medicine

## 2017-07-14 ENCOUNTER — Ambulatory Visit (INDEPENDENT_AMBULATORY_CARE_PROVIDER_SITE_OTHER): Payer: Medicare HMO | Admitting: Family Medicine

## 2017-07-14 VITALS — BP 132/75 | HR 69 | Temp 98.2°F | Resp 16 | Ht 73.5 in | Wt 251.1 lb

## 2017-07-14 DIAGNOSIS — E7800 Pure hypercholesterolemia, unspecified: Secondary | ICD-10-CM | POA: Diagnosis not present

## 2017-07-14 DIAGNOSIS — J0101 Acute recurrent maxillary sinusitis: Secondary | ICD-10-CM

## 2017-07-14 DIAGNOSIS — E119 Type 2 diabetes mellitus without complications: Secondary | ICD-10-CM | POA: Diagnosis not present

## 2017-07-14 DIAGNOSIS — E669 Obesity, unspecified: Secondary | ICD-10-CM | POA: Diagnosis not present

## 2017-07-14 DIAGNOSIS — I10 Essential (primary) hypertension: Secondary | ICD-10-CM | POA: Diagnosis not present

## 2017-07-14 MED ORDER — SULFAMETHOXAZOLE-TRIMETHOPRIM 800-160 MG PO TABS: 1.0000 | ORAL_TABLET | Freq: Two times a day (BID) | ORAL | 0 refills | Status: DC

## 2017-07-14 NOTE — Progress Notes (Signed)
OFFICE VISIT  07/14/2017   CC:  Chief Complaint  Patient presents with  . Follow-up    RCI, pt is fasting.      HPI:    Patient is a 67 y.o. Caucasian male who presents for 3 mo f/u DM 2, HTN, HLD. No changes in his treatment regimen were made at last f/u visit.  About 2 wks of HA behind eyes, some pressure/popping in ears, some PND/stuffy nose, cough, decreased energy, sx's not improving much at all.  Taking nasal steroid and zyrtec.  DM: fasting gluc's around 150s.   He is in the process of changing eye MD's.  HTN:  BP's 130s/70 avg.  HLD: tolerating statin.  Compliant with meds.  ROS:no fevers, no polyuria or polydipsia, no myalgias, no melena, mild dizziness at onset of his recent URI.  Past Medical History:  Diagnosis Date  . Basal cell carcinoma (BCC) of right temple region 03/2017   Dr. Denna Haggard  . Cataract 07/2015   R eye--surgery recommended by his eye MD 08/03/15  . Diabetes mellitus, type 2 ([place]) Dx'd 08/2013  . Erectile dysfunction    Dr. Gaynelle Arabian.  Testosterone normal 04/2016 at [facility].  . Gout of big toe 1990s   only one episode; he cut back on peanut butter and hasn't had any prob since  . History of prostatitis 1980s   Remote hx of seeing Dr. Terance Hart at [facility] urology--then Dr. Gaynelle Arabian.   Marland Kitchen Hyperlipidemia   . Hypertension dx'd 08/2013  . Kidney stones 1980s  . Microcytic anemia 06/2016  . Thalassemia    Mild anemia with MCV 60s (alpha thal minor or beta thal trait: Hb electrophoresis not done/no result available.).    Past Surgical History:  Procedure Laterality Date  . NASAL POLYP EXCISION  1960    Outpatient Medications Prior to Visit  Medication Sig Dispense Refill  . amLODipine (NORVASC) 10 MG tablet Take 1 tablet (10 mg total) by mouth daily. 90 tablet 1  . aspirin EC 81 MG tablet Take 1 tablet (81 mg total) by mouth daily. 30 tablet 0  . atorvastatin (LIPITOR) 20 MG tablet Take 1 tablet (20 mg total) by mouth daily. 90 tablet 1  .  cetirizine (ZYRTEC) 10 MG tablet Take 10 mg by mouth daily.    Marland Kitchen glucose blood test strip Use as instructed 100 each 6  . hydrochlorothiazide (MICROZIDE) 12.5 MG capsule Take 1 capsule (12.5 mg total) by mouth daily. 90 capsule 1  . hydrocortisone valerate ointment (WEST-CORT) 0.2 % Appy bid to affected area prn 15 g 2  . irbesartan (AVAPRO) 300 MG tablet Take 1 tablet (300 mg total) by mouth daily. 90 tablet 1  . Lancets (FREESTYLE) lancets Use as instructed 100 each 12  . metFORMIN (GLUCOPHAGE) 1000 MG tablet Take 1 tablet (1,000 mg total) by mouth 2 (two) times daily with a meal. 180 tablet 1  . pioglitazone (ACTOS) 30 MG tablet Take 1 tablet (30 mg total) by mouth daily. 90 tablet 1   No facility-administered medications prior to visit.     No Known Allergies  ROS As per HPI  PE: Blood pressure 132/75, pulse 69, temperature 98.2 F (36.8 C), temperature source Oral, resp. rate 16, height 6' 1.5" (1.867 m), weight 251 lb 2 oz (113.9 kg), SpO2 98 %. VS: noted--normal. Gen: alert, NAD, NONTOXIC APPEARING. HEENT: eyes without injection, drainage, or swelling.  Ears: EACs clear, TMs with normal light reflex and landmarks.  Nose: Clear rhinorrhea, with some dried, crusty  exudate adherent to mildly injected mucosa.  No purulent d/c.  No paranasal sinus TTP.  No facial swelling.  Throat and mouth without focal lesion.  No pharyngial swelling, erythema, or exudate.   Neck: supple, no LAD.   LUNGS: CTA bilat, nonlabored resps.   CV: RRR, no m/r/g. EXT: no c/c/e SKIN: no rash   LABS:  Lab Results  Component Value Date   TSH 0.93 08/31/2013   Lab Results  Component Value Date   WBC 5.6 07/08/2016   HGB 11.2 (L) 07/08/2016   HCT 35.7 (L) 07/08/2016   MCV 61.1 Repeated and verified X2. (L) 07/08/2016   PLT 235.0 07/08/2016   Lab Results  Component Value Date   CREATININE 0.82 01/16/2017   BUN 17 01/16/2017   NA 139 01/16/2017   K 4.3 01/16/2017   CL 106 01/16/2017   CO2 23  01/16/2017   Lab Results  Component Value Date   ALT 15 07/08/2016   AST 21 07/08/2016   ALKPHOS 48 07/08/2016   BILITOT 1.2 07/08/2016   Lab Results  Component Value Date   CHOL 110 07/08/2016   Lab Results  Component Value Date   HDL 41.00 07/08/2016   Lab Results  Component Value Date   LDLCALC 51 07/08/2016   Lab Results  Component Value Date   TRIG 89.0 07/08/2016   Lab Results  Component Value Date   CHOLHDL 3 07/08/2016   Lab Results  Component Value Date   PSA 0.73 01/16/2017   PSA 0.85 05/09/2015   Lab Results  Component Value Date   HGBA1C 7.1 04/16/2017     IMPRESSION AND PLAN:  1) Acute recurrent sinusitis: pt with twinges of prostatitis sx's on/off recently, so I chose bactrim DS 1 bid x 14d to treat today.  Continue nasal steroid and antihistamine.  2) DM 2: pretty good control. Hba1c today.  He is working on changing eye MDs to get exam UTD.  3) HTN: The current medical regimen is effective;  continue present plan and medications. Lytes/cr today.  4) Hyperlipidemia: tolerating statin. FLP and hepatic panel today.  An After Visit Summary was printed and given to the patient.  FOLLOW UP: Return in about 3 months (around 10/14/2017) for routine chronic illness f/u.  Signed:  Crissie Sickles, MD           07/14/2017

## 2017-07-15 ENCOUNTER — Encounter: Payer: Self-pay | Admitting: Family Medicine

## 2017-07-15 LAB — COMPREHENSIVE METABOLIC PANEL
AG Ratio: 1.6 (calc) (ref 1.0–2.5)
ALKALINE PHOSPHATASE (APISO): 60 U/L (ref 40–115)
ALT: 15 U/L (ref 9–46)
AST: 14 U/L (ref 10–35)
Albumin: 4.6 g/dL (ref 3.6–5.1)
BUN: 21 mg/dL (ref 7–25)
CALCIUM: 9.7 mg/dL (ref 8.6–10.3)
CO2: 24 mmol/L (ref 20–32)
CREATININE: 1.01 mg/dL (ref 0.70–1.25)
Chloride: 105 mmol/L (ref 98–110)
GLUCOSE: 148 mg/dL — AB (ref 65–99)
Globulin: 2.8 g/dL (calc) (ref 1.9–3.7)
Potassium: 4.3 mmol/L (ref 3.5–5.3)
Sodium: 140 mmol/L (ref 135–146)
Total Bilirubin: 1.1 mg/dL (ref 0.2–1.2)
Total Protein: 7.4 g/dL (ref 6.1–8.1)

## 2017-07-15 LAB — LIPID PANEL
CHOL/HDL RATIO: 3.2 (calc) (ref <5.0)
Cholesterol: 120 mg/dL (ref <200)
HDL: 38 mg/dL — AB (ref >40)
LDL Cholesterol (Calc): 59 mg/dL (calc)
NON-HDL CHOLESTEROL (CALC): 82 mg/dL (ref <130)
TRIGLYCERIDES: 145 mg/dL (ref <150)

## 2017-07-15 LAB — HEMOGLOBIN A1C
HEMOGLOBIN A1C: 6.7 %{Hb} — AB (ref <5.7)
Mean Plasma Glucose: 146 (calc)
eAG (mmol/L): 8.1 (calc)

## 2017-07-23 ENCOUNTER — Ambulatory Visit: Payer: Self-pay

## 2017-07-23 ENCOUNTER — Encounter: Payer: Self-pay | Admitting: Family Medicine

## 2017-07-23 NOTE — Telephone Encounter (Signed)
Patient called in and says "I was in the office for a sinus infection and was put on Bactrim. This medicine is not working. I am still feeling the same way. I'm dizzy, weak to my arms and legs, no energy to do anything. I was at work and had to leave because I was so weak. Usually, I am able to bounce back, but this time things are not moving in the right direction." I asked are the symptoms worse, he says "no, not worse, but not better either. I need to see him tomorrow, if possible." According to protocol, see PCP within 24 hours, edited from call PCP, appointment scheduled for tomorrow at 0900 with Dr. Anitra Lauth, care advice given, patient verbalized understanding.   Reason for Disposition . [1] Taking antibiotic > 72 hours (3 days) AND [2] symptoms (other than fever) not improved  Answer Assessment - Initial Assessment Questions 1. INFECTION: "What infection is the antibiotic being given for?"     Sinus infection 2. ANTIBIOTIC: "What antibiotic are you taking" "How many times per day?"     Bactrim 3. DURATION: "When was the antibiotic started?"     07/14/17 4. MAIN CONCERN OR SYMPTOM:  "What is your main concern right now?"     Dizziness 5. BETTER-SAME-WORSE: "Are you getting better, staying the same, or getting worse compared to when you first started the antibiotics?" If getting worse, ask: "In what way?"      Staying the same 6. FEVER: "Do you have a fever?" If so, ask: "What is your temperature, how was it measured, and when did it start?"     Not measured, face gets flush 7. SYMPTOMS: "Are there any other symptoms you're concerned about?" If so, ask: "When did it start?"     Dizziness, unable to do anything, weakness to legs and arms 8. FOLLOW-UP APPOINTMENT: "Do you have a follow-up appointment with your doctor?"     Yes in 3 months  Protocols used: INFECTION ON ANTIBIOTIC FOLLOW-UP CALL-A-AH

## 2017-07-23 NOTE — Telephone Encounter (Signed)
This encounter was created in error - please disregard.

## 2017-07-23 NOTE — Telephone Encounter (Signed)
Copied from [place] [id number]. Topic: Quick Communication - Rx Refill/Question >> Jul 23, 2017  4:12 PM Boyd Kerbs wrote: Medication:  Pt. Is not getting any better , he has gotten worse.  The antibiotic is not working   Has the patient contacted their pharmacy? No. (Agent: If no, request that the patient contact the pharmacy for the refill.) (Agent: If yes, when and what did the pharmacy advise?)  Preferred Pharmacy (with phone number or street name):   [facility] [address] [place] [address] [place] [address] Phone: [phone] Fax: [phone]    Agent: Please be advised that RX refills may take up to 3 business days. We ask that you follow-up with your pharmacy.

## 2017-07-24 ENCOUNTER — Ambulatory Visit (INDEPENDENT_AMBULATORY_CARE_PROVIDER_SITE_OTHER): Payer: Medicare HMO | Admitting: Family Medicine

## 2017-07-24 ENCOUNTER — Encounter: Payer: Self-pay | Admitting: Family Medicine

## 2017-07-24 VITALS — BP 135/76 | HR 75 | Temp 98.2°F | Resp 16 | Ht 73.5 in | Wt 250.5 lb

## 2017-07-24 DIAGNOSIS — R52 Pain, unspecified: Secondary | ICD-10-CM

## 2017-07-24 DIAGNOSIS — R5383 Other fatigue: Secondary | ICD-10-CM | POA: Diagnosis not present

## 2017-07-24 DIAGNOSIS — Z789 Other specified health status: Secondary | ICD-10-CM

## 2017-07-24 DIAGNOSIS — B882 Other arthropod infestations: Secondary | ICD-10-CM | POA: Diagnosis not present

## 2017-07-24 MED ORDER — DOXYCYCLINE HYCLATE 100 MG PO CAPS: ORAL_CAPSULE | ORAL | 0 refills | Status: DC

## 2017-07-24 MED ORDER — HYDROCORTISONE VALERATE 0.2 % EX OINT: TOPICAL_OINTMENT | CUTANEOUS | 2 refills | Status: DC

## 2017-07-24 NOTE — Patient Instructions (Signed)
Stop your trimethaprim/sulfamethoxazole (antibiotic I gave you last visit).  Start doxycycline (new antibiotic) tomorrow.

## 2017-07-24 NOTE — Progress Notes (Signed)
OFFICE VISIT  07/24/2017   CC:  Chief Complaint  Patient presents with  . Medication Reaction    ? concerned that the Bactrim is making him feel bad     HPI:    Patient is a 67 y.o. Caucasian male with DM 2, HTN, and HLD who presents for question of medication reaction. I saw him 10d ago and dx'd him with acute sinusitis, started bactrim ds bid (to try to cover this AND possible prostatitis that he was feeling the beginning sx's of at that time).  Feeling lightheaded, particularly upon standing up, subjective f/c last night, no appetite--all this started when he started taking bactrim 10d/a, has gradually worsened, peaked last night so he decided to come in today to get checked.  Some nausea yesterday, no vomiting.  No diarrhea.  Minimal cough.  Diffuse fatigue/malaise.   He has continued to take the bactrim every night.  Some joint pains, mild.  Describes entire body feeling "numb" but no focal weakness or paresthesias.  No ST or neck stiffness.  No rash.  +HA in occipital region, dull. No slurred speech, no trouble swallowing, no vision or hearing complaints with this.   No recollection of any tick bites.  States sinus sx's are unchanged.  No glucose checks during this period of time while feeling bad, but his DM has been very well controlled. BP checks have been normal.   Past Medical History:  Diagnosis Date  . Basal cell carcinoma (BCC) of right temple region 03/2017   Dr. Denna Haggard  . Cataract 07/2015   R eye--surgery recommended by his eye MD 08/03/15  . Diabetes mellitus, type 2 ([place]) Dx'd 08/2013  . Erectile dysfunction    Dr. Gaynelle Arabian.  Testosterone [facility] 04/2016 at [facility].  . Gout of big toe 1990s   only one episode; he cut back on peanut butter and hasn't had any prob since  . History of prostatitis 1980s   Remote hx of seeing Dr. Terance Hart at [facility] urology--then Dr. Gaynelle Arabian.   Marland Kitchen Hyperlipidemia   . Hypertension dx'd 08/2013  . Kidney stones 1980s  . Microcytic  anemia 06/2016  . Thalassemia    Mild anemia with MCV 60s (alpha thal minor or beta thal trait: Hb electrophoresis not done/no result available.).    Past Surgical History:  Procedure Laterality Date  . NASAL POLYP EXCISION  1960    Outpatient Medications Prior to Visit  Medication Sig Dispense Refill  . amLODipine (NORVASC) 10 MG tablet Take 1 tablet (10 mg total) by mouth daily. 90 tablet 1  . aspirin EC 81 MG tablet Take 1 tablet (81 mg total) by mouth daily. 30 tablet 0  . atorvastatin (LIPITOR) 20 MG tablet Take 1 tablet (20 mg total) by mouth daily. 90 tablet 1  . cetirizine (ZYRTEC) 10 MG tablet Take 10 mg by mouth daily.    Marland Kitchen glucose blood test strip Use as instructed 100 each 6  . hydrochlorothiazide (MICROZIDE) 12.5 MG capsule Take 1 capsule (12.5 mg total) by mouth daily. 90 capsule 1  . irbesartan (AVAPRO) 300 MG tablet Take 1 tablet (300 mg total) by mouth daily. 90 tablet 1  . Lancets (FREESTYLE) lancets Use as instructed 100 each 12  . metFORMIN (GLUCOPHAGE) 1000 MG tablet Take 1 tablet (1,000 mg total) by mouth 2 (two) times daily with a meal. 180 tablet 1  . pioglitazone (ACTOS) 30 MG tablet Take 1 tablet (30 mg total) by mouth daily. 90 tablet 1  . hydrocortisone valerate  ointment (WEST-CORT) 0.2 % Appy bid to affected area prn (Patient not taking: Reported on 07/24/2017) 15 g 2  . sulfamethoxazole-trimethoprim (BACTRIM DS,SEPTRA DS) 800-160 MG tablet Take 1 tablet by mouth 2 (two) times daily. (Patient not taking: Reported on 07/24/2017) 28 tablet 0   No facility-administered medications prior to visit.     No Known Allergies  ROS As per HPI  PE: Blood pressure 135/76, pulse 75, temperature 98.2 F (36.8 C), temperature source Oral, resp. rate 16, height 6' 1.5" (1.867 m), weight 250 lb 8 oz (113.6 kg), SpO2 97 %. Gen: Alert, well appearing.  Patient is oriented to person, place, time, and situation. ENT: Ears: EACs clear, normal epithelium.  TMs with good light  reflex and landmarks bilaterally.  Eyes: no injection, icteris, swelling, or exudate.  EOMI, PERRLA. Nose: no drainage or turbinate edema/swelling.  No injection or focal lesion.  Mouth: lips without lesion/swelling.  Oral mucosa pink and moist.  Dentition intact and without obvious caries or gingival swelling.  Oropharynx without erythema, exudate, or swelling.  Neck - No masses or thyromegaly or limitation in range of motion CV: RRR, soft systolic murmur, no diastolic murmur, no rub/gallop. SKIN: no rash. EXT: no c/c/e.   LABS:    Chemistry      Component Value Date/Time   NA 140 07/14/2017 0817   K 4.3 07/14/2017 0817   CL 105 07/14/2017 0817   CO2 24 07/14/2017 0817   BUN 21 07/14/2017 0817   CREATININE 1.01 07/14/2017 0817      Component Value Date/Time   CALCIUM 9.7 07/14/2017 0817   ALKPHOS 48 07/08/2016 0846   AST 14 07/14/2017 0817   ALT 15 07/14/2017 0817   BILITOT 1.1 07/14/2017 0817     Lab Results  Component Value Date   HGBA1C 6.7 (H) 07/14/2017    IMPRESSION AND PLAN:  Nonspecific malaise s/p starting/taking bactrim.  Question med allergy/intolerance. Sx's also c/w possible tick borne illness. Sinusitis sx's persist unchanged per pt. Last dose of bactrim was last night.   Plan: No med tomorrow. Start doxycycline day after tomorrw to treat for possible tick borne illness and hopefully this will be helpful for pt's sinusitis sx's as well. Check CBC w/diff,CMET, TSH, CRP today.  An After Visit Summary was printed and given to the patient.  FOLLOW UP: Return in about 5 days (around 07/29/2017) for fatigue/med intol/?tick illness.  Signed:  Crissie Sickles, MD           07/24/2017

## 2017-07-25 LAB — COMPREHENSIVE METABOLIC PANEL
AG Ratio: 1.7 (calc) (ref 1.0–2.5)
ALBUMIN MSPROF: 4.5 g/dL (ref 3.6–5.1)
ALKALINE PHOSPHATASE (APISO): 54 U/L (ref 40–115)
ALT: 18 U/L (ref 9–46)
AST: 21 U/L (ref 10–35)
BUN / CREAT RATIO: 17 (calc) (ref 6–22)
BUN: 35 mg/dL — AB (ref 7–25)
CHLORIDE: 105 mmol/L (ref 98–110)
CO2: 21 mmol/L (ref 20–32)
CREATININE: 2.12 mg/dL — AB (ref 0.70–1.25)
Calcium: 9.6 mg/dL (ref 8.6–10.3)
GLOBULIN: 2.7 g/dL (ref 1.9–3.7)
Glucose, Bld: 116 mg/dL — ABNORMAL HIGH (ref 65–99)
POTASSIUM: 4.6 mmol/L (ref 3.5–5.3)
SODIUM: 137 mmol/L (ref 135–146)
TOTAL PROTEIN: 7.2 g/dL (ref 6.1–8.1)
Total Bilirubin: 0.7 mg/dL (ref 0.2–1.2)

## 2017-07-25 LAB — CBC WITH DIFFERENTIAL/PLATELET
BASOS PCT: 0.6 %
Basophils Absolute: 32 cells/uL (ref 0–200)
EOS ABS: 69 {cells}/uL (ref 15–500)
Eosinophils Relative: 1.3 %
HCT: 35.1 % — ABNORMAL LOW (ref 38.5–50.0)
HEMOGLOBIN: 10.7 g/dL — AB (ref 13.2–17.1)
Lymphs Abs: 1283 cells/uL (ref 850–3900)
MCH: 18.9 pg — AB (ref 27.0–33.0)
MCHC: 30.5 g/dL — ABNORMAL LOW (ref 32.0–36.0)
MCV: 62 fL — ABNORMAL LOW (ref 80.0–100.0)
MONOS PCT: 8.9 %
NEUTROS ABS: 3445 {cells}/uL (ref 1500–7800)
Neutrophils Relative %: 65 %
PLATELETS: 291 10*3/uL (ref 140–400)
RBC: 5.66 10*6/uL (ref 4.20–5.80)
RDW: 18.2 % — ABNORMAL HIGH (ref 11.0–15.0)
TOTAL LYMPHOCYTE: 24.2 %
WBC mixed population: 472 cells/uL (ref 200–950)
WBC: 5.3 10*3/uL (ref 3.8–10.8)

## 2017-07-25 LAB — TSH: TSH: 0.84 m[IU]/L (ref 0.40–4.50)

## 2017-07-25 LAB — C-REACTIVE PROTEIN: CRP: 0.6 mg/L (ref <8.0)

## 2017-07-29 ENCOUNTER — Encounter: Payer: Self-pay | Admitting: Family Medicine

## 2017-07-29 ENCOUNTER — Ambulatory Visit (INDEPENDENT_AMBULATORY_CARE_PROVIDER_SITE_OTHER): Payer: Medicare HMO | Admitting: Family Medicine

## 2017-07-29 VITALS — BP 131/76 | HR 70 | Temp 98.2°F | Resp 16 | Ht 73.5 in | Wt 250.2 lb

## 2017-07-29 DIAGNOSIS — J0100 Acute maxillary sinusitis, unspecified: Secondary | ICD-10-CM

## 2017-07-29 DIAGNOSIS — N179 Acute kidney failure, unspecified: Secondary | ICD-10-CM

## 2017-07-29 DIAGNOSIS — T50905D Adverse effect of unspecified drugs, medicaments and biological substances, subsequent encounter: Secondary | ICD-10-CM | POA: Diagnosis not present

## 2017-07-29 NOTE — Addendum Note (Signed)
Addended by: Gordy Councilman on: 07/29/2017 02:34 PM   Modules accepted: Orders

## 2017-07-29 NOTE — Progress Notes (Signed)
OFFICE VISIT  07/29/2017   CC:  Chief Complaint  Patient presents with  . Follow-up    fatigue/med intol/?tick illness    HPI:    Patient is a 67 y.o. Caucasian male who presents for 5 d f/u significant generalized fatigue and malaise that started right after starting on bactrim and continued to worsen throughout his 10d course of this med. Last visit we had him go 1 day w/out abx, then start doxy the next day to cover for tick borne illness and hopefully help sinusitis sx's as well. Labs were normal except he had a significant rise in his sCR to 2.1-up from 1.1, and a stable mild microcytic anemia from alpha thall vs beta thal trait. Yesterday I told the patient to hold his irbesartan, hctz, and his metformin.  Feels a whole lot better--started feeling better after stopping bactrim. Fatigue resolving, appetite is up.  No fevers.  BPs and glucoses still good. No n/v/d or achiness.  No rash. He admits he does not hydrate well on a chronic basis. No trouble with urinary obstructive sx's. Says "sinus" sx's improved, says he has some "allergy sx's" but nothing too bothersome.  Past Medical History:  Diagnosis Date  . Basal cell carcinoma (BCC) of right temple region 03/2017   Dr. Denna Haggard  . Cataract 07/2015   R eye--surgery recommended by his eye MD 08/03/15  . Diabetes mellitus, type 2 ([place]) Dx'd 08/2013  . Erectile dysfunction    Dr. Gaynelle Arabian.  Testosterone normal 04/2016 [facility].  . Gout of big toe 1990s   only one episode; he cut back on peanut butter and hasn't had any prob since  . History of prostatitis 1980s   Remote hx of seeing Dr. Terance Hart at [facility] urology--then Dr. Gaynelle Arabian.   Marland Kitchen Hyperlipidemia   . Hypertension dx'd 08/2013  . Kidney stones 1980s  . Microcytic anemia 06/2016  . Thalassemia    Mild anemia with MCV 60s (alpha thal minor or beta thal trait: Hb electrophoresis not done/no result available.).    Past Surgical History:  Procedure Laterality Date  .  NASAL POLYP EXCISION  1960    Outpatient Medications Prior to Visit  Medication Sig Dispense Refill  . amLODipine (NORVASC) 10 MG tablet Take 1 tablet (10 mg total) by mouth daily. 90 tablet 1  . aspirin EC 81 MG tablet Take 1 tablet (81 mg total) by mouth daily. 30 tablet 0  . atorvastatin (LIPITOR) 20 MG tablet Take 1 tablet (20 mg total) by mouth daily. 90 tablet 1  . cetirizine (ZYRTEC) 10 MG tablet Take 10 mg by mouth daily.    Marland Kitchen doxycycline (VIBRAMYCIN) 100 MG capsule 1 tab po bid x 10d 20 capsule 0  . hydrocortisone valerate ointment (WEST-CORT) 0.2 % Appy bid to affected area prn 15 g 2  . Lancets (FREESTYLE) lancets Use as instructed 100 each 12  . pioglitazone (ACTOS) 30 MG tablet Take 1 tablet (30 mg total) by mouth daily. 90 tablet 1  . glucose blood test strip Use as instructed 100 each 6  . hydrochlorothiazide (MICROZIDE) 12.5 MG capsule Take 1 capsule (12.5 mg total) by mouth daily. (Patient not taking: Reported on 07/29/2017) 90 capsule 1  . irbesartan (AVAPRO) 300 MG tablet Take 1 tablet (300 mg total) by mouth daily. (Patient not taking: Reported on 07/29/2017) 90 tablet 1  . metFORMIN (GLUCOPHAGE) 1000 MG tablet Take 1 tablet (1,000 mg total) by mouth 2 (two) times daily with a meal. (Patient not taking:  Reported on 07/29/2017) 180 tablet 1   No facility-administered medications prior to visit.     No Known Allergies  ROS As per HPI  PE: Blood pressure 131/76, pulse 70, temperature 98.2 F (36.8 C), temperature source Oral, resp. rate 16, height 6' 1.5" (1.867 m), weight 250 lb 4 oz (113.5 kg), SpO2 98 %. Gen: Alert, well appearing.  Patient is oriented to person, place, time, and situation. AFFECT: pleasant, lucid thought and speech. TGG:YIRS: no injection, icteris, swelling, or exudate.  EOMI, PERRLA. Mouth: lips without lesion/swelling.  Oral mucosa pink and moist. Oropharynx without erythema, exudate, or swelling.  CV: RRR, no m/r/g.   LUNGS: CTA bilat,  nonlabored resps, good aeration in all lung fields. EXT: no clubbing or cyanosis.  Trace bilat pitting edema in LL's. Skin: no rash  LABS:   Lab Results  Component Value Date   TSH 0.84 07/24/2017   Lab Results  Component Value Date   WBC 5.3 07/24/2017   HGB 10.7 (L) 07/24/2017   HCT 35.1 (L) 07/24/2017   MCV 62.0 (L) 07/24/2017   PLT 291 07/24/2017   Lab Results  Component Value Date   CREATININE 2.12 (H) 07/24/2017   BUN 35 (H) 07/24/2017   NA 137 07/24/2017   K 4.6 07/24/2017   CL 105 07/24/2017   CO2 21 07/24/2017   Lab Results  Component Value Date   ALT 18 07/24/2017   AST 21 07/24/2017   ALKPHOS 48 07/08/2016   BILITOT 0.7 07/24/2017   Lab Results  Component Value Date   CHOL 120 07/14/2017   Lab Results  Component Value Date   HDL 38 (L) 07/14/2017   Lab Results  Component Value Date   LDLCALC 59 07/14/2017   Lab Results  Component Value Date   TRIG 145 07/14/2017   Lab Results  Component Value Date   CHOLHDL 3.2 07/14/2017   Lab Results  Component Value Date   PSA 0.73 01/16/2017   PSA 0.85 05/09/2015   Lab Results  Component Value Date   HGBA1C 6.7 (H) 07/14/2017   Lab Results  Component Value Date   CRP 0.6 07/24/2017    IMPRESSION AND PLAN:  1) Allergy/intolerance to bactrim: caused significant acute fatigue/malaise. Suspect his sCr elevation is from relative dehydration. Recheck CMET today, check UA with reflex microscopy as well. Continue to hold hctz, irbesartan, and metformin. Finish doxy for possibility of tick born illness. Acute sinusitis seems resolved---back to his baseline allerg rhin sx's.  An After Visit Summary was printed and given to the patient.  FOLLOW UP: Return if symptoms worsen or fail to improve.  Signed:  Crissie Sickles, MD           07/29/2017

## 2017-07-30 ENCOUNTER — Other Ambulatory Visit (INDEPENDENT_AMBULATORY_CARE_PROVIDER_SITE_OTHER): Payer: Medicare HMO

## 2017-07-30 ENCOUNTER — Encounter: Payer: Self-pay | Admitting: *Deleted

## 2017-07-30 DIAGNOSIS — T50905D Adverse effect of unspecified drugs, medicaments and biological substances, subsequent encounter: Secondary | ICD-10-CM

## 2017-07-30 DIAGNOSIS — N179 Acute kidney failure, unspecified: Secondary | ICD-10-CM | POA: Diagnosis not present

## 2017-07-30 LAB — URINALYSIS, ROUTINE W REFLEX MICROSCOPIC
Bilirubin Urine: NEGATIVE
Hgb urine dipstick: NEGATIVE
KETONES UR: NEGATIVE
LEUKOCYTES UA: NEGATIVE
NITRITE: NEGATIVE
SPECIFIC GRAVITY, URINE: 1.02 (ref 1.000–1.030)
TOTAL PROTEIN, URINE-UPE24: NEGATIVE
Urine Glucose: NEGATIVE
Urobilinogen, UA: 0.2 (ref 0.0–1.0)
pH: 5.5 (ref 5.0–8.0)

## 2017-09-03 DIAGNOSIS — D229 Melanocytic nevi, unspecified: Secondary | ICD-10-CM | POA: Diagnosis not present

## 2017-09-03 DIAGNOSIS — L820 Inflamed seborrheic keratosis: Secondary | ICD-10-CM | POA: Diagnosis not present

## 2017-09-03 DIAGNOSIS — D485 Neoplasm of uncertain behavior of skin: Secondary | ICD-10-CM | POA: Diagnosis not present

## 2017-10-07 ENCOUNTER — Other Ambulatory Visit: Payer: Self-pay | Admitting: Family Medicine

## 2017-10-07 NOTE — Telephone Encounter (Signed)
Question, per last office visit note (07/29/17) pt was to hold hctz, irbesartan and metformin. Does pt still need to hold these medications? Please advise. Thanks.

## 2017-10-07 NOTE — Telephone Encounter (Signed)
Yes, he is ok to be back on these meds.  I went ahead and authorized all RFs.

## 2017-10-13 DIAGNOSIS — M5431 Sciatica, right side: Secondary | ICD-10-CM | POA: Diagnosis not present

## 2017-10-13 DIAGNOSIS — M9903 Segmental and somatic dysfunction of lumbar region: Secondary | ICD-10-CM | POA: Diagnosis not present

## 2017-10-14 DIAGNOSIS — M5431 Sciatica, right side: Secondary | ICD-10-CM | POA: Diagnosis not present

## 2017-10-14 DIAGNOSIS — M9903 Segmental and somatic dysfunction of lumbar region: Secondary | ICD-10-CM | POA: Diagnosis not present

## 2017-10-20 ENCOUNTER — Ambulatory Visit (INDEPENDENT_AMBULATORY_CARE_PROVIDER_SITE_OTHER): Payer: Medicare HMO | Admitting: Family Medicine

## 2017-10-20 ENCOUNTER — Encounter: Payer: Self-pay | Admitting: Family Medicine

## 2017-10-20 VITALS — BP 152/83 | HR 77 | Temp 98.1°F | Resp 16 | Ht 73.5 in | Wt 251.2 lb

## 2017-10-20 DIAGNOSIS — E7800 Pure hypercholesterolemia, unspecified: Secondary | ICD-10-CM | POA: Diagnosis not present

## 2017-10-20 DIAGNOSIS — E669 Obesity, unspecified: Secondary | ICD-10-CM

## 2017-10-20 DIAGNOSIS — I10 Essential (primary) hypertension: Secondary | ICD-10-CM | POA: Diagnosis not present

## 2017-10-20 DIAGNOSIS — E119 Type 2 diabetes mellitus without complications: Secondary | ICD-10-CM

## 2017-10-20 DIAGNOSIS — N182 Chronic kidney disease, stage 2 (mild): Secondary | ICD-10-CM | POA: Diagnosis not present

## 2017-10-20 NOTE — Progress Notes (Signed)
OFFICE VISIT  10/20/2017   CC:  Chief Complaint  Patient presents with  . Follow-up    RCI, pt is fasting.    HPI:    Patient is a 67 y.o. Caucasian male who presents for f/u DM2, HTN, HLD.  Pt feeling fine except some recent L sided chest wall/musculoskeletal pain, went to chiropracter and he manipulated this and rib felt worse for a while, but starting to feel better last 1-2d.  HTN: home bp 135/78 the other day.  Other numbers " a little high" but pt could not be specific. I held his irbesartan and hctz about 2 mo ago in the midst of an acute illness in which his sCr went up.  DM: gluc's 140-160 while off the med recently when sick.  He noted it got up to 150-180 so he gradually restarted his metformin and is now back on 1000 mg bid.  HLD: tolerating statin well.  He is trying to eat low fat/chol foods as well as lower carb diet. No exercise regimen but he is not sedentary.  ROS: no exertional CP,  no SOB, no wheezing, no cough, no dizziness, no HAs, no rashes, no melena/hematochezia.  No polyuria or polydipsia.  No myalgias or arthralgias.    Past Medical History:  Diagnosis Date  . Basal cell carcinoma (BCC) of right temple region 03/2017   Dr. Denna Haggard  . Cataract 07/2015   R eye--surgery recommended by his eye MD 08/03/15  . Diabetes mellitus, type 2 ([place]) Dx'd 08/2013  . Erectile dysfunction    Dr. Gaynelle Arabian.  Testosterone normal 04/2016 at [facility]  . Gout of big toe 1990s   only one episode; he cut back on peanut butter and hasn't had any prob since  . History of prostatitis 1980s   Remote hx of seeing Dr. Terance Hart at [facility] urology--then Dr. Gaynelle Arabian.   Marland Kitchen Hyperlipidemia   . Hypertension dx'd 08/2013  . Kidney stones 1980s  . Microcytic anemia 06/2016  . Thalassemia    Mild anemia with MCV 60s (alpha thal minor or beta thal trait: Hb electrophoresis not done/no result available.).    Past Surgical History:  Procedure Laterality Date  . NASAL POLYP EXCISION   1960    Outpatient Medications Prior to Visit  Medication Sig Dispense Refill  . amLODipine (NORVASC) 10 MG tablet TAKE 1 TABLET DAILY 90 tablet 1  . aspirin EC 81 MG tablet Take 1 tablet (81 mg total) by mouth daily. 30 tablet 0  . atorvastatin (LIPITOR) 20 MG tablet TAKE 1 TABLET DAILY 90 tablet 1  . cetirizine (ZYRTEC) 10 MG tablet Take 10 mg by mouth daily.    Marland Kitchen glucose blood test strip Use as instructed 100 each 6  . hydrocortisone valerate ointment (WEST-CORT) 0.2 % Appy bid to affected area prn 15 g 2  . Lancets (FREESTYLE) lancets Use as instructed 100 each 12  . metFORMIN (GLUCOPHAGE) 1000 MG tablet TAKE 1 TABLET TWICE DAILY  WITH MEALS 180 tablet 1  . pioglitazone (ACTOS) 30 MG tablet TAKE 1 TABLET DAILY 90 tablet 1  . doxycycline (VIBRAMYCIN) 100 MG capsule 1 tab po bid x 10d (Patient not taking: Reported on 10/20/2017) 20 capsule 0  . hydrochlorothiazide (MICROZIDE) 12.5 MG capsule TAKE 1 CAPSULE DAILY (Patient not taking: Reported on 10/20/2017) 90 capsule 1  . irbesartan (AVAPRO) 300 MG tablet TAKE 1 TABLET DAILY (Patient not taking: Reported on 10/20/2017) 90 tablet 1   No facility-administered medications prior to visit.  No Known Allergies  ROS As per HPI  PE: Blood pressure (!) 152/83, pulse 77, temperature 98.1 F (36.7 C), temperature source Oral, resp. rate 16, height 6' 1.5" (1.867 m), weight 251 lb 4 oz (114 kg), SpO2 97 %. Gen: Alert, well appearing.  Patient is oriented to person, place, time, and situation. AFFECT: pleasant, lucid thought and speech. CV: RRR, no m/r/g.   LUNGS: CTA bilat, nonlabored resps, good aeration in all lung fields. EXT: no clubbing or cyanosis.  2+ pitting edema bilat.  LABS:  Lab Results  Component Value Date   TSH 0.84 07/24/2017   Lab Results  Component Value Date   WBC 5.3 07/24/2017   HGB 10.7 (L) 07/24/2017   HCT 35.1 (L) 07/24/2017   MCV 62.0 (L) 07/24/2017   PLT 291 07/24/2017   Lab Results  Component Value  Date   IRON 187 (H) 07/09/2016   FERRITIN 260.1 07/09/2016    Lab Results  Component Value Date   CREATININE 1.28 (H) 07/29/2017   BUN 24 07/29/2017   NA 138 07/29/2017   K 4.1 07/29/2017   CL 105 07/29/2017   CO2 24 07/29/2017  GFR 07/29/17= 57 ml/min  Lab Results  Component Value Date   ALT 20 07/29/2017   AST 23 07/29/2017   ALKPHOS 48 07/08/2016   BILITOT 0.9 07/29/2017   Lab Results  Component Value Date   CHOL 120 07/14/2017   Lab Results  Component Value Date   HDL 38 (L) 07/14/2017   Lab Results  Component Value Date   LDLCALC 59 07/14/2017   Lab Results  Component Value Date   TRIG 145 07/14/2017   Lab Results  Component Value Date   CHOLHDL 3.2 07/14/2017   Lab Results  Component Value Date   PSA 0.73 01/16/2017   PSA 0.85 05/09/2015    Lab Results  Component Value Date   HGBA1C 6.7 (H) 07/14/2017    IMPRESSION AND PLAN:  1) DM 2: glucoses up some compared to his normal good control-->was off metformin for a while due to acute illness/sCr rise.  Now back on it and glucoses coming down. HbA1c today.  2) HTN: bp's back up some since being off both his hctz and his irbesartan.  Continue amlodipine 10mg  qd. If sCr stable, will restart hctz 12.5mg  qd first.  3) HLD: tolerating statin.  FLP 3 mo ago excellent, pt tolerating statin.  4) CRI; usual GFR 60s.  Acute sCr drop during acute illness about 6-8 wks ago. Recheck BMET today.  An After Visit Summary was printed and given to the patient.  FOLLOW UP: Return in about 3 months (around 01/19/2018) for annual CPE (fasting).  Signed:  Crissie Sickles, MD           10/20/2017

## 2017-10-21 LAB — BASIC METABOLIC PANEL
BUN: 16 mg/dL (ref 7–25)
CO2: 24 mmol/L (ref 20–32)
Calcium: 9.2 mg/dL (ref 8.6–10.3)
Chloride: 107 mmol/L (ref 98–110)
Creat: 0.94 mg/dL (ref 0.70–1.25)
Glucose, Bld: 147 mg/dL — ABNORMAL HIGH (ref 65–99)
POTASSIUM: 4.2 mmol/L (ref 3.5–5.3)
Sodium: 140 mmol/L (ref 135–146)

## 2017-10-21 LAB — HEMOGLOBIN A1C
HEMOGLOBIN A1C: 6.8 %{Hb} — AB (ref <5.7)
Mean Plasma Glucose: 148 (calc)
eAG (mmol/L): 8.2 (calc)

## 2017-10-27 DIAGNOSIS — M5431 Sciatica, right side: Secondary | ICD-10-CM | POA: Diagnosis not present

## 2017-10-27 DIAGNOSIS — M9903 Segmental and somatic dysfunction of lumbar region: Secondary | ICD-10-CM | POA: Diagnosis not present

## 2017-11-10 DIAGNOSIS — M9903 Segmental and somatic dysfunction of lumbar region: Secondary | ICD-10-CM | POA: Diagnosis not present

## 2017-11-10 DIAGNOSIS — M5431 Sciatica, right side: Secondary | ICD-10-CM | POA: Diagnosis not present

## 2017-11-10 LAB — COMPLETE METABOLIC PANEL WITH GFR
AG RATIO: 1.6 (calc) (ref 1.0–2.5)
ALT: 20 U/L (ref 9–46)
AST: 23 U/L (ref 10–35)
Albumin: 4.5 g/dL (ref 3.6–5.1)
Alkaline phosphatase (APISO): 57 U/L (ref 40–115)
BUN/Creatinine Ratio: 19 (calc) (ref 6–22)
BUN: 24 mg/dL (ref 7–25)
CALCIUM: 9.2 mg/dL (ref 8.6–10.3)
CO2: 24 mmol/L (ref 20–32)
CREATININE: 1.28 mg/dL — AB (ref 0.70–1.25)
Chloride: 105 mmol/L (ref 98–110)
GFR, Est African American: 66 mL/min/{1.73_m2} (ref >=60)
GFR, Est Non African American: 57 mL/min/{1.73_m2} — ABNORMAL LOW (ref >=60)
GLOBULIN: 2.8 g/dL (ref 1.9–3.7)
Glucose, Bld: 107 mg/dL — ABNORMAL HIGH (ref 65–99)
POTASSIUM: 4.1 mmol/L (ref 3.5–5.3)
SODIUM: 138 mmol/L (ref 135–146)
Total Bilirubin: 0.9 mg/dL (ref 0.2–1.2)
Total Protein: 7.3 g/dL (ref 6.1–8.1)

## 2017-11-24 DIAGNOSIS — M9903 Segmental and somatic dysfunction of lumbar region: Secondary | ICD-10-CM | POA: Diagnosis not present

## 2017-11-24 DIAGNOSIS — R69 Illness, unspecified: Secondary | ICD-10-CM | POA: Diagnosis not present

## 2017-11-24 DIAGNOSIS — M5431 Sciatica, right side: Secondary | ICD-10-CM | POA: Diagnosis not present

## 2017-12-15 ENCOUNTER — Ambulatory Visit (INDEPENDENT_AMBULATORY_CARE_PROVIDER_SITE_OTHER): Payer: Medicare HMO | Admitting: Family Medicine

## 2017-12-15 ENCOUNTER — Encounter: Payer: Self-pay | Admitting: Family Medicine

## 2017-12-15 VITALS — BP 139/87 | HR 92 | Temp 97.9°F | Resp 20 | Ht 73.5 in | Wt 248.6 lb

## 2017-12-15 DIAGNOSIS — M109 Gout, unspecified: Secondary | ICD-10-CM

## 2017-12-15 DIAGNOSIS — M79674 Pain in right toe(s): Secondary | ICD-10-CM | POA: Diagnosis not present

## 2017-12-15 MED ORDER — NAPROXEN 500 MG PO TABS: 500.0000 mg | ORAL_TABLET | Freq: Two times a day (BID) | ORAL | 0 refills | Status: DC

## 2017-12-15 MED ORDER — METHYLPREDNISOLONE ACETATE 80 MG/ML IJ SUSP
80.0000 mg | Freq: Once | INTRAMUSCULAR | Status: AC
  Administered 2017-12-15: 80 mg via INTRAMUSCULAR

## 2017-12-15 NOTE — Progress Notes (Signed)
Zachary Chandler , [birth year], 67 y.o., male MRN: [id number] Patient Care Team    Relationship Specialty Notifications Start End  McGowen, Adrian Blackwater, MD PCP - General Family Medicine  02/28/13    Comment: Jim Like, Lerry Paterson, MD Consulting Physician Ophthalmology  08/12/15   Carolan Clines, MD Consulting Physician Urology  05/12/16     Chief Complaint  Patient presents with  . Toe Pain    right great toe x 10 days     Subjective: Pt presents for an OV with complaints of right great toe pain  of 10 days duration.  Associated symptoms include redness and swelling. He denies any changes to his diet, fever, chills or injury. He states initially it swelled up to about double its size today. He saw soe improvement with soaking, fluids and ibuprofen at home.  H/o gout many years ago (> 20 years) same location. He is a diabetic controlled (A1c 6.8). Pt has tried soaking and ibuprofen  to ease their symptoms.   Depression screen [facility] 2/9 10/20/2017 07/14/2017 07/08/2016 09/07/2015 05/10/2014  Decreased Interest 0 1 0 0 0  Down, Depressed, Hopeless 0 0 0 0 0  PHQ - 2 Score 0 1 0 0 0  Altered sleeping 1 0 - - -  Tired, decreased energy 1 2 - - -  Change in appetite 2 2 - - -  Feeling bad or failure about yourself  0 0 - - -  Trouble concentrating 0 0 - - -  Moving slowly or fidgety/restless 0 0 - - -  Suicidal thoughts 0 0 - - -  PHQ-9 Score 4 5 - - -  Difficult doing work/chores Not difficult at all Not difficult at all - - -    No Known Allergies Social History   Tobacco Use  . Smoking status: Former Smoker    Last attempt to quit: 02/28/1970    Years since quitting: 47.8  . Smokeless tobacco: Never Used  Substance Use Topics  . Alcohol use: No    Alcohol/week: 0.0 standard drinks   Past Medical History:  Diagnosis Date  . Basal cell carcinoma (BCC) of right temple region 03/2017   Dr. Denna Haggard  . Cataract 07/2015   R eye--surgery recommended by his eye MD 08/03/15  .  Diabetes mellitus, type 2 ([place]) Dx'd 08/2013  . Erectile dysfunction    Dr. Gaynelle Arabian.  Testosterone normal 04/2016 at [facility].  . Gout of big toe 1990s   only one episode; he cut back on peanut butter and hasn't had any prob since  . History of prostatitis 1980s   Remote hx of seeing Dr. Terance Hart at [facility] urology--then Dr. Gaynelle Arabian.   Marland Kitchen Hyperlipidemia   . Hypertension dx'd 08/2013  . Kidney stones 1980s  . Microcytic anemia 06/2016  . Thalassemia    Mild anemia with MCV 60s (alpha thal minor or beta thal trait: Hb electrophoresis not done/no result available.).   Past Surgical History:  Procedure Laterality Date  . NASAL POLYP EXCISION  1960   Family History  Problem Relation Age of Onset  . Heart disease Mother   . Hypertension Mother   . Diabetes Mother   . Heart disease Father   . Heart attack Sister    Allergies as of 12/15/2017   No Known Allergies     Medication List        Accurate as of 12/15/17 10:46 AM. Always use your most recent med list.  amLODipine 10 MG tablet Commonly known as:  NORVASC TAKE 1 TABLET DAILY   aspirin EC 81 MG tablet Take 1 tablet (81 mg total) by mouth daily.   atorvastatin 20 MG tablet Commonly known as:  LIPITOR TAKE 1 TABLET DAILY   cetirizine 10 MG tablet Commonly known as:  ZYRTEC Take 10 mg by mouth daily.   freestyle lancets Use as instructed   glucose blood test strip Use as instructed   hydrocortisone valerate ointment 0.2 % Commonly known as:  WEST-CORT Appy bid to affected area prn   metFORMIN 1000 MG tablet Commonly known as:  GLUCOPHAGE TAKE 1 TABLET TWICE DAILY  WITH MEALS   pioglitazone 30 MG tablet Commonly known as:  ACTOS TAKE 1 TABLET DAILY       All past medical history, surgical history, allergies, family history, immunizations andmedications were updated in the EMR today and reviewed under the history and medication portions of their EMR.     ROS: Negative, with the exception of  above mentioned in HPI   Objective:  BP 139/87 (BP Location: Left Arm, Patient Position: Sitting, Cuff Size: Large)   Pulse 92   Temp 97.9 F (36.6 C)   Resp 20   Ht 6' 1.5" (1.867 m)   Wt 248 lb 9.6 oz (112.8 kg)   SpO2 97%   BMI 32.35 kg/m  Body mass index is 32.35 kg/m. Gen: Afebrile. No acute distress. Nontoxic in appearance, well developed, well nourished.  HENT: AT. [place].  MMM Eyes:Pupils Equal Round Reactive to light, Extraocular movements intact,  Conjunctiva without redness, discharge or icterus. MSK: erythema, mild swelling, tenderness right medial 1st distal metatarsal head. Tender ROM. NV intact distally.  Neuro:  Normal gait. PERLA. EOMi. Alert. Oriented x3  No exam data present No results found. No results found for this or any previous visit (from the past 24 hour(s)).  Assessment/Plan: KEVONTE VANECEK is a 67 y.o. male present for OV for  Pain of right great toe/Acute gout involving toe of right foot, unspecified cause Consistent with gout. Trying to avoid long steroid taper with diabetes. IM depo medrol today and then naproxen BID - methylPREDNISolone acetate (DEPO-MEDROL) injection 80 mg - naproxen BID with food for 5-7 days. - increase water consumption. Hydrate. Flush kidneys.  - low purine diet provided - F/U PCP 1 -2 weeks if not resolved .   Reviewed expectations re: course of current medical issues.  Discussed self-management of symptoms.  Outlined signs and symptoms indicating need for more acute intervention.  Patient verbalized understanding and all questions were answered.  Patient received an After-Visit Summary.    No orders of the defined types were placed in this encounter.    Note is dictated utilizing voice recognition software. Although note has been proof read prior to signing, occasional typographical errors still can be missed. If any questions arise, please do not hesitate to call for verification.   electronically signed  by:  Howard Pouch, DO  [place]

## 2017-12-15 NOTE — Patient Instructions (Addendum)
Steroid shot today.  Start naproxen every 12 hours for 5-7 days (should be resolved by then)- you will have some left over.  Follow up in 1-2 weeks with your PCP if not resolved.    Gout Gout is painful swelling that can happen in some of your joints. Gout is a type of arthritis. This condition is caused by having too much uric acid in your body. Uric acid is a chemical that is made when your body breaks down substances called purines. If your body has too much uric acid, sharp crystals can form and build up in your joints. This causes pain and swelling. Gout attacks can happen quickly and be very painful (acute gout). Over time, the attacks can affect more joints and happen more often (chronic gout). Follow these instructions at home: During a Gout Attack  If directed, put ice on the painful area: ? Put ice in a plastic bag. ? Place a towel between your skin and the bag. ? Leave the ice on for 20 minutes, 2-3 times a day.  Rest the joint as much as possible. If the joint is in your leg, you may be given crutches to use.  Raise (elevate) the painful joint above the level of your heart as often as you can.  Drink enough fluids to keep your pee (urine) clear or pale yellow.  Take over-the-counter and prescription medicines only as told by your doctor.  Do not drive or use heavy machinery while taking prescription pain medicine.  Follow instructions from your doctor about what you can or cannot eat and drink.  Return to your normal activities as told by your doctor. Ask your doctor what activities are safe for you. Avoiding Future Gout Attacks  Follow a low-purine diet as told by a specialist (dietitian) or your doctor. Avoid foods and drinks that have a lot of purines, such as: ? Liver. ? Kidney. ? Anchovies. ? Asparagus. ? Herring. ? Mushrooms ? Mussels. ? Beer.  Limit alcohol intake to no more than 1 drink a day for nonpregnant women and 2 drinks a day for men. One drink  equals 12 oz of beer, 5 oz of wine, or 1 oz of hard liquor.  Stay at a healthy weight or lose weight if you are overweight. If you want to lose weight, talk with your doctor. It is important that you do not lose weight too fast.  Start or continue an exercise plan as told by your doctor.  Drink enough fluids to keep your pee clear or pale yellow.  Take over-the-counter and prescription medicines only as told by your doctor.  Keep all follow-up visits as told by your doctor. This is important. Contact a doctor if:  You have another gout attack.  You still have symptoms of a gout attack after10 days of treatment.  You have problems (side effects) because of your medicines.  You have chills or a fever.  You have burning pain when you pee (urinate).  You have pain in your lower back or belly. Get help right away if:  You have very bad pain.  Your pain cannot be controlled.  You cannot pee. This information is not intended to replace advice given to you by your health care provider. Make sure you discuss any questions you have with your health care provider. Document Released: 11/06/2007 Document Revised: 07/05/2015 Document Reviewed: 11/09/2014 Elsevier Interactive Patient Education  2018 [place] Diet Purines are compounds that affect the level of  uric acid in your body. A low-purine diet is a diet that is low in purines. Eating a low-purine diet can prevent the level of uric acid in your body from getting too high and causing gout or kidney stones or both. What do I need to know about this diet?  Choose low-purine foods. Examples of low-purine foods are listed in the next section.  Drink plenty of fluids, especially water. Fluids can help remove uric acid from your body. Try to drink 8-16 cups (1.9-3.8 L) a day.  Limit foods high in fat, especially saturated fat, as fat makes it harder for the body to get rid of uric acid. Foods high in saturated fat  include pizza, cheese, ice cream, whole milk, fried foods, and gravies. Choose foods that are lower in fat and lean sources of protein. Use olive oil when cooking as it contains healthy fats that are not high in saturated fat.  Limit alcohol. Alcohol interferes with the elimination of uric acid from your body. If you are having a gout attack, avoid all alcohol.  Keep in mind that different people's bodies react differently to different foods. You will probably learn over time which foods do or do not affect you. If you discover that a food tends to cause your gout to flare up, avoid eating that food. You can more freely enjoy foods that do not cause problems. If you have any questions about a food item, talk to your dietitian or health care provider. Which foods are low, moderate, and high in purines? The following is a list of foods that are low, moderate, and high in purines. You can eat any amount of the foods that are low in purines. You may be able to have small amounts of foods that are moderate in purines. Ask your health care provider how much of a food moderate in purines you can have. Avoid foods high in purines. Grains  Foods low in purines: Enriched white bread, pasta, rice, cake, cornbread, popcorn.  Foods moderate in purines: Whole-grain breads and cereals, wheat germ, bran, oatmeal. Uncooked oatmeal. Dry wheat bran or wheat germ.  Foods high in purines: Pancakes, [place] toast, biscuits, muffins. Vegetables  Foods low in purines: All vegetables, except those that are moderate in purines.  Foods moderate in purines: Asparagus, cauliflower, spinach, mushrooms, green peas. Fruits  All fruits are low in purines. Meats and other Protein Foods  Foods low in purines: Eggs, nuts, peanut butter.  Foods moderate in purines: 80-90% lean beef, lamb, veal, pork, poultry, fish, eggs, peanut butter, nuts. Crab, lobster, oysters, and shrimp. Cooked dried beans, peas, and lentils.  Foods  high in purines: Anchovies, sardines, herring, mussels, tuna, codfish, scallops, trout, and haddock. Berniece Salines. Organ meats (such as liver or kidney). Tripe. Game meat. Goose. Sweetbreads. Dairy  All dairy foods are low in purines. Low-fat and fat-free dairy products are best because they are low in saturated fat. Beverages  Drinks low in purines: Water, carbonated beverages, tea, coffee, cocoa.  Drinks moderate in purines: Soft drinks and other drinks sweetened with high-fructose corn syrup. Juices. To find whether a food or drink is sweetened with high-fructose corn syrup, look at the ingredients list.  Drinks high in purines: Alcoholic beverages (such as beer). Condiments  Foods low in purines: Salt, herbs, olives, pickles, relishes, vinegar.  Foods moderate in purines: Butter, margarine, oils, mayonnaise. Fats and Oils  Foods low in purines: All types, except gravies and sauces made with meat.  Foods high in  purines: Gravies and sauces made with meat. Other Foods  Foods low in purines: Sugars, sweets, gelatin. Cake. Soups made without meat.  Foods moderate in purines: Meat-based or fish-based soups, broths, or bouillons. Foods and drinks sweetened with high-fructose corn syrup.  Foods high in purines: High-fat desserts (such as ice cream, cookies, cakes, pies, doughnuts, and chocolate). Contact your dietitian for more information on foods that are not listed here. This information is not intended to replace advice given to you by your health care provider. Make sure you discuss any questions you have with your health care provider. Document Released: 05/24/2010 Document Revised: 07/05/2015 Document Reviewed: 01/03/2013 Elsevier Interactive Patient Education  2017 [employer].

## 2018-01-05 DIAGNOSIS — R69 Illness, unspecified: Secondary | ICD-10-CM | POA: Diagnosis not present

## 2018-02-01 ENCOUNTER — Ambulatory Visit (INDEPENDENT_AMBULATORY_CARE_PROVIDER_SITE_OTHER): Payer: Medicare HMO | Admitting: Family Medicine

## 2018-02-01 ENCOUNTER — Encounter: Payer: Self-pay | Admitting: Family Medicine

## 2018-02-01 VITALS — BP 141/81 | HR 77 | Temp 98.1°F | Resp 16 | Ht 73.5 in | Wt 247.1 lb

## 2018-02-01 DIAGNOSIS — K5909 Other constipation: Secondary | ICD-10-CM

## 2018-02-01 DIAGNOSIS — Z0000 Encounter for general adult medical examination without abnormal findings: Secondary | ICD-10-CM | POA: Diagnosis not present

## 2018-02-01 DIAGNOSIS — Z1211 Encounter for screening for malignant neoplasm of colon: Secondary | ICD-10-CM

## 2018-02-01 DIAGNOSIS — E119 Type 2 diabetes mellitus without complications: Secondary | ICD-10-CM

## 2018-02-01 DIAGNOSIS — Z125 Encounter for screening for malignant neoplasm of prostate: Secondary | ICD-10-CM

## 2018-02-01 DIAGNOSIS — E669 Obesity, unspecified: Secondary | ICD-10-CM

## 2018-02-01 LAB — PSA, MEDICARE: PSA: 0.86 ng/ml (ref 0.10–4.00)

## 2018-02-01 LAB — MICROALBUMIN / CREATININE URINE RATIO
Creatinine,U: 134.4 mg/dL
Microalb Creat Ratio: 5 mg/g (ref 0.0–30.0)
Microalb, Ur: 6.7 mg/dL — ABNORMAL HIGH (ref 0.0–1.9)

## 2018-02-01 NOTE — Patient Instructions (Signed)
For constipation:  Buy generic over the counter senakot-S and take 2 tabs every evening. Also, buy generic over the counter miralax powder and take 1 capful in 8 ounces of water 1 -2 times a day AS NEEDED.    Health Maintenance, Male A healthy lifestyle and preventive care is important for your health and wellness. Ask your health care provider about what schedule of regular examinations is right for you. What should I know about weight and diet? Eat a Healthy Diet  Eat plenty of vegetables, fruits, whole grains, low-fat dairy products, and lean protein.  Do not eat a lot of foods high in solid fats, added sugars, or salt.  Maintain a Healthy Weight Regular exercise can help you achieve or maintain a healthy weight. You should:  Do at least 150 minutes of exercise each week. The exercise should increase your heart rate and make you sweat (moderate-intensity exercise).  Do strength-training exercises at least twice a week. Watch Your Levels of Cholesterol and Blood Lipids  Have your blood tested for lipids and cholesterol every 5 years starting at 69 years of age. If you are at high risk for heart disease, you should start having your blood tested when you are 69 years old. You may need to have your cholesterol levels checked more often if: ? Your lipid or cholesterol levels are high. ? You are older than 69 years of age. ? You are at high risk for heart disease. What should I know about cancer screening? Many types of cancers can be detected early and may often be prevented. Lung Cancer  You should be screened every year for lung cancer if: ? You are a current smoker who has smoked for at least 30 years. ? You are a former smoker who has quit within the past 15 years.  Talk to your health care provider about your screening options, when you should start screening, and how often you should be screened. Colorectal Cancer  Routine colorectal cancer screening usually begins at 69  years of age and should be repeated every 5-10 years until you are 69 years old. You may need to be screened more often if early forms of precancerous polyps or small growths are found. Your health care provider may recommend screening at an earlier age if you have risk factors for colon cancer.  Your health care provider may recommend using home test kits to check for hidden blood in the stool.  A small camera at the end of a tube can be used to examine your colon (sigmoidoscopy or colonoscopy). This checks for the earliest forms of colorectal cancer. Prostate and Testicular Cancer  Depending on your age and overall health, your health care provider may do certain tests to screen for prostate and testicular cancer.  Talk to your health care provider about any symptoms or concerns you have about testicular or prostate cancer. Skin Cancer  Check your skin from head to toe regularly.  Tell your health care provider about any new moles or changes in moles, especially if: ? There is a change in a mole's size, shape, or color. ? You have a mole that is larger than a pencil eraser.  Always use sunscreen. Apply sunscreen liberally and repeat throughout the day.  Protect yourself by wearing long sleeves, pants, a wide-brimmed hat, and sunglasses when outside. What should I know about heart disease, diabetes, and high blood pressure?  If you are 48-62 years of age, have your blood pressure checked every 3-5  years. If you are 35 years of age or older, have your blood pressure checked every year. You should have your blood pressure measured twice-once when you are at a hospital or clinic, and once when you are not at a hospital or clinic. Record the average of the two measurements. To check your blood pressure when you are not at a hospital or clinic, you can use: ? An automated blood pressure machine at a pharmacy. ? A home blood pressure monitor.  Talk to your health care provider about your target  blood pressure.  If you are between 15-47 years old, ask your health care provider if you should take aspirin to prevent heart disease.  Have regular diabetes screenings by checking your fasting blood sugar level. ? If you are at a normal weight and have a low risk for diabetes, have this test once every three years after the age of 36. ? If you are overweight and have a high risk for diabetes, consider being tested at a younger age or more often.  A one-time screening for abdominal aortic aneurysm (AAA) by ultrasound is recommended for men aged 87-75 years who are current or former smokers. What should I know about preventing infection? Hepatitis B If you have a higher risk for hepatitis B, you should be screened for this virus. Talk with your health care provider to find out if you are at risk for hepatitis B infection. Hepatitis C Blood testing is recommended for:  Everyone born from [birth year] through 1967.  Anyone with known risk factors for hepatitis C. Sexually Transmitted Diseases (STDs)  You should be screened each year for STDs including gonorrhea and chlamydia if: ? You are sexually active and are younger than 69 years of age. ? You are older than 69 years of age and your health care provider tells you that you are at risk for this type of infection. ? Your sexual activity has changed since you were last screened and you are at an increased risk for chlamydia or gonorrhea. Ask your health care provider if you are at risk.  Talk with your health care provider about whether you are at high risk of being infected with HIV. Your health care provider may recommend a prescription medicine to help prevent HIV infection. What else can I do?  Schedule regular health, dental, and eye exams.  Stay current with your vaccines (immunizations).  Do not use any tobacco products, such as cigarettes, chewing tobacco, and e-cigarettes. If you need help quitting, ask your health care  provider.  Limit alcohol intake to no more than 2 drinks per day. One drink equals 12 ounces of beer, 5 ounces of wine, or 1 ounces of hard liquor.  Do not use street drugs.  Do not share needles.  Ask your health care provider for help if you need support or information about quitting drugs.  Tell your health care provider if you often feel depressed.  Tell your health care provider if you have ever been abused or do not feel safe at home. This information is not intended to replace advice given to you by your health care provider. Make sure you discuss any questions you have with your health care provider. Document Released: 07/26/2007 Document Revised: 09/26/2015 Document Reviewed: 10/31/2014 Elsevier Interactive Patient Education  2019 [employer].

## 2018-02-01 NOTE — Progress Notes (Signed)
Office Note 02/01/2018  CC:  Chief Complaint  Patient presents with  . Annual Exam    Pt is fasting.     HPI:  Zachary Chandler is a 67 y.o. White male who is here for annual health maintenance exam.  No exercise. No specific dieting strategies except trying to eat low carbs.  Has developed chronic constipation: infrequent BMs, usually hard little balls.  OTC dulcolax tabs no help.  No hematochezia, melena, rectal pain, or abd pain.   Past Medical History:  Diagnosis Date  . Basal cell carcinoma (BCC) of right temple region 03/2017   Dr. Denna Haggard  . Cataract 07/2015   R eye--surgery recommended by his eye MD 08/03/15  . Diabetes mellitus, type 2 ([place]) Dx'd 08/2013  . Erectile dysfunction    Dr. Gaynelle Arabian.  Testosterone normal 04/2016 at [facility].  . Gout of big toe 1990s   only one episode; he cut back on peanut butter and hasn't had any prob since  . History of prostatitis 1980s   Remote hx of seeing Dr. Terance Hart at [facility] urology--then Dr. Gaynelle Arabian.   Marland Kitchen Hyperlipidemia   . Hypertension dx'd 08/2013  . Kidney stones 1980s  . Microcytic anemia 06/2016  . Thalassemia    Mild anemia with MCV 60s (alpha thal minor or beta thal trait: Hb electrophoresis not done/no result available.).    Past Surgical History:  Procedure Laterality Date  . NASAL POLYP EXCISION  1960    Family History  Problem Relation Age of Onset  . Heart disease Mother   . Hypertension Mother   . Diabetes Mother   . Heart disease Father   . Heart attack Sister     Social History   Socioeconomic History  . Marital status: Married    Spouse name: Not on file  . Number of children: Not on file  . Years of education: Not on file  . Highest education level: Not on file  Occupational History  . Not on file  Social Needs  . Financial resource strain: Not on file  . Food insecurity:    Worry: Not on file    Inability: Not on file  . Transportation needs:    Medical: Not on file   Non-medical: Not on file  Tobacco Use  . Smoking status: Former Smoker    Last attempt to quit: 02/28/1970    Years since quitting: 47.9  . Smokeless tobacco: Never Used  Substance and Sexual Activity  . Alcohol use: No    Alcohol/week: 0.0 standard drinks  . Drug use: No  . Sexual activity: Not on file  Lifestyle  . Physical activity:    Days per week: Not on file    Minutes per session: Not on file  . Stress: Not on file  Relationships  . Social connections:    Talks on phone: Not on file    Gets together: Not on file    Attends religious service: Not on file    Active member of club or organization: Not on file    Attends meetings of clubs or organizations: Not on file    Relationship status: Not on file  . Intimate partner violence:    Fear of current or ex partner: Not on file    Emotionally abused: Not on file    Physically abused: Not on file    Forced sexual activity: Not on file  Other Topics Concern  . Not on file  Social History Narrative   Married, has  one 69 y/o son.   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     Orig from [place].   Was in [employer] [phone].  No combat duty.  No VA care.   Smoked 10-12 yrs, quit in his early 30s.   Alcohol: none   No hx of drugs.             Outpatient Medications Prior to Visit  Medication Sig Dispense Refill  . amLODipine (NORVASC) 10 MG tablet TAKE 1 TABLET DAILY 90 tablet 1  . aspirin EC 81 MG tablet Take 1 tablet (81 mg total) by mouth daily. 30 tablet 0  . atorvastatin (LIPITOR) 20 MG tablet TAKE 1 TABLET DAILY 90 tablet 1  . cetirizine (ZYRTEC) 10 MG tablet Take 10 mg by mouth daily.    Marland Kitchen glucose blood test strip Use as instructed 100 each 6  . hydrocortisone valerate ointment (WEST-CORT) 0.2 % Appy bid to affected area prn 15 g 2  . Lancets (FREESTYLE) lancets Use as instructed 100 each 12  . metFORMIN (GLUCOPHAGE) 1000 MG tablet TAKE 1 TABLET TWICE DAILY  WITH MEALS 180 tablet 1  . naproxen  (NAPROSYN) 500 MG tablet Take 1 tablet (500 mg total) by mouth 2 (two) times daily with a meal. 30 tablet 0  . pioglitazone (ACTOS) 30 MG tablet TAKE 1 TABLET DAILY 90 tablet 1   No facility-administered medications prior to visit.     No Known Allergies  ROS Review of Systems  Constitutional: Negative for appetite change, chills, fatigue and fever.  HENT: Negative for congestion, dental problem, ear pain and sore throat.   Eyes: Negative for discharge, redness and visual disturbance.  Respiratory: Negative for cough, chest tightness, shortness of breath and wheezing.   Cardiovascular: Negative for chest pain, palpitations and leg swelling.  Gastrointestinal: Positive for constipation. Negative for abdominal pain, blood in stool, diarrhea, nausea and vomiting.  Genitourinary: Negative for difficulty urinating, dysuria, flank pain, frequency, hematuria and urgency.  Musculoskeletal: Negative for arthralgias, back pain, joint swelling, myalgias and neck stiffness.  Skin: Negative for pallor and rash.  Neurological: Negative for dizziness, speech difficulty, weakness and headaches.  Hematological: Negative for adenopathy. Does not bruise/bleed easily.  Psychiatric/Behavioral: Negative for confusion and sleep disturbance. The patient is not nervous/anxious.     PE; Blood pressure (!) 141/81, pulse 77, temperature 98.1 F (36.7 C), temperature source Oral, resp. rate 16, height 6' 1.5" (1.867 m), weight 247 lb 2 oz (112.1 kg), SpO2 97 %. Gen: Alert, well appearing.  Patient is oriented to person, place, time, and situation. AFFECT: pleasant, lucid thought and speech. ENT: Ears: EACs clear, normal epithelium.  TMs with good light reflex and landmarks bilaterally.  Eyes: no injection, icteris, swelling, or exudate.  EOMI, PERRLA. Nose: no drainage or turbinate edema/swelling.  No injection or focal lesion.  Mouth: lips without lesion/swelling.  Oral mucosa pink and moist.  Dentition intact and  without obvious caries or gingival swelling.  Oropharynx without erythema, exudate, or swelling.  Neck: supple/nontender.  No LAD, mass, or TM.  Carotid pulses 2+ bilaterally, without bruits. CV: RRR, no m/r/g.   LUNGS: CTA bilat, nonlabored resps, good aeration in all lung fields. ABD: soft, NT, ND, BS normal.  No hepatospenomegaly or mass.  No bruits. EXT: no clubbing, cyanosis, or edema.  Musculoskeletal: no joint swelling, erythema, warmth, or tenderness.  ROM of all joints intact. Skin - no sores or suspicious lesions or rashes or color changes Rectal exam:  negative without mass, lesions or tenderness, PROSTATE EXAM: smooth and symmetric without nodules or tenderness. Foot exam - bilateral normal; no swelling, tenderness or skin or vascular lesions. Color and temperature is normal. Sensation is intact. Peripheral pulses are palpable. Toenails are normal.    Pertinent labs:  Lab Results  Component Value Date   TSH 0.84 07/24/2017   Lab Results  Component Value Date   WBC 5.3 07/24/2017   HGB 10.7 (L) 07/24/2017   HCT 35.1 (L) 07/24/2017   MCV 62.0 (L) 07/24/2017   PLT 291 07/24/2017   Lab Results  Component Value Date   CREATININE 0.94 10/20/2017   BUN 16 10/20/2017   NA 140 10/20/2017   K 4.2 10/20/2017   CL 107 10/20/2017   CO2 24 10/20/2017   Lab Results  Component Value Date   ALT 20 07/29/2017   AST 23 07/29/2017   ALKPHOS 48 07/08/2016   BILITOT 0.9 07/29/2017   Lab Results  Component Value Date   CHOL 120 07/14/2017   Lab Results  Component Value Date   HDL 38 (L) 07/14/2017   Lab Results  Component Value Date   LDLCALC 59 07/14/2017   Lab Results  Component Value Date   TRIG 145 07/14/2017   Lab Results  Component Value Date   CHOLHDL 3.2 07/14/2017   Lab Results  Component Value Date   PSA 0.73 01/16/2017   PSA 0.85 05/09/2015   Lab Results  Component Value Date   HGBA1C 6.8 (H) 10/20/2017    ASSESSMENT AND PLAN:   1)  Constipation:  Instructions.  Stop dulcolax.   Buy generic over the counter senakot-S and take 2 tabs every evening. Also, buy generic over the counter miralax powder and take 1 capful in 8 ounces of water 1 -2 times a day AS NEEDED.  2) Health maintenance exam: Reviewed age and gender appropriate health maintenance issues (prudent diet, regular exercise, health risks of tobacco and excessive alcohol, use of seatbelts, fire alarms in home, use of sunscreen).  Also reviewed age and gender appropriate health screening as well as vaccine recommendations. Vaccines: all UTD. Labs: CBC, CMET, FLP, A1c. Prostate ca screening: DRE normal today, PSA. Colon ca screening: pt declines colonoscopy.  iFOB today.  3) DM: feet exam normal.  Urine microalb/cr today.  An After Visit Summary was printed and given to the patient.  FOLLOW UP:  Return in about 3 months (around 05/03/2018) for routine chronic illness f/u.  Signed:  Crissie Sickles, MD           02/01/2018

## 2018-02-02 LAB — COMPREHENSIVE METABOLIC PANEL
AG Ratio: 1.7 (calc) (ref 1.0–2.5)
ALKALINE PHOSPHATASE (APISO): 67 U/L (ref 40–115)
ALT: 16 U/L (ref 9–46)
AST: 21 U/L (ref 10–35)
Albumin: 4.7 g/dL (ref 3.6–5.1)
BUN: 19 mg/dL (ref 7–25)
CALCIUM: 9.6 mg/dL (ref 8.6–10.3)
CO2: 22 mmol/L (ref 20–32)
Chloride: 104 mmol/L (ref 98–110)
Creat: 1.05 mg/dL (ref 0.70–1.25)
Globulin: 2.8 g/dL (calc) (ref 1.9–3.7)
Glucose, Bld: 182 mg/dL — ABNORMAL HIGH (ref 65–99)
Potassium: 4 mmol/L (ref 3.5–5.3)
Sodium: 142 mmol/L (ref 135–146)
Total Bilirubin: 1.1 mg/dL (ref 0.2–1.2)
Total Protein: 7.5 g/dL (ref 6.1–8.1)

## 2018-02-02 LAB — CBC WITH DIFFERENTIAL/PLATELET
Absolute Monocytes: 814 cells/uL (ref 200–950)
BASOS ABS: 71 {cells}/uL (ref 0–200)
Basophils Relative: 0.9 %
EOS ABS: 111 {cells}/uL (ref 15–500)
EOS PCT: 1.4 %
HCT: 37 % — ABNORMAL LOW (ref 38.5–50.0)
HEMOGLOBIN: 11.6 g/dL — AB (ref 13.2–17.1)
Lymphs Abs: 1390 cells/uL (ref 850–3900)
MCH: 19.8 pg — AB (ref 27.0–33.0)
MCHC: 31.4 g/dL — ABNORMAL LOW (ref 32.0–36.0)
MCV: 63.1 fL — ABNORMAL LOW (ref 80.0–100.0)
Monocytes Relative: 10.3 %
NEUTROS ABS: 5514 {cells}/uL (ref 1500–7800)
Neutrophils Relative %: 69.8 %
Platelets: 302 10*3/uL (ref 140–400)
RBC: 5.86 10*6/uL — ABNORMAL HIGH (ref 4.20–5.80)
RDW: 18 % — ABNORMAL HIGH (ref 11.0–15.0)
Total Lymphocyte: 17.6 %
WBC: 7.9 10*3/uL (ref 3.8–10.8)

## 2018-02-02 LAB — LIPID PANEL
Cholesterol: 116 mg/dL (ref <200)
HDL: 42 mg/dL (ref >40)
LDL Cholesterol (Calc): 56 mg/dL (calc)
Non-HDL Cholesterol (Calc): 74 mg/dL (calc) (ref <130)
Total CHOL/HDL Ratio: 2.8 (calc) (ref <5.0)
Triglycerides: 94 mg/dL (ref <150)

## 2018-02-02 LAB — HEMOGLOBIN A1C
EAG (MMOL/L): 8.4 (calc)
HEMOGLOBIN A1C: 6.9 %{Hb} — AB (ref <5.7)
Mean Plasma Glucose: 151 (calc)

## 2018-02-02 LAB — CBC MORPHOLOGY

## 2018-02-15 ENCOUNTER — Encounter: Payer: Self-pay | Admitting: Family Medicine

## 2018-02-15 ENCOUNTER — Other Ambulatory Visit (INDEPENDENT_AMBULATORY_CARE_PROVIDER_SITE_OTHER): Payer: Medicare HMO

## 2018-02-15 DIAGNOSIS — Z1211 Encounter for screening for malignant neoplasm of colon: Secondary | ICD-10-CM | POA: Diagnosis not present

## 2018-02-15 HISTORY — DX: Encounter for screening for malignant neoplasm of colon: Z12.11

## 2018-02-15 LAB — FECAL OCCULT BLOOD, IMMUNOCHEMICAL: Fecal Occult Bld: NEGATIVE

## 2018-02-16 ENCOUNTER — Encounter: Payer: Self-pay | Admitting: *Deleted

## 2018-04-03 ENCOUNTER — Other Ambulatory Visit: Payer: Self-pay | Admitting: Family Medicine

## 2018-04-05 NOTE — Telephone Encounter (Signed)
I went ahead and authorized RF hctz--looks like I just had him off this med temporarily.-thx!

## 2018-04-05 NOTE — Telephone Encounter (Signed)
RF request received for HCTZ, it looks like this RX has been d/c'd.  Can you please check behind me?    Thanks!

## 2018-04-28 DIAGNOSIS — R69 Illness, unspecified: Secondary | ICD-10-CM | POA: Diagnosis not present

## 2018-05-03 ENCOUNTER — Encounter: Payer: Self-pay | Admitting: Family Medicine

## 2018-05-03 ENCOUNTER — Ambulatory Visit (INDEPENDENT_AMBULATORY_CARE_PROVIDER_SITE_OTHER): Payer: Medicare HMO | Admitting: Family Medicine

## 2018-05-03 ENCOUNTER — Other Ambulatory Visit: Payer: Self-pay

## 2018-05-03 VITALS — BP 143/83 | HR 85 | Temp 97.9°F | Resp 16 | Ht 73.5 in | Wt 249.2 lb

## 2018-05-03 DIAGNOSIS — E119 Type 2 diabetes mellitus without complications: Secondary | ICD-10-CM | POA: Diagnosis not present

## 2018-05-03 DIAGNOSIS — I10 Essential (primary) hypertension: Secondary | ICD-10-CM

## 2018-05-03 DIAGNOSIS — E7800 Pure hypercholesterolemia, unspecified: Secondary | ICD-10-CM | POA: Diagnosis not present

## 2018-05-03 LAB — POCT GLYCOSYLATED HEMOGLOBIN (HGB A1C): Hemoglobin A1C: 7.3 % — AB (ref 4.0–5.6)

## 2018-05-03 MED ORDER — PIOGLITAZONE HCL 45 MG PO TABS: 45.0000 mg | ORAL_TABLET | Freq: Every day | ORAL | 1 refills | Status: DC

## 2018-05-03 NOTE — Progress Notes (Signed)
OFFICE VISIT  05/03/2018   CC:  Chief Complaint  Patient presents with  . Follow-up    RCI, pt is fasting    HPI:    Patient is a 68 y.o. Caucasian male who presents for 3 mo f/u DM, HTN, HLD. No acute complaints. Tired of the self quarantine mandate by the CDC/gov--"I'm not even sick!"  DM: gluc's avg 160 Diabetic diet fair.  No exercise.  HTN: usually 130s/70s.  HLD: taking statin w/out problem.  ROS: no CP, no SOB, no wheezing, no cough, no dizziness, no HAs, no rashes, no melena/hematochezia.  No polyuria or polydipsia.  No myalgias or arthralgias.   Past Medical History:  Diagnosis Date  . Basal cell carcinoma (BCC) of right temple region 03/2017   Dr. Denna Haggard  . Cataract 07/2015   R eye--surgery recommended by his eye MD 08/03/15  . Colon cancer screening 02/15/2018   iFOB neg.  Repeat after 02/2019.  . Diabetes mellitus, type 2 ([place]) Dx'd 08/2013  . Erectile dysfunction    Dr. Gaynelle Arabian.  Testosterone normal 04/2016 at [facility].  . Gout of big toe 1990s   only one episode; he cut back on peanut butter and hasn't had any prob since  . History of prostatitis 1980s   Remote hx of seeing Dr. Terance Hart at [facility] urology--then Dr. Gaynelle Arabian.   Marland Kitchen Hyperlipidemia   . Hypertension dx'd 08/2013  . Kidney stones 1980s  . Microcytic anemia 06/2016  . Thalassemia    Mild anemia with MCV 60s (alpha thal minor or beta thal trait: Hb electrophoresis not done/no result available.).    Past Surgical History:  Procedure Laterality Date  . NASAL POLYP EXCISION  1960    Outpatient Medications Prior to Visit  Medication Sig Dispense Refill  . amLODipine (NORVASC) 10 MG tablet TAKE 1 TABLET DAILY 90 tablet 1  . aspirin EC 81 MG tablet Take 1 tablet (81 mg total) by mouth daily. 30 tablet 0  . atorvastatin (LIPITOR) 20 MG tablet TAKE 1 TABLET DAILY 90 tablet 1  . cetirizine (ZYRTEC) 10 MG tablet Take 10 mg by mouth daily.    Marland Kitchen glucose blood test strip Use as instructed 100 each 6   . hydrochlorothiazide (MICROZIDE) 12.5 MG capsule TAKE 1 CAPSULE DAILY 90 capsule 1  . hydrocortisone valerate ointment (WEST-CORT) 0.2 % Appy bid to affected area prn 15 g 2  . Lancets (FREESTYLE) lancets Use as instructed 100 each 12  . metFORMIN (GLUCOPHAGE) 1000 MG tablet TAKE 1 TABLET TWICE DAILY  WITH MEALS 180 tablet 1  . naproxen (NAPROSYN) 500 MG tablet Take 1 tablet (500 mg total) by mouth 2 (two) times daily with a meal. 30 tablet 0  . pioglitazone (ACTOS) 30 MG tablet TAKE 1 TABLET DAILY 90 tablet 1   No facility-administered medications prior to visit.     No Known Allergies  ROS As per HPI  PE: Blood pressure (!) 143/83, pulse 85, temperature 97.9 F (36.6 C), temperature source Oral, resp. rate 16, height 6' 1.5" (1.867 m), weight 249 lb 3.2 oz (113 kg), SpO2 97 %. Body mass index is 32.43 kg/m.  Gen: Alert, well appearing.  Patient is oriented to person, place, time, and situation. AFFECT: pleasant, lucid thought and speech. CV: RRR, no m/r/g.   LUNGS: CTA bilat, nonlabored resps, good aeration in all lung fields. EXT: no clubbing or cyanosis.  no edema.    LABS:  Lab Results  Component Value Date   TSH 0.84 07/24/2017  Lab Results  Component Value Date   WBC 7.9 02/01/2018   HGB 11.6 (L) 02/01/2018   HCT 37.0 (L) 02/01/2018   MCV 63.1 (L) 02/01/2018   PLT 302 02/01/2018   Lab Results  Component Value Date   CREATININE 1.05 02/01/2018   BUN 19 02/01/2018   NA 142 02/01/2018   K 4.0 02/01/2018   CL 104 02/01/2018   CO2 22 02/01/2018   Lab Results  Component Value Date   ALT 16 02/01/2018   AST 21 02/01/2018   ALKPHOS 48 07/08/2016   BILITOT 1.1 02/01/2018   Lab Results  Component Value Date   CHOL 116 02/01/2018   Lab Results  Component Value Date   HDL 42 02/01/2018   Lab Results  Component Value Date   LDLCALC 56 02/01/2018   Lab Results  Component Value Date   TRIG 94 02/01/2018   Lab Results  Component Value Date    CHOLHDL 2.8 02/01/2018   Lab Results  Component Value Date   PSA 0.86 02/01/2018   PSA 0.73 01/16/2017   PSA 0.85 05/09/2015   Lab Results  Component Value Date   HGBA1C 7.3 (A) 05/03/2018   POC HbA1c=7.3%  IMPRESSION AND PLAN:  1) DM 2: control has been pretty good. POC HbA1c today=7.3%, a little up from 3 mo ago. Increase pioglitazone to 45 mg qd, get stricter on diet.  2) HTN: The current medical regimen is effective;  continue present plan and medications. Lytes/cr stable 3 mo ago.  Repeat 3 mo.  3) HLD: tolerating statin. Work on diet.  AST/ALT and FLP excellent 3 mo ago. Repeat 3 mo.  An After Visit Summary was printed and given to the patient.  FOLLOW UP: Return in about 3 months (around 08/03/2018) for routine chronic illness f/u.  Signed:  Crissie Sickles, MD           05/03/2018

## 2018-08-09 ENCOUNTER — Ambulatory Visit (INDEPENDENT_AMBULATORY_CARE_PROVIDER_SITE_OTHER): Payer: Medicare HMO | Admitting: Family Medicine

## 2018-08-09 ENCOUNTER — Ambulatory Visit: Payer: Medicare HMO | Admitting: Family Medicine

## 2018-08-09 ENCOUNTER — Encounter: Payer: Self-pay | Admitting: Family Medicine

## 2018-08-09 ENCOUNTER — Other Ambulatory Visit: Payer: Self-pay

## 2018-08-09 VITALS — BP 140/82 | HR 69 | Temp 98.1°F | Resp 16 | Ht 73.5 in | Wt 250.8 lb

## 2018-08-09 DIAGNOSIS — I10 Essential (primary) hypertension: Secondary | ICD-10-CM

## 2018-08-09 DIAGNOSIS — E119 Type 2 diabetes mellitus without complications: Secondary | ICD-10-CM | POA: Diagnosis not present

## 2018-08-09 DIAGNOSIS — E7800 Pure hypercholesterolemia, unspecified: Secondary | ICD-10-CM

## 2018-08-09 MED ORDER — HYDROCHLOROTHIAZIDE 25 MG PO TABS: 25.0000 mg | ORAL_TABLET | Freq: Every day | ORAL | 3 refills | Status: DC

## 2018-08-09 NOTE — Progress Notes (Signed)
Office Note 08/09/2018  CC:  Chief Complaint  Patient presents with  . Follow-up    RCI, pt is fasting    HPI:  Zachary Chandler is a 68 y.o. White male who is here for 3 mo f/u DM 2, HTN, HLD.  DM 2: glucoses around 140-150.  HTN: bp checks at home 140/70-80.  HLD: tolerating atorvastatin well.  ROS: no CP, no SOB, no wheezing, no cough, no dizziness, no HAs, no rashes, no melena/hematochezia.  No polyuria or polydipsia.  No myalgias or arthralgias. Some nasal cong w/PND, sneezing --"my usual".  Some recent  Back/hip pain getting better gradually.   Past Medical History:  Diagnosis Date  . Basal cell carcinoma (BCC) of right temple region 03/2017   Dr. Denna Haggard  . Cataract 07/2015   R eye--surgery recommended by his eye MD 08/03/15  . Colon cancer screening 02/15/2018   iFOB neg.  Repeat after 02/2019.  . Diabetes mellitus, type 2 ([place]) Dx'd 08/2013  . Erectile dysfunction    Dr. Gaynelle Arabian.  Testosterone normal 04/2016 at [facility].  . Gout of big toe 1990s   only one episode; he cut back on peanut butter and hasn't had any prob since  . History of prostatitis 1980s   Remote hx of seeing Dr. Terance Hart at [facility] u[facility]--then Dr. Gaynelle Arabian.   Marland Kitchen Hyperlipidemia   . Hypertension dx'd 08/2013  . Kidney stones 1980s  . Microcytic anemia 06/2016  . Thalassemia    Mild anemia with MCV 60s (alpha thal minor or beta thal trait: Hb electrophoresis not done/no result available.).    Past Surgical History:  Procedure Laterality Date  . NASAL POLYP EXCISION  1960    Family History  Problem Relation Age of Onset  . Heart disease Mother   . Hypertension Mother   . Diabetes Mother   . Heart disease Father   . Heart attack Sister     Social History   Socioeconomic History  . Marital status: Married    Spouse name: Not on file  . Number of children: Not on file  . Years of education: Not on file  . Highest education level: Not on file  Occupational History  . Not on file   Social Needs  . Financial resource strain: Not on file  . Food insecurity    Worry: Not on file    Inability: Not on file  . Transportation needs    Medical: Not on file    Non-medical: Not on file  Tobacco Use  . Smoking status: Former Smoker    Quit date: 02/28/1970    Years since quitting: 48.4  . Smokeless tobacco: Never Used  Substance and Sexual Activity  . Alcohol use: No    Alcohol/week: 0.0 standard drinks  . Drug use: No  . Sexual activity: Not on file  Lifestyle  . Physical activity    Days per week: Not on file    Minutes per session: Not on file  . Stress: Not on file  Relationships  . Social Herbalist on phone: Not on file    Gets together: Not on file    Attends religious service: Not on file    Active member of club or organization: Not on file    Attends meetings of clubs or organizations: Not on file    Relationship status: Not on file  . Intimate partner violence    Fear of current or ex partner: Not on file  Emotionally abused: Not on file    Physically abused: Not on file    Forced sexual activity: Not on file  Other Topics Concern  . Not on file  Social History Narrative   Married, has one 80 y/o son.   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     Orig from [place].   Was in [employer] [phone].  No combat duty.  No VA care.   Smoked 10-12 yrs, quit in his early 30s.   Alcohol: none   No hx of drugs.             Outpatient Medications Prior to Visit  Medication Sig Dispense Refill  . amLODipine (NORVASC) 10 MG tablet TAKE 1 TABLET DAILY 90 tablet 1  . aspirin EC 81 MG tablet Take 1 tablet (81 mg total) by mouth daily. 30 tablet 0  . atorvastatin (LIPITOR) 20 MG tablet TAKE 1 TABLET DAILY 90 tablet 1  . cetirizine (ZYRTEC) 10 MG tablet Take 10 mg by mouth daily.    Marland Kitchen glucose blood test strip Use as instructed 100 each 6  . hydrocortisone valerate ointment (WEST-CORT) 0.2 % Appy bid to affected area prn 15 g 2   . Lancets (FREESTYLE) lancets Use as instructed 100 each 12  . metFORMIN (GLUCOPHAGE) 1000 MG tablet TAKE 1 TABLET TWICE DAILY  WITH MEALS 180 tablet 1  . pioglitazone (ACTOS) 45 MG tablet Take 1 tablet (45 mg total) by mouth daily. 90 tablet 1  . hydrochlorothiazide (MICROZIDE) 12.5 MG capsule TAKE 1 CAPSULE DAILY 90 capsule 1  . naproxen (NAPROSYN) 500 MG tablet Take 1 tablet (500 mg total) by mouth 2 (two) times daily with a meal. (Patient not taking: Reported on 08/09/2018) 30 tablet 0   No facility-administered medications prior to visit.     No Known Allergies   PE; Blood pressure 140/82, pulse 69, temperature 98.1 F (36.7 C), temperature source Temporal, resp. rate 16, height 6' 1.5" (1.867 m), weight 250 lb 12.8 oz (113.8 kg), SpO2 97 %. Gen: Alert, well appearing.  Patient is oriented to person, place, time, and situation. AFFECT: pleasant, lucid thought and speech. CV: RRR, no m/r/g.   LUNGS: CTA bilat, nonlabored resps, good aeration in all lung fields. EXT: no clubbing or cyanosis.  1+ pitting R LL edema.  Trace pitting edema L LL.   Pertinent labs:  Lab Results  Component Value Date   TSH 0.84 07/24/2017   Lab Results  Component Value Date   WBC 7.9 02/01/2018   HGB 11.6 (L) 02/01/2018   HCT 37.0 (L) 02/01/2018   MCV 63.1 (L) 02/01/2018   PLT 302 02/01/2018   Lab Results  Component Value Date   CREATININE 1.05 02/01/2018   BUN 19 02/01/2018   NA 142 02/01/2018   K 4.0 02/01/2018   CL 104 02/01/2018   CO2 22 02/01/2018   Lab Results  Component Value Date   ALT 16 02/01/2018   AST 21 02/01/2018   ALKPHOS 48 07/08/2016   BILITOT 1.1 02/01/2018   Lab Results  Component Value Date   CHOL 116 02/01/2018   Lab Results  Component Value Date   HDL 42 02/01/2018   Lab Results  Component Value Date   LDLCALC 56 02/01/2018   Lab Results  Component Value Date   TRIG 94 02/01/2018   Lab Results  Component Value Date   CHOLHDL 2.8 02/01/2018    Lab Results  Component Value Date   PSA  0.86 02/01/2018   PSA 0.73 01/16/2017   PSA 0.85 05/09/2015   Lab Results  Component Value Date   HGBA1C 7.3 (A) 05/03/2018    ASSESSMENT AND PLAN:   1) DM 2; control fair. Hba1c today. He is rescheduling his eye exam. Lytes/cr today.  2) Uncontrolled HTN: not ideal control. Increase hctz to 25mg  qd. Lytes/cr today.  3) HLD: tolerating statin.  Last LDL was 56 six mo ago. Recheck 6 mo.  4) Preventative health care: we discussed the LACK of benefit of taking ASA for primary CV prevention (but still good for secondary prevention).  We discussed risk of GI and/or intracranial bleeding on ASA. I recommended he d/c his ASA but told him I'll leave it to him as to whether or not he will go ahead an do this.  An After Visit Summary was printed and given to the patient.  FOLLOW UP:  Return in about 3 months (around 11/09/2018) for routine chronic illness f/u.  Signed:  Crissie Sickles, MD           08/09/2018

## 2018-08-10 ENCOUNTER — Telehealth: Payer: Self-pay

## 2018-08-10 LAB — BASIC METABOLIC PANEL
BUN: 19 mg/dL (ref 7–25)
CO2: 23 mmol/L (ref 20–32)
Calcium: 9.5 mg/dL (ref 8.6–10.3)
Chloride: 106 mmol/L (ref 98–110)
Creat: 1.1 mg/dL (ref 0.70–1.18)
Glucose, Bld: 152 mg/dL — ABNORMAL HIGH (ref 65–99)
Potassium: 4.3 mmol/L (ref 3.5–5.3)
Sodium: 140 mmol/L (ref 135–146)

## 2018-08-10 LAB — HEMOGLOBIN A1C
Hgb A1c MFr Bld: 6.7 % of total Hgb — ABNORMAL HIGH (ref <5.7)
Mean Plasma Glucose: 146 (calc)
eAG (mmol/L): 8.1 (calc)

## 2018-08-10 NOTE — Telephone Encounter (Signed)
MyChart message sent with results.  

## 2018-09-14 ENCOUNTER — Encounter: Payer: Self-pay | Admitting: *Deleted

## 2018-09-21 ENCOUNTER — Other Ambulatory Visit: Payer: Self-pay | Admitting: Family Medicine

## 2018-10-23 ENCOUNTER — Other Ambulatory Visit: Payer: Self-pay | Admitting: Family Medicine

## 2018-11-02 DIAGNOSIS — R69 Illness, unspecified: Secondary | ICD-10-CM | POA: Diagnosis not present

## 2018-11-07 LAB — MICROALBUMIN, URINE: Microalb, Ur: 33.2

## 2018-11-08 ENCOUNTER — Other Ambulatory Visit: Payer: Self-pay

## 2018-11-08 ENCOUNTER — Ambulatory Visit (INDEPENDENT_AMBULATORY_CARE_PROVIDER_SITE_OTHER): Payer: Medicare HMO | Admitting: Family Medicine

## 2018-11-08 ENCOUNTER — Encounter: Payer: Self-pay | Admitting: Family Medicine

## 2018-11-08 VITALS — BP 138/73 | HR 80 | Temp 98.0°F | Resp 16 | Ht 73.5 in | Wt 240.0 lb

## 2018-11-08 DIAGNOSIS — K929 Disease of digestive system, unspecified: Secondary | ICD-10-CM | POA: Diagnosis not present

## 2018-11-08 DIAGNOSIS — I10 Essential (primary) hypertension: Secondary | ICD-10-CM

## 2018-11-08 DIAGNOSIS — E7800 Pure hypercholesterolemia, unspecified: Secondary | ICD-10-CM

## 2018-11-08 DIAGNOSIS — E119 Type 2 diabetes mellitus without complications: Secondary | ICD-10-CM | POA: Diagnosis not present

## 2018-11-08 LAB — POCT GLYCOSYLATED HEMOGLOBIN (HGB A1C)
HbA1c POC (<> result, manual entry): 6.8 % (ref 4.0–5.6)
HbA1c, POC (controlled diabetic range): 6.8 % (ref 0.0–7.0)
HbA1c, POC (prediabetic range): 6.8 % — AB (ref 5.7–6.4)
Hemoglobin A1C: 6.8 % — AB (ref 4.0–5.6)

## 2018-11-08 NOTE — Progress Notes (Signed)
OFFICE VISIT  11/08/2018   CC:  Chief Complaint  Patient presents with  . Follow-up    RCI. does not want flu shot. eye exam sched 01/20/2019.      HPI:    Patient is a 68 y.o. Caucasian male who presents for 3 mo f/u DM 2, HTN, HLD.  DM: home glucoses 140s or so.  HTN: Home bp checks consistently <130/80. HLD: tolerating statin. Diet getting better consistently.  Onset about 3 wks ago, Intermittent "flutter" sensation in RUQ (NO PAIN), nausea, some diarrhea on/off. These sx's last about a week and then resolve.  Not postprandial pattern.  No blood or pus in stool.  Brown to dark-brown color. Reports remote hx of "large gallstone but historically asymptomatic".  No urinary complaints.  Past Medical History:  Diagnosis Date  . Basal cell carcinoma (BCC) of right temple region 03/2017   Dr. Denna Haggard  . Cataract 07/2015   R eye--surgery recommended by his eye MD 08/03/15  . Colon cancer screening 02/15/2018   iFOB neg.  Repeat after 02/2019.  . Diabetes mellitus, type 2 ([place]) Dx'd 08/2013  . Erectile dysfunction    Dr. Gaynelle Arabian.  Testosterone normal 04/2016 at [facility].  . Gout of big toe 1990s   only one episode; he cut back on peanut butter and hasn't had any prob since  . History of prostatitis 1980s   Remote hx of seeing Dr. Terance Hart at [facility] urology--then Dr. Gaynelle Arabian.   Marland Kitchen Hyperlipidemia   . Hypertension dx'd 08/2013  . Kidney stones 1980s  . Microcytic anemia 06/2016  . Nodular basal cell carcinoma (BCC) 02/19/2017   right [id number]  . Thalassemia    Mild anemia with MCV 60s (alpha thal minor or beta thal trait: Hb electrophoresis not done/no result available.).    Past Surgical History:  Procedure Laterality Date  . NASAL POLYP EXCISION  1960    Outpatient Medications Prior to Visit  Medication Sig Dispense Refill  . amLODipine (NORVASC) 10 MG tablet TAKE 1 TABLET DAILY 90 tablet 1  . atorvastatin (LIPITOR) 20 MG tablet TAKE 1 TABLET DAILY 90 tablet 1   . cetirizine (ZYRTEC) 10 MG tablet Take 10 mg by mouth daily.    Marland Kitchen glucose blood test strip Use as instructed 100 each 6  . hydrochlorothiazide (HYDRODIURIL) 25 MG tablet Take 1 tablet (25 mg total) by mouth daily. 90 tablet 3  . hydrocortisone valerate ointment (WEST-CORT) 0.2 % Appy bid to affected area prn 15 g 2  . Lancets (FREESTYLE) lancets Use as instructed 100 each 12  . metFORMIN (GLUCOPHAGE) 1000 MG tablet TAKE 1 TABLET TWICE DAILY  WITH MEALS 180 tablet 1  . pioglitazone (ACTOS) 45 MG tablet TAKE 1 TABLET DAILY 30 tablet 0  . naproxen (NAPROSYN) 500 MG tablet Take 1 tablet (500 mg total) by mouth 2 (two) times daily with a meal. (Patient not taking: Reported on 08/09/2018) 30 tablet 0  . aspirin EC 81 MG tablet Take 1 tablet (81 mg total) by mouth daily. (Patient not taking: Reported on 11/08/2018) 30 tablet 0   No facility-administered medications prior to visit.     No Known Allergies  ROS As per HPI  PE: Blood pressure 138/73, pulse 80, temperature 98 F (36.7 C), temperature source Temporal, resp. rate 16, height 6' 1.5" (1.867 m), weight 240 lb (108.9 kg), SpO2 98 %. Gen: Alert, well appearing.  Patient is oriented to person, place, time, and situation. AFFECT: pleasant, lucid thought and speech. [id number]: no injection,  icteris, swelling, or exudate.  EOMI, PERRLA. Mouth: lips without lesion/swelling.  Oral mucosa pink and moist. Oropharynx without erythema, exudate, or swelling.  CV: RRR, no m/r/g.   LUNGS: CTA bilat, nonlabored resps, good aeration in all lung fields. ABD: soft, NT, ND, BS normal.  No hepatospenomegaly or mass.  No bruits. EXT: no clubbing or cyanosis.  1+ bilat pitting edema.  SKIN: no jaundice, rash, or pallor   LABS:  Lab Results  Component Value Date   TSH 0.84 07/24/2017   Lab Results  Component Value Date   WBC 7.9 02/01/2018   HGB 11.6 (L) 02/01/2018   HCT 37.0 (L) 02/01/2018   MCV 63.1 (L) 02/01/2018   PLT 302 02/01/2018   Lab  Results  Component Value Date   CREATININE 1.10 08/09/2018   BUN 19 08/09/2018   NA 140 08/09/2018   K 4.3 08/09/2018   CL 106 08/09/2018   CO2 23 08/09/2018   Lab Results  Component Value Date   ALT 16 02/01/2018   AST 21 02/01/2018   ALKPHOS 48 07/08/2016   BILITOT 1.1 02/01/2018   Lab Results  Component Value Date   CHOL 116 02/01/2018   Lab Results  Component Value Date   HDL 42 02/01/2018   Lab Results  Component Value Date   LDLCALC 56 02/01/2018   Lab Results  Component Value Date   TRIG 94 02/01/2018   Lab Results  Component Value Date   CHOLHDL 2.8 02/01/2018   Lab Results  Component Value Date   PSA 0.86 02/01/2018   PSA 0.73 01/16/2017   PSA 0.85 05/09/2015   Lab Results  Component Value Date   HGBA1C 6.8 (A) 11/08/2018   HGBA1C 6.8 11/08/2018   HGBA1C 6.8 (A) 11/08/2018   HGBA1C 6.8 11/08/2018   POC HbA1c 6.8%  IMPRESSION AND PLAN:  1) DM 2: doing great. A1c today 6.8%. No changes.  2) HTN: The current medical regimen is effective;  continue present plan and medications. Will repeat lytes/cr in 3 mo at CPE visit.  3) HLD: tolerating statin.  Lipids excellent 01/2018. Rpt FLP and hepatic panel in 3 mo at CPE visit.  4) Functional GI sx's: reassured.  Watchful waiting. Signs/symptoms to call or return for were reviewed and pt expressed understanding.  An After Visit Summary was printed and given to the patient.  FOLLOW UP: Return in about 3 months (around 02/07/2019) for annual CPE (fasting).  Signed:  Crissie Sickles, MD           11/08/2018

## 2018-11-15 ENCOUNTER — Encounter (HOSPITAL_COMMUNITY): Payer: Self-pay

## 2018-11-15 ENCOUNTER — Emergency Department (HOSPITAL_COMMUNITY): Payer: Medicare HMO

## 2018-11-15 ENCOUNTER — Other Ambulatory Visit: Payer: Self-pay

## 2018-11-15 ENCOUNTER — Emergency Department (HOSPITAL_COMMUNITY)
Admission: EM | Admit: 2018-11-15 | Discharge: 2018-11-15 | Disposition: A | Discharge status: Home / Self Care | Payer: Medicare HMO | Attending: Emergency Medicine | Admitting: Emergency Medicine

## 2018-11-15 ENCOUNTER — Telehealth: Payer: Self-pay | Admitting: Family Medicine

## 2018-11-15 DIAGNOSIS — I10 Essential (primary) hypertension: Secondary | ICD-10-CM | POA: Diagnosis not present

## 2018-11-15 DIAGNOSIS — E119 Type 2 diabetes mellitus without complications: Secondary | ICD-10-CM | POA: Insufficient documentation

## 2018-11-15 DIAGNOSIS — Z87891 Personal history of nicotine dependence: Secondary | ICD-10-CM | POA: Insufficient documentation

## 2018-11-15 DIAGNOSIS — S99921A Unspecified injury of right foot, initial encounter: Secondary | ICD-10-CM | POA: Diagnosis not present

## 2018-11-15 DIAGNOSIS — M79671 Pain in right foot: Secondary | ICD-10-CM | POA: Diagnosis not present

## 2018-11-15 DIAGNOSIS — M79673 Pain in unspecified foot: Secondary | ICD-10-CM

## 2018-11-15 NOTE — ED Provider Notes (Signed)
[facility] Emergency Department Provider Note MRN:  [id number]  Arrival date & time: 11/15/18     Chief Complaint   Foot Pain   History of Present Illness   Zachary Chandler is a 68 y.o. year-old male with a history of diabetes presenting to the ED with chief complaint of foot pain.  1 week ago patient was getting out of the car and his right foot got caught beneath 1 of the pedals.  Experiencing mild pain at that time.  Since that time he has endorsed pain to the right foot that is not getting better.  He rests it for a few days and that improves the pain but then he tries to walk on it again and it exacerbates the pain.  Denies any other injuries, pain is mild, constant, worse with ambulation.  Review of Systems  A complete 10 system review of systems was obtained and all systems are negative except as noted in the HPI and PMH.   Patient's Health History    Past Medical History:  Diagnosis Date  . Basal cell carcinoma (BCC) of right temple region 03/2017   Dr. Denna Haggard  . Cataract 07/2015   R eye--surgery recommended by his eye MD 08/03/15  . Colon cancer screening 02/15/2018   iFOB neg.  Repeat after 02/2019.  . Diabetes mellitus, type 2 ([place]) Dx'd 08/2013  . Erectile dysfunction    Dr. Gaynelle Arabian.  Testosterone normal 04/2016 at [facility].  . Gout of big toe 1990s   only one episode; he cut back on peanut butter and hasn't had any prob since  . History of prostatitis 1980s   Remote hx of seeing Dr. Terance Hart at [facility] urology--then Dr. Gaynelle Arabian.   Marland Kitchen Hyperlipidemia   . Hypertension dx'd 08/2013  . Kidney stones 1980s  . Microcytic anemia 06/2016  . Nodular basal cell carcinoma (BCC) 02/19/2017   right [id number]  . Thalassemia    Mild anemia with MCV 60s (alpha thal minor or beta thal trait: Hb electrophoresis not done/no result available.).    Past Surgical History:  Procedure Laterality Date  . NASAL POLYP EXCISION  1960    Family History   Problem Relation Age of Onset  . Heart disease Mother   . Hypertension Mother   . Diabetes Mother   . Heart disease Father   . Heart attack Sister     Social History   Socioeconomic History  . Marital status: Married    Spouse name: Not on file  . Number of children: Not on file  . Years of education: Not on file  . Highest education level: Not on file  Occupational History  . Not on file  Social Needs  . Financial resource strain: Not on file  . Food insecurity    Worry: Not on file    Inability: Not on file  . Transportation needs    Medical: Not on file    Non-medical: Not on file  Tobacco Use  . Smoking status: Former Smoker    Quit date: 02/28/1970    Years since quitting: 48.7  . Smokeless tobacco: Never Used  Substance and Sexual Activity  . Alcohol use: No    Alcohol/week: 0.0 standard drinks  . Drug use: No  . Sexual activity: Not on file  Lifestyle  . Physical activity    Days per week: Not on file    Minutes per session: Not on file  . Stress: Not on file  Relationships  .  Social Herbalist on phone: Not on file    Gets together: Not on file    Attends religious service: Not on file    Active member of club or organization: Not on file    Attends meetings of clubs or organizations: Not on file    Relationship status: Not on file  . Intimate partner violence    Fear of current or ex partner: Not on file    Emotionally abused: Not on file    Physically abused: Not on file    Forced sexual activity: Not on file  Other Topics Concern  . Not on file  Social History Narrative   Married, has one 77 y/o son.   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     Orig from [place].   Was in [employer] [phone].  No combat duty.  No VA care.   Smoked 10-12 yrs, quit in his early 47s.   Alcohol: none   No hx of drugs.              Physical Exam  Vital Signs and Nursing Notes reviewed Vitals:   11/15/18 1158  BP: (!) 162/88   Pulse: 97  Resp: 16  Temp: 98 F (36.7 C)  SpO2: 98%    CONSTITUTIONAL: Well-appearing, NAD NEURO:  Alert and oriented x 3, no focal deficits EYES:  eyes equal and reactive ENT/NECK:  no LAD, no JVD CARDIO: Regular rate, well-perfused, normal S1 and S2 PULM:  CTAB no wheezing or rhonchi GI/GU:  normal bowel sounds, non-distended, non-tender MSK/SPINE:  No gross deformities, no edema; mild bruising to the dorsal aspect of the right foot, neurovascular intact distally SKIN:  no rash, atraumatic PSYCH:  Appropriate speech and behavior  Diagnostic and Interventional Summary    Labs Reviewed - No data to display  DG Foot Complete Right  Final Result      Medications - No data to display   Procedures Critical Care  ED Course and Medical Decision Making  I have reviewed the triage vital signs and the nursing notes.  Pertinent labs & imaging results that were available during my care of the patient were reviewed by me and considered in my medical decision making (see below for details).  X-ray negative, consistent with sprain, advised more aggressive rest, anti-inflammatories, Ortho follow-up if not improving.  Barth Kirks. [place] Small, [place] [email or web address]  Final Clinical Impressions(s) / ED Diagnoses     ICD-10-CM   1. Right foot pain  M79.671     ED Discharge Orders    None      Discharge Instructions Discussed with and Provided to Patient:   Discharge Instructions     You were evaluated in the Emergency Department and after careful evaluation, we did not find any emergent condition requiring admission or further testing in the hospital.  Your exam/testing today was overall reassuring.  Your symptoms seem to be due to a sprain or injury to the ligaments.  We recommend continued rest, Tylenol and ibuprofen at home for discomfort.  If still causing you issues after 2 more weeks, I would recommend follow-up with an  orthopedic specialist.  Please return to the Emergency Department if you experience any worsening of your condition.  We encourage you to follow up with a primary care provider.  Thank you for allowing [place] to be a part of your care.       Gerlene Fee  M, MD 11/15/18 1530

## 2018-11-15 NOTE — Telephone Encounter (Signed)
FYI - Wife called to say patient can hardly walk, she was going to ER with patient

## 2018-11-15 NOTE — ED Triage Notes (Signed)
Pt presents to ED with complaints of right foot pain x 2 weeks. Pt states he got his foot caught in the door of a car 2 weeks ago. Pt denies any lacerations or sores to foot.

## 2018-11-15 NOTE — Telephone Encounter (Signed)
FYI  Please see below

## 2018-11-15 NOTE — Telephone Encounter (Signed)
OK, ortho referral ordered

## 2018-11-15 NOTE — Telephone Encounter (Signed)
Pt had routine follow up visit on 11/08/18. No mention of foot complaint in OV unless verbally discussed with PCP.  Please advise on referral, thanks.

## 2018-11-15 NOTE — Telephone Encounter (Signed)
Noted. ED visit records reviewed: right foot x-ray normal.  Dx'd with sprain.

## 2018-11-15 NOTE — Discharge Instructions (Addendum)
You were evaluated in the Emergency Department and after careful evaluation, we did not find any emergent condition requiring admission or further testing in the hospital.  Your exam/testing today was overall reassuring.  Your symptoms seem to be due to a sprain or injury to the ligaments.  We recommend continued rest, Tylenol and ibuprofen at home for discomfort.  If still causing you issues after 2 more weeks, I would recommend follow-up with an orthopedic specialist.  Please return to the Emergency Department if you experience any worsening of your condition.  We encourage you to follow up with a primary care provider.  Thank you for allowing [place] to be a part of your care.

## 2018-11-15 NOTE — Telephone Encounter (Signed)
Spoke to Dr. Anitra Lauth last week regarding his foot. Today her reports he can hardly walk on it. Requests referral to orthopaedic  Please contact patient [phone]

## 2018-11-18 ENCOUNTER — Other Ambulatory Visit: Payer: Self-pay | Admitting: Family Medicine

## 2018-11-22 DIAGNOSIS — H35363 Drusen (degenerative) of macula, bilateral: Secondary | ICD-10-CM | POA: Diagnosis not present

## 2018-11-22 DIAGNOSIS — E119 Type 2 diabetes mellitus without complications: Secondary | ICD-10-CM | POA: Diagnosis not present

## 2018-11-22 DIAGNOSIS — Z961 Presence of intraocular lens: Secondary | ICD-10-CM | POA: Diagnosis not present

## 2018-12-14 DIAGNOSIS — H43813 Vitreous degeneration, bilateral: Secondary | ICD-10-CM | POA: Diagnosis not present

## 2018-12-14 DIAGNOSIS — E113293 Type 2 diabetes mellitus with mild nonproliferative diabetic retinopathy without macular edema, bilateral: Secondary | ICD-10-CM | POA: Diagnosis not present

## 2018-12-14 DIAGNOSIS — H35431 Paving stone degeneration of retina, right eye: Secondary | ICD-10-CM | POA: Diagnosis not present

## 2018-12-14 DIAGNOSIS — H353132 Nonexudative age-related macular degeneration, bilateral, intermediate dry stage: Secondary | ICD-10-CM | POA: Diagnosis not present

## 2018-12-14 LAB — HM DIABETES EYE EXAM

## 2018-12-21 ENCOUNTER — Encounter: Payer: Self-pay | Admitting: Family Medicine

## 2018-12-22 ENCOUNTER — Encounter: Payer: Self-pay | Admitting: Family Medicine

## 2018-12-27 ENCOUNTER — Telehealth: Payer: Self-pay | Admitting: Family Medicine

## 2018-12-27 ENCOUNTER — Encounter: Payer: Self-pay | Admitting: Family Medicine

## 2018-12-27 NOTE — Telephone Encounter (Signed)
Urine microalbumin test from outside lab was mildly elevated. This is sign of early damage to the kidneys from diabetes. I don't want him to worry too much about this, but I do recommend he start a low dose of lisinopril, which can help to minimize any further kidney damage from his diabetes. If pt agreeable, pls eRx lisinopril 5mg , 1 tab po qd. Plan on routine f/u in office in about 6-8 wks.-thx

## 2018-12-28 ENCOUNTER — Other Ambulatory Visit: Payer: Self-pay

## 2018-12-28 MED ORDER — LISINOPRIL 5 MG PO TABS: 5.0000 mg | ORAL_TABLET | Freq: Every day | ORAL | 1 refills | Status: DC

## 2018-12-28 NOTE — Telephone Encounter (Signed)
Patient advised and voiced understanding. Agreeable to starting med, Rx sent in. F/u appt made for 8 weeks, in office.

## 2018-12-28 NOTE — Progress Notes (Signed)
lisin

## 2019-01-20 ENCOUNTER — Other Ambulatory Visit: Payer: Self-pay | Admitting: Family Medicine

## 2019-02-11 DIAGNOSIS — K8020 Calculus of gallbladder without cholecystitis without obstruction: Secondary | ICD-10-CM

## 2019-02-11 DIAGNOSIS — N183 Chronic kidney disease, stage 3 unspecified: Secondary | ICD-10-CM

## 2019-02-11 HISTORY — DX: Calculus of gallbladder without cholecystitis without obstruction: K80.20

## 2019-02-11 HISTORY — DX: Chronic kidney disease, stage 3 unspecified: N18.30

## 2019-02-14 ENCOUNTER — Other Ambulatory Visit: Payer: Self-pay | Admitting: Family Medicine

## 2019-02-21 ENCOUNTER — Other Ambulatory Visit: Payer: Self-pay

## 2019-02-22 ENCOUNTER — Encounter: Payer: Self-pay | Admitting: Family Medicine

## 2019-02-22 ENCOUNTER — Ambulatory Visit (INDEPENDENT_AMBULATORY_CARE_PROVIDER_SITE_OTHER): Payer: Medicare HMO | Admitting: Family Medicine

## 2019-02-22 VITALS — BP 138/88 | HR 88 | Temp 98.2°F | Resp 16 | Ht 73.5 in | Wt 242.2 lb

## 2019-02-22 DIAGNOSIS — E7800 Pure hypercholesterolemia, unspecified: Secondary | ICD-10-CM

## 2019-02-22 DIAGNOSIS — R809 Proteinuria, unspecified: Secondary | ICD-10-CM | POA: Diagnosis not present

## 2019-02-22 DIAGNOSIS — I10 Essential (primary) hypertension: Secondary | ICD-10-CM | POA: Diagnosis not present

## 2019-02-22 DIAGNOSIS — E1129 Type 2 diabetes mellitus with other diabetic kidney complication: Secondary | ICD-10-CM

## 2019-02-22 MED ORDER — LISINOPRIL 10 MG PO TABS: 10.0000 mg | ORAL_TABLET | Freq: Every day | ORAL | 1 refills | Status: DC

## 2019-02-22 NOTE — Progress Notes (Signed)
OFFICE VISIT  02/22/2019   CC:  Chief Complaint  Patient presents with  . Follow-up    RCI, pt is fasting   HPI:    Patient is a 69 y.o. Caucasian male who presents for 3 mo f/u DM 2 with microalbuminuria (mild), HTN, HLD. A/P as of last visit: "1) DM 2: doing great. A1c today 6.8%. No changes.  2) HTN: The current medical regimen is effective;  continue present plan and medications. Will repeat lytes/cr in 3 mo at CPE visit.  3) HLD: tolerating statin.  Lipids excellent 01/2018. Rpt FLP and hepatic panel in 3 mo at CPE visit.  4) Functional GI sx's: reassured.  Watchful waiting. Signs/symptoms to call or return for were reviewed and pt expressed understanding."  Interim hx:  DM:  Says diet not good last 2 mo over holidays.  Home gluc's 170s consistently fasting.  No checks later in the day. Feet->some musculoskeletal feet pain on/off, but no burning, tingling, or numbness.  HTN: home monitoring consistently 135-140/70s-80s.  HLD: taking statin daily.    Past Medical History:  Diagnosis Date  . Basal cell carcinoma (BCC) of right temple region 03/2017   Dr. Denna Haggard  . Cataract 07/2015   R eye--surgery recommended by his eye MD 08/03/15  . Colon cancer screening 02/15/2018   iFOB neg.  Repeat after 02/2019.  . Erectile dysfunction    Dr. Gaynelle Arabian.  Testosterone normal 04/2016 at [facility].  . Gout of big toe 1990s   only one episode; he cut back on peanut butter and hasn't had any prob since  . History of prostatitis 1980s   Remote hx of seeing Dr. Terance Hart at [facility] urology--then Dr. Gaynelle Arabian.   Marland Kitchen Hyperlipidemia   . Hypertension dx'd 08/2013  . Kidney stones 1980s  . Microcytic anemia 06/2016  . Nodular basal cell carcinoma (BCC) 02/19/2017   right temple-[id number]  . Thalassemia    Mild anemia with MCV 60s (alpha thal minor or beta thal trait: Hb electrophoresis not done/no result available.).  Marland Kitchen Type 2 diabetes mellitus with microalbuminuria ([place]) Dx'd 08/2013     Past Surgical History:  Procedure Laterality Date  . NASAL POLYP EXCISION  1960    Outpatient Medications Prior to Visit  Medication Sig Dispense Refill  . amLODipine (NORVASC) 10 MG tablet TAKE 1 TABLET DAILY 90 tablet 1  . atorvastatin (LIPITOR) 20 MG tablet TAKE 1 TABLET DAILY 90 tablet 1  . cetirizine (ZYRTEC) 10 MG tablet Take 10 mg by mouth daily.    Marland Kitchen glucose blood test strip Use as instructed 100 each 6  . hydrochlorothiazide (HYDRODIURIL) 25 MG tablet Take 1 tablet (25 mg total) by mouth daily. 90 tablet 3  . hydrocortisone valerate ointment (WEST-CORT) 0.2 % Appy bid to affected area prn 15 g 2  . Lancets (FREESTYLE) lancets Use as instructed 100 each 12  . metFORMIN (GLUCOPHAGE) 1000 MG tablet TAKE 1 TABLET TWICE DAILY  WITH MEALS 180 tablet 1  . pioglitazone (ACTOS) 45 MG tablet TAKE 1 TABLET DAILY 90 tablet 0  . lisinopril (ZESTRIL) 5 MG tablet TAKE 1 TABLET BY MOUTH EVERY DAY 90 tablet 0  . naproxen (NAPROSYN) 500 MG tablet Take 1 tablet (500 mg total) by mouth 2 (two) times daily with a meal. (Patient not taking: Reported on 08/09/2018) 30 tablet 0   No facility-administered medications prior to visit.    No Known Allergies  ROS As per HPI  PE: Manual bp recheck at end of visit was 138/88  Blood pressure 138/88, pulse 88, temperature 98.2 F (36.8 C), temperature source Temporal, resp. rate 16, height 6' 1.5" (1.867 m), weight 242 lb 3.2 oz (109.9 kg), SpO2 99 %. Body mass index is 31.52 kg/m.  Gen: Alert, well appearing.  Patient is oriented to person, place, time, and situation. AFFECT: pleasant, lucid thought and speech. Foot exam -- no swelling, tenderness or skin or vascular lesions. Color and temperature is normal. Sensation is intact. Peripheral pulses are palpable. Toenails are normal.   LABS:  Lab Results  Component Value Date   TSH 0.84 07/24/2017   Lab Results  Component Value Date   WBC 7.9 02/01/2018   HGB 11.6 (L) 02/01/2018   HCT 37.0  (L) 02/01/2018   MCV 63.1 (L) 02/01/2018   PLT 302 02/01/2018   Lab Results  Component Value Date   IRON 187 (H) 07/09/2016   FERRITIN 260.1 07/09/2016  No results found for: VITAMINB12   Lab Results  Component Value Date   CREATININE 1.10 08/09/2018   BUN 19 08/09/2018   NA 140 08/09/2018   K 4.3 08/09/2018   CL 106 08/09/2018   CO2 23 08/09/2018   Lab Results  Component Value Date   ALT 16 02/01/2018   AST 21 02/01/2018   ALKPHOS 48 07/08/2016   BILITOT 1.1 02/01/2018   Lab Results  Component Value Date   CHOL 116 02/01/2018   Lab Results  Component Value Date   HDL 42 02/01/2018   Lab Results  Component Value Date   LDLCALC 56 02/01/2018   Lab Results  Component Value Date   TRIG 94 02/01/2018   Lab Results  Component Value Date   CHOLHDL 2.8 02/01/2018   Lab Results  Component Value Date   PSA 0.86 02/01/2018   PSA 0.73 01/16/2017   PSA 0.85 05/09/2015   Lab Results  Component Value Date   HGBA1C 6.8 (A) 11/08/2018   HGBA1C 6.8 11/08/2018   HGBA1C 6.8 (A) 11/08/2018   HGBA1C 6.8 11/08/2018    IMPRESSION AND PLAN:  1) DM 2, control not as good as his usual.  Dietary indiscretion. Feet exam normal today. HbA1c today. Lytes/cr today.  2) HTN: not well controlled. Inc lisinopril to 10mg  qd. Discussed goal of <130/80 again today. BMET today.  3) HLD: tolerating statin.  FLP and hepatic panel today.  He declines flu vaccine.  An After Visit Summary was printed and given to the patient.  FOLLOW UP: Return in about 3 months (around 05/23/2019) for annual CPE (fasting).  Signed:  Crissie Sickles, MD           02/22/2019

## 2019-02-23 LAB — COMPREHENSIVE METABOLIC PANEL
AG Ratio: 1.8 (calc) (ref 1.0–2.5)
ALT: 14 U/L (ref 9–46)
AST: 17 U/L (ref 10–35)
Albumin: 4.4 g/dL (ref 3.6–5.1)
Alkaline phosphatase (APISO): 57 U/L (ref 35–144)
BUN: 21 mg/dL (ref 7–25)
CO2: 24 mmol/L (ref 20–32)
Calcium: 9.6 mg/dL (ref 8.6–10.3)
Chloride: 102 mmol/L (ref 98–110)
Creat: 1.01 mg/dL (ref 0.70–1.18)
Globulin: 2.5 g/dL (calc) (ref 1.9–3.7)
Glucose, Bld: 153 mg/dL — ABNORMAL HIGH (ref 65–99)
Potassium: 4.3 mmol/L (ref 3.5–5.3)
Sodium: 138 mmol/L (ref 135–146)
Total Bilirubin: 1 mg/dL (ref 0.2–1.2)
Total Protein: 6.9 g/dL (ref 6.1–8.1)

## 2019-02-23 LAB — HEMOGLOBIN A1C
Hgb A1c MFr Bld: 6.8 % of total Hgb — ABNORMAL HIGH (ref <5.7)
Mean Plasma Glucose: 148 (calc)
eAG (mmol/L): 8.2 (calc)

## 2019-02-23 LAB — LIPID PANEL
Cholesterol: 108 mg/dL (ref <200)
HDL: 37 mg/dL — ABNORMAL LOW (ref >=40)
LDL Cholesterol (Calc): 48 mg/dL (calc)
Non-HDL Cholesterol (Calc): 71 mg/dL (calc) (ref <130)
Total CHOL/HDL Ratio: 2.9 (calc) (ref <5.0)
Triglycerides: 150 mg/dL — ABNORMAL HIGH (ref <150)

## 2019-02-28 ENCOUNTER — Emergency Department (HOSPITAL_COMMUNITY): Payer: Medicare HMO

## 2019-02-28 ENCOUNTER — Other Ambulatory Visit: Payer: Self-pay

## 2019-02-28 ENCOUNTER — Emergency Department (HOSPITAL_COMMUNITY)
Admission: EM | Admit: 2019-02-28 | Discharge: 2019-02-28 | Disposition: A | Discharge status: Home / Self Care | Payer: Medicare HMO | Attending: Emergency Medicine | Admitting: Emergency Medicine

## 2019-02-28 ENCOUNTER — Encounter (HOSPITAL_COMMUNITY): Payer: Self-pay

## 2019-02-28 DIAGNOSIS — Y9389 Activity, other specified: Secondary | ICD-10-CM | POA: Diagnosis not present

## 2019-02-28 DIAGNOSIS — E119 Type 2 diabetes mellitus without complications: Secondary | ICD-10-CM | POA: Diagnosis not present

## 2019-02-28 DIAGNOSIS — Y92008 Other place in unspecified non-institutional (private) residence as the place of occurrence of the external cause: Secondary | ICD-10-CM | POA: Diagnosis not present

## 2019-02-28 DIAGNOSIS — Z87891 Personal history of nicotine dependence: Secondary | ICD-10-CM | POA: Diagnosis not present

## 2019-02-28 DIAGNOSIS — W109XXA Fall (on) (from) unspecified stairs and steps, initial encounter: Secondary | ICD-10-CM | POA: Diagnosis not present

## 2019-02-28 DIAGNOSIS — Z79899 Other long term (current) drug therapy: Secondary | ICD-10-CM | POA: Diagnosis not present

## 2019-02-28 DIAGNOSIS — S4991XA Unspecified injury of right shoulder and upper arm, initial encounter: Secondary | ICD-10-CM | POA: Diagnosis not present

## 2019-02-28 DIAGNOSIS — Z7984 Long term (current) use of oral hypoglycemic drugs: Secondary | ICD-10-CM | POA: Diagnosis not present

## 2019-02-28 DIAGNOSIS — S42251A Displaced fracture of greater tuberosity of right humerus, initial encounter for closed fracture: Secondary | ICD-10-CM

## 2019-02-28 DIAGNOSIS — Y999 Unspecified external cause status: Secondary | ICD-10-CM | POA: Diagnosis not present

## 2019-02-28 DIAGNOSIS — S42254A Nondisplaced fracture of greater tuberosity of right humerus, initial encounter for closed fracture: Secondary | ICD-10-CM | POA: Diagnosis not present

## 2019-02-28 DIAGNOSIS — M79601 Pain in right arm: Secondary | ICD-10-CM | POA: Diagnosis not present

## 2019-02-28 DIAGNOSIS — I10 Essential (primary) hypertension: Secondary | ICD-10-CM | POA: Diagnosis not present

## 2019-02-28 HISTORY — DX: Displaced fracture of greater tuberosity of right humerus, initial encounter for closed fracture: S42.251A

## 2019-02-28 MED ORDER — ACETAMINOPHEN 325 MG PO TABS
650.0000 mg | ORAL_TABLET | Freq: Once | ORAL | Status: AC
  Administered 2019-02-28: 11:00:00 650 mg via ORAL
  Filled 2019-02-28: qty 2

## 2019-02-28 MED ORDER — HYDROCODONE-ACETAMINOPHEN 5-325 MG PO TABS: 1.0000 | ORAL_TABLET | Freq: Four times a day (QID) | ORAL | 0 refills | Status: DC | PRN

## 2019-02-28 NOTE — ED Triage Notes (Signed)
Pt reports he was getting some equip,emt out of shed and was taking a shop vac down down stairs and hose tripped him up. Landed on hand. Injured right shoulder. Feels like it locks up

## 2019-02-28 NOTE — ED Provider Notes (Signed)
[facility] EMERGENCY DEPARTMENT Provider Note   CSN: [id number] Arrival date & time: 02/28/19  0830     History Chief Complaint  Patient presents with  . Shoulder Injury    Zachary Chandler is a 69 y.o. male who presents to the ED today complaining of sudden onset, constant, worsening, right shoulder pain s/p mechanical fall that occurred earlier today.  She reports he was working out in his shed and lifting a shop vac down 2 sets of stairs when the hose of the vacuum went underneath his right foot causing him to slip.  Patient states that he stumbled around and landed on his bilateral outstretched hands.  No head injury or loss of consciousness.  States that afterwards he noticed he was having right shoulder pain that has gradually gotten worse since then.  Patient states the pain is exacerbated with movement of the shoulder.  No previous injury or surgery to the shoulder.  He has not taken anything for pain prior to arrival.   The history is provided by the patient.       Past Medical History:  Diagnosis Date  . Basal cell carcinoma (BCC) of right temple region 03/2017   Dr. Denna Haggard  . Cataract 07/2015   R eye--surgery recommended by his eye MD 08/03/15  . Colon cancer screening 02/15/2018   iFOB neg.  Repeat after 02/2019.  . Erectile dysfunction    Dr. Gaynelle Arabian.  Testosterone normal 04/2016 at [facility]  . Gout of big toe 1990s   only one episode; he cut back on peanut butter and hasn't had any prob since  . History of prostatitis 1980s   Remote hx of seeing Dr. Terance Hart at [facility] urology--then Dr. Gaynelle Arabian.   Marland Kitchen Hyperlipidemia   . Hypertension dx'd 08/2013  . Kidney stones 1980s  . Microcytic anemia 06/2016  . Nodular basal cell carcinoma (BCC) 02/19/2017   right [id number]  . Thalassemia    Mild anemia with MCV 60s (alpha thal minor or beta thal trait: Hb electrophoresis not done/no result available.).  Marland Kitchen Type 2 diabetes mellitus with microalbuminuria ([place]) Dx'd 08/2013     Patient Active Problem List   Diagnosis Date Noted  . Left shoulder pain 05/10/2015  . Diabetes mellitus without complication ([place]) 123456  . Hyperlipidemia 02/07/2014  . Diabetes mellitus with complication ([place]) XX123456  . Acute prostatitis 08/31/2013  . HTN (hypertension), benign 08/31/2013    Past Surgical History:  Procedure Laterality Date  . NASAL POLYP EXCISION  1960       Family History  Problem Relation Age of Onset  . Heart disease Mother   . Hypertension Mother   . Diabetes Mother   . Heart disease Father   . Heart attack Sister     Social History   Tobacco Use  . Smoking status: Former Smoker    Quit date: 02/28/1970    Years since quitting: 49.0  . Smokeless tobacco: Never Used  Substance Use Topics  . Alcohol use: No    Alcohol/week: 0.0 standard drinks  . Drug use: No    Home Medications Prior to Admission medications   Medication Sig Start Date End Date Taking? Authorizing Provider  amLODipine (NORVASC) 10 MG tablet TAKE 1 TABLET DAILY 09/21/18   McGowen, Adrian Blackwater, MD  atorvastatin (LIPITOR) 20 MG tablet TAKE 1 TABLET DAILY 09/21/18   McGowen, Adrian Blackwater, MD  cetirizine (ZYRTEC) 10 MG tablet Take 10 mg by mouth daily.    [provider]  glucose  blood test strip Use as instructed 02/07/14   McGowen, Adrian Blackwater, MD  hydrochlorothiazide (HYDRODIURIL) 25 MG tablet Take 1 tablet (25 mg total) by mouth daily. 08/09/18   McGowen, Adrian Blackwater, MD  HYDROcodone-acetaminophen (NORCO/VICODIN) 5-325 MG tablet Take 1 tablet by mouth every 6 (six) hours as needed for severe pain. 02/28/19   Eustaquio Maize, PA-C  hydrocortisone valerate ointment (WEST-CORT) 0.2 % Appy bid to affected area prn 07/24/17   McGowen, Adrian Blackwater, MD  Lancets (FREESTYLE) lancets Use as instructed 09/21/13   McGowen, Adrian Blackwater, MD  lisinopril (ZESTRIL) 10 MG tablet Take 1 tablet (10 mg total) by mouth daily. 02/22/19   McGowen, Adrian Blackwater, MD  metFORMIN (GLUCOPHAGE) 1000 MG tablet TAKE  1 TABLET TWICE DAILY  WITH MEALS 09/21/18   McGowen, Adrian Blackwater, MD  naproxen (NAPROSYN) 500 MG tablet Take 1 tablet (500 mg total) by mouth 2 (two) times daily with a meal. Patient not taking: Reported on 08/09/2018 12/15/17   Raoul Pitch, Renee A, DO  pioglitazone (ACTOS) 45 MG tablet TAKE 1 TABLET DAILY 02/15/19   McGowen, Adrian Blackwater, MD    Allergies    Patient has no known allergies.  Review of Systems   Review of Systems  Musculoskeletal: Positive for arthralgias.  Neurological: Negative for syncope and headaches.  All other systems reviewed and are negative.   Physical Exam Updated Vital Signs BP (!) 159/77 (BP Location: Left Arm)   Pulse 80   Temp 98.5 F (36.9 C) (Oral)   Resp 18   Ht 6\' 1"  (1.854 m)   Wt 109.3 kg   SpO2 98%   BMI 31.80 kg/m   Physical Exam Vitals and nursing note reviewed.  Constitutional:      Appearance: He is not ill-appearing or diaphoretic.  HENT:     Head: Normocephalic and atraumatic.     Comments: No raccoon's sign or battle's sign. Negative hemotympanum bilaterally.  Eyes:     Conjunctiva/sclera: Conjunctivae normal.  Cardiovascular:     Rate and Rhythm: Normal rate and regular rhythm.     Pulses: Normal pulses.  Pulmonary:     Effort: Pulmonary effort is normal.     Breath sounds: Normal breath sounds. No wheezing, rhonchi or rales.  Chest:     Chest wall: No tenderness.  Abdominal:     Palpations: Abdomen is soft.     Tenderness: There is no abdominal tenderness. There is no guarding or rebound.  Musculoskeletal:     Cervical back: Neck supple.     Comments: No C, T, or L midline spinal tenderness to palpation.   No obvious deformity noted to right shoulder. + TTP to anterior shoulder along joint line. ROM limited due to pain. Strength 5/5 bilaterally. 2+ radial pulse.   Skin:    General: Skin is warm and dry.  Neurological:     Mental Status: He is alert.     Comments: CN 3-12 grossly intact A&O x4 GCS 15 Sensation and strength  intact Gait nonataxic including with tandem walking Coordination with finger-to-nose WNL Neg romberg, neg pronator drift     ED Results / Procedures / Treatments   Labs (all labs ordered are listed, but only abnormal results are displayed) Labs Reviewed - No data to display  EKG None  Radiology DG Shoulder Right  Result Date: 02/28/2019 CLINICAL DATA:  [place] this morning and injured right shoulder. EXAM: RIGHT SHOULDER - 2+ VIEW COMPARISON:  None. FINDINGS: [facility] joint and glenohumeral joints are maintained.  Mild degenerative changes. On the axillary view findings suspicious for a small nondisplaced greater tuberosity fracture. No humeral neck fracture is identified. The visualized right ribs are intact and the visualized right lung is clear. IMPRESSION: Suspect small nondisplaced greater tuberosity fracture. Electronically Signed   By: Marijo Sanes M.D.   On: 02/28/2019 09:47    Procedures Procedures (including critical care time)  Medications Ordered in ED Medications  acetaminophen (TYLENOL) tablet 650 mg (has no administration in time range)    ED Course  I have reviewed the triage vital signs and the nursing notes.  Pertinent labs & imaging results that were available during my care of the patient were reviewed by me and considered in my medical decision making (see chart for details).  69 year old male who presents to the ED today complaining of right shoulder pain after mechanical fall earlier today where he landed on his bilateral outstretched hands.  No other complaints at this time.  On exam patient has tenderness to the joint line of the right shoulder anteriorly.  Range of motion is limited due to pain however strength and sensation intact throughout.  Patient neurovascularly intact today.  Signs of head trauma.  No midline back pain.  No injuries to the hips.  Will obtain x-ray of the right shoulder and reevaluate.   X-ray of shoulder consistent with a small  nondisplaced greater tuberosity fracture.  Arm sling applied. Pt given Tylenol for pain as he does not want anything stronger in the ED. I will prescribe a short course of Norco as needed. Pt advised to follow up with orthopedist. He is in agreement with plan at this time. I have discussed case with attending physician Dr. Regenia Skeeter who is in agreement with plan. Discharged home.   This note was prepared using Dragon voice recognition software and may include unintentional dictation errors due to the inherent limitations of voice recognition software.     MDM Rules/Calculators/A&P                       Final Clinical Impression(s) / ED Diagnoses Final diagnoses:  Closed nondisplaced fracture of greater tuberosity of right humerus, initial encounter    Rx / DC Orders ED Discharge Orders         Ordered    HYDROcodone-acetaminophen (NORCO/VICODIN) 5-325 MG tablet  Every 6 hours PRN     02/28/19 1055           Discharge Instructions     Please follow up with orthopedist Dr. Mardelle Matte for further evaluation of your fracture Continue wearing arm sling until you are seen by orthopedics I have prescribed a very short course of pain medication for you to take AS NEEDED for severe pain. It is perfectly okay to take Tylenol if that controls you pain. This medication contains 325 mg Tylenol in it; please do not exceed daily recommended amount of Tylenol (3,000 mg).  While at home please rest, ice, and elevate your arm to reduce swelling.        Eustaquio Maize, PA-C 02/28/19 [place], MD 03/01/19 [phone]

## 2019-02-28 NOTE — Discharge Instructions (Signed)
Please follow up with orthopedist Dr. Mardelle Matte for further evaluation of your fracture Continue wearing arm sling until you are seen by orthopedics I have prescribed a very short course of pain medication for you to take AS NEEDED for severe pain. It is perfectly okay to take Tylenol if that controls you pain. This medication contains 325 mg Tylenol in it; please do not exceed daily recommended amount of Tylenol (3,000 mg).  While at home please rest, ice, and elevate your arm to reduce swelling.

## 2019-03-04 DIAGNOSIS — S42254A Nondisplaced fracture of greater tuberosity of right humerus, initial encounter for closed fracture: Secondary | ICD-10-CM | POA: Diagnosis not present

## 2019-03-11 DIAGNOSIS — S42254D Nondisplaced fracture of greater tuberosity of right humerus, subsequent encounter for fracture with routine healing: Secondary | ICD-10-CM | POA: Diagnosis not present

## 2019-03-18 ENCOUNTER — Other Ambulatory Visit: Payer: Self-pay | Admitting: Family Medicine

## 2019-03-29 ENCOUNTER — Encounter: Payer: Self-pay | Admitting: Family Medicine

## 2019-04-05 DIAGNOSIS — Z961 Presence of intraocular lens: Secondary | ICD-10-CM | POA: Diagnosis not present

## 2019-04-05 DIAGNOSIS — E7800 Pure hypercholesterolemia, unspecified: Secondary | ICD-10-CM | POA: Diagnosis not present

## 2019-04-05 DIAGNOSIS — H5200 Hypermetropia, unspecified eye: Secondary | ICD-10-CM | POA: Diagnosis not present

## 2019-04-05 DIAGNOSIS — H35363 Drusen (degenerative) of macula, bilateral: Secondary | ICD-10-CM | POA: Diagnosis not present

## 2019-04-05 DIAGNOSIS — Z0100 Encounter for examination of eyes and vision without abnormal findings: Secondary | ICD-10-CM | POA: Diagnosis not present

## 2019-04-05 DIAGNOSIS — H11153 Pinguecula, bilateral: Secondary | ICD-10-CM | POA: Diagnosis not present

## 2019-04-05 DIAGNOSIS — E119 Type 2 diabetes mellitus without complications: Secondary | ICD-10-CM | POA: Diagnosis not present

## 2019-04-11 DIAGNOSIS — S42254D Nondisplaced fracture of greater tuberosity of right humerus, subsequent encounter for fracture with routine healing: Secondary | ICD-10-CM | POA: Diagnosis not present

## 2019-05-03 DIAGNOSIS — R69 Illness, unspecified: Secondary | ICD-10-CM | POA: Diagnosis not present

## 2019-05-04 DIAGNOSIS — R69 Illness, unspecified: Secondary | ICD-10-CM | POA: Diagnosis not present

## 2019-05-15 ENCOUNTER — Other Ambulatory Visit: Payer: Self-pay | Admitting: Family Medicine

## 2019-05-16 DIAGNOSIS — S42254D Nondisplaced fracture of greater tuberosity of right humerus, subsequent encounter for fracture with routine healing: Secondary | ICD-10-CM | POA: Diagnosis not present

## 2019-05-30 ENCOUNTER — Other Ambulatory Visit: Payer: Self-pay

## 2019-05-31 ENCOUNTER — Encounter: Payer: Self-pay | Admitting: Family Medicine

## 2019-05-31 ENCOUNTER — Other Ambulatory Visit: Payer: Self-pay

## 2019-05-31 ENCOUNTER — Ambulatory Visit (INDEPENDENT_AMBULATORY_CARE_PROVIDER_SITE_OTHER): Payer: Medicare HMO | Admitting: Family Medicine

## 2019-05-31 VITALS — BP 125/82 | HR 80 | Temp 98.2°F | Resp 16 | Ht 73.5 in | Wt 242.2 lb

## 2019-05-31 DIAGNOSIS — Z1211 Encounter for screening for malignant neoplasm of colon: Secondary | ICD-10-CM | POA: Diagnosis not present

## 2019-05-31 DIAGNOSIS — R809 Proteinuria, unspecified: Secondary | ICD-10-CM | POA: Diagnosis not present

## 2019-05-31 DIAGNOSIS — Z0000 Encounter for general adult medical examination without abnormal findings: Secondary | ICD-10-CM | POA: Diagnosis not present

## 2019-05-31 DIAGNOSIS — I10 Essential (primary) hypertension: Secondary | ICD-10-CM | POA: Diagnosis not present

## 2019-05-31 DIAGNOSIS — Z125 Encounter for screening for malignant neoplasm of prostate: Secondary | ICD-10-CM

## 2019-05-31 DIAGNOSIS — E1129 Type 2 diabetes mellitus with other diabetic kidney complication: Secondary | ICD-10-CM | POA: Diagnosis not present

## 2019-05-31 DIAGNOSIS — E7800 Pure hypercholesterolemia, unspecified: Secondary | ICD-10-CM | POA: Diagnosis not present

## 2019-05-31 LAB — PSA, MEDICARE: PSA: 0.93 ng/ml (ref 0.10–4.00)

## 2019-05-31 NOTE — Patient Instructions (Signed)

## 2019-05-31 NOTE — Progress Notes (Signed)
Office Note 05/31/2019  CC:  Chief Complaint  Patient presents with  . Annual Exam    pt is fasting   HPI:  Zachary Chandler is a 69 y.o. White male with hx of HTN, DM 2, and HLD who is here for annual health maintenance exam.  Still working part time: Leisure centre manager. Exercise: walking/lifting during routine activity but no formal exercise. Diet: focusing on everything "in moderation". Checks glucose about 1 time per month: most recent fasting was 170.  Still nursing R prox humerus fracture--home exercises.  Past Medical History:  Diagnosis Date  . Basal cell carcinoma (BCC) of right temple region 03/2017   Dr. Denna Haggard  . Cataract 07/2015   R eye--surgery recommended by his eye MD 08/03/15  . Closed fracture of greater tuberosity of right humerus 02/28/2019   Immobil w/sling (Dr. Mardelle Matte)  . Colon cancer screening 02/15/2018   iFOB neg.  Repeat after 02/2019.  . Erectile dysfunction    Dr. Gaynelle Arabian.  Test[facility] 04/2016 at [facility].  . Gout of big toe 1990s   only one episode; he cut back on peanut butter and hasn't had any prob since  . History of prostatitis 1980s   Remote hx of seeing Dr. Terance Hart at [facility] urology--then Dr. Gaynelle Arabian.   Marland Kitchen Hyperlipidemia   . Hypertension dx'd 08/2013  . Kidney stones 1980s  . Microcytic anemia 06/2016  . Nodular basal cell carcinoma (BCC) 02/19/2017   right [id number]  . Thalassemia    Mild anemia with MCV 60s (alpha thal minor or beta thal trait: Hb electrophoresis not done/no result available.).  Marland Kitchen Type 2 diabetes mellitus with microalbuminuria ([place]) Dx'd 08/2013    Past Surgical History:  Procedure Laterality Date  . NASAL POLYP EXCISION  1960    Family History  Problem Relation Age of Onset  . Heart disease Mother   . Hypertension Mother   . Diabetes Mother   . Heart disease Father   . Heart attack Sister     Social History   Socioeconomic History  . Marital status: Married    Spouse name: Not on file  .  Number of children: Not on file  . Years of education: Not on file  . Highest education level: Not on file  Occupational History  . Not on file  Tobacco Use  . Smoking status: Former Smoker    Quit date: 02/28/1970    Years since quitting: 49.2  . Smokeless tobacco: Never Used  Substance and Sexual Activity  . Alcohol use: No    Alcohol/week: 0.0 standard drinks  . Drug use: No  . Sexual activity: Not on file  Other Topics Concern  . Not on file  Social History Narrative   Married, has one 64 y/o son.   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     Orig from [place].   Was in [employer] [phone].  No combat duty.  No VA care.   Smoked 10-12 yrs, quit in his early 48s.   Alcohol: none   No hx of drugs.            Social Determinants of Health   Financial Resource Strain:   . Difficulty of Paying Living Expenses:   Food Insecurity:   . Worried About Charity fundraiser in the Last Year:   . Arboriculturist in the Last Year:   Transportation Needs:   . Film/video editor (Medical):   Marland Kitchen Lack of Transportation (Non-Medical):  Physical Activity:   . Days of Exercise per Week:   . Minutes of Exercise per Session:   Stress:   . Feeling of Stress :   Social Connections:   . Frequency of Communication with Friends and Family:   . Frequency of Social Gatherings with Friends and Family:   . Attends Religious Services:   . Active Member of Clubs or Organizations:   . Attends Archivist Meetings:   Marland Kitchen Marital Status:   Intimate Partner Violence:   . Fear of Current or Ex-Partner:   . Emotionally Abused:   Marland Kitchen Physically Abused:   . Sexually Abused:     Outpatient Medications Prior to Visit  Medication Sig Dispense Refill  . amLODipine (NORVASC) 10 MG tablet TAKE 1 TABLET DAILY 90 tablet 1  . atorvastatin (LIPITOR) 20 MG tablet TAKE 1 TABLET DAILY 90 tablet 1  . cetirizine (ZYRTEC) 10 MG tablet Take 10 mg by mouth daily.    Marland Kitchen glucose blood test  strip Use as instructed 100 each 6  . hydrochlorothiazide (HYDRODIURIL) 25 MG tablet Take 1 tablet (25 mg total) by mouth daily. 90 tablet 3  . hydrocortisone valerate ointment (WEST-CORT) 0.2 % Appy bid to affected area prn 15 g 2  . Lancets (FREESTYLE) lancets Use as instructed 100 each 12  . lisinopril (ZESTRIL) 10 MG tablet Take 1 tablet (10 mg total) by mouth daily. 90 tablet 1  . metFORMIN (GLUCOPHAGE) 1000 MG tablet TAKE 1 TABLET TWICE DAILY  WITH MEALS 180 tablet 1  . pioglitazone (ACTOS) 45 MG tablet TAKE 1 TABLET DAILY 90 tablet 0  . prednisoLONE acetate (PRED FORTE) 1 % ophthalmic suspension SMARTSIG:1 Drop(s) In Eye(s) As Needed    . HYDROcodone-acetaminophen (NORCO/VICODIN) 5-325 MG tablet Take 1 tablet by mouth every 6 (six) hours as needed for severe pain. (Patient not taking: Reported on 05/31/2019) 10 tablet 0  . naproxen (NAPROSYN) 500 MG tablet Take 1 tablet (500 mg total) by mouth 2 (two) times daily with a meal. (Patient not taking: Reported on 08/09/2018) 30 tablet 0   No facility-administered medications prior to visit.    No Known Allergies  ROS Review of Systems  Constitutional: Negative for appetite change, chills, fatigue and fever.  HENT: Negative for congestion, dental problem, ear pain and sore throat.   Eyes: Negative for discharge, redness and visual disturbance.  Respiratory: Negative for cough, chest tightness, shortness of breath and wheezing.   Cardiovascular: Negative for chest pain, palpitations and leg swelling.  Gastrointestinal: Negative for abdominal pain, blood in stool, diarrhea, nausea and vomiting.  Genitourinary: Negative for difficulty urinating, dysuria, flank pain, frequency, hematuria and urgency.  Musculoskeletal: Negative for arthralgias, back pain, joint swelling, myalgias and neck stiffness.  Skin: Negative for pallor and rash.  Neurological: Negative for dizziness, speech difficulty, weakness and headaches.  Hematological: Negative for  adenopathy. Does not bruise/bleed easily.  Psychiatric/Behavioral: Negative for confusion and sleep disturbance. The patient is not nervous/anxious.     PE; Blood pressure 125/82, pulse 80, temperature 98.2 F (36.8 C), temperature source Temporal, resp. rate 16, height 6' 1.5" (1.867 m), weight 242 lb 3.2 oz (109.9 kg), SpO2 96 %. Body mass index is 31.52 kg/m.  Gen: Alert, well appearing.  Patient is oriented to person, place, time, and situation. AFFECT: pleasant, lucid thought and speech. ENT: Ears: EACs clear, normal epithelium.  TMs with good light reflex and landmarks bilaterally.  Eyes: no injection, icteris, swelling, or exudate.  EOMI, PERRLA. Nose: no  drainage or turbinate edema/swelling.  No injection or focal lesion.  Mouth: lips without lesion/swelling.  Oral mucosa pink and moist.  Dentition intact and without obvious caries or gingival swelling.  Oropharynx without erythema, exudate, or swelling.  Neck: supple/nontender.  No LAD, mass, or TM.  Carotid pulses 2+ bilaterally, without bruits. CV: RRR, no m/r/g.   LUNGS: CTA bilat, nonlabored resps, good aeration in all lung fields. ABD: soft, NT, ND, BS normal.  No hepatospenomegaly or mass.  No bruits. EXT: no clubbing, cyanosis, or edema.  Musculoskeletal: no joint swelling, erythema, warmth, or tenderness.  ROM of all joints intact. Skin - no sores or suspicious lesions or rashes or color changes Rectal: pt declined  Pertinent labs:  Lab Results  Component Value Date   TSH 0.84 07/24/2017   Lab Results  Component Value Date   WBC 7.9 02/01/2018   HGB 11.6 (L) 02/01/2018   HCT 37.0 (L) 02/01/2018   MCV 63.1 (L) 02/01/2018   PLT 302 02/01/2018   Lab Results  Component Value Date   IRON 187 (H) 07/09/2016   FERRITIN 260.1 07/09/2016    Lab Results  Component Value Date   CREATININE 1.01 02/22/2019   BUN 21 02/22/2019   NA 138 02/22/2019   K 4.3 02/22/2019   CL 102 02/22/2019   CO2 24 02/22/2019   Lab  Results  Component Value Date   ALT 14 02/22/2019   AST 17 02/22/2019   ALKPHOS 48 07/08/2016   BILITOT 1.0 02/22/2019   Lab Results  Component Value Date   CHOL 108 02/22/2019   Lab Results  Component Value Date   HDL 37 (L) 02/22/2019   Lab Results  Component Value Date   LDLCALC 48 02/22/2019   Lab Results  Component Value Date   TRIG 150 (H) 02/22/2019   Lab Results  Component Value Date   CHOLHDL 2.9 02/22/2019   Lab Results  Component Value Date   PSA 0.86 02/01/2018   PSA 0.73 01/16/2017   PSA 0.85 05/09/2015   Lab Results  Component Value Date   HGBA1C 6.8 (H) 02/22/2019   ASSESSMENT AND PLAN:   Health maintenance exam: Reviewed age and gender appropriate health maintenance issues (prudent diet, regular exercise, health risks of tobacco and excessive alcohol, use of seatbelts, fire alarms in home, use of sunscreen).  Also reviewed age and gender appropriate health screening as well as vaccine recommendations. Vaccines: Tdap and pneumovax UTD. Shingrix->pt declines. Labs: CBC, BMET, A1c, PSA (DM, HTN, HLD, pro ca scr). Prostate ca screening: DRE -->pt declined today but he does want PSA check. Colon ca screening: no hx of colonoscopy.  Had iFOB neg 02/2018.  Repeat iFOB today.  An After Visit Summary was printed and given to the patient.  FOLLOW UP:  No follow-ups on file.  Signed:  Crissie Sickles, MD           05/31/2019

## 2019-05-31 NOTE — Addendum Note (Signed)
Addended by: Ralph Dowdy on: 05/31/2019 09:42 AM   Modules accepted: Orders

## 2019-06-01 LAB — BASIC METABOLIC PANEL
BUN/Creatinine Ratio: 20 (calc) (ref 6–22)
BUN: 25 mg/dL (ref 7–25)
CO2: 24 mmol/L (ref 20–32)
Calcium: 9.7 mg/dL (ref 8.6–10.3)
Chloride: 103 mmol/L (ref 98–110)
Creat: 1.28 mg/dL — ABNORMAL HIGH (ref 0.70–1.18)
Glucose, Bld: 156 mg/dL — ABNORMAL HIGH (ref 65–99)
Potassium: 4.3 mmol/L (ref 3.5–5.3)
Sodium: 138 mmol/L (ref 135–146)

## 2019-06-01 LAB — CBC WITH DIFFERENTIAL/PLATELET
Absolute Monocytes: 573 cells/uL (ref 200–950)
Basophils Absolute: 61 cells/uL (ref 0–200)
Basophils Relative: 1 %
Eosinophils Absolute: 61 cells/uL (ref 15–500)
Eosinophils Relative: 1 %
HCT: 35.7 % — ABNORMAL LOW (ref 38.5–50.0)
Hemoglobin: 10.8 g/dL — ABNORMAL LOW (ref 13.2–17.1)
Lymphs Abs: 1226 cells/uL (ref 850–3900)
MCH: 19.3 pg — ABNORMAL LOW (ref 27.0–33.0)
MCHC: 30.3 g/dL — ABNORMAL LOW (ref 32.0–36.0)
MCV: 63.9 fL — ABNORMAL LOW (ref 80.0–100.0)
Monocytes Relative: 9.4 %
Neutro Abs: 4179 cells/uL (ref 1500–7800)
Neutrophils Relative %: 68.5 %
Platelets: 327 10*3/uL (ref 140–400)
RBC: 5.59 10*6/uL (ref 4.20–5.80)
RDW: 16.9 % — ABNORMAL HIGH (ref 11.0–15.0)
Total Lymphocyte: 20.1 %
WBC: 6.1 10*3/uL (ref 3.8–10.8)

## 2019-06-01 LAB — HEMOGLOBIN A1C
Hgb A1c MFr Bld: 6.4 % of total Hgb — ABNORMAL HIGH (ref <5.7)
Mean Plasma Glucose: 137 (calc)
eAG (mmol/L): 7.6 (calc)

## 2019-06-01 LAB — CBC MORPHOLOGY

## 2019-07-22 ENCOUNTER — Other Ambulatory Visit: Payer: Self-pay | Admitting: Family Medicine

## 2019-08-02 DIAGNOSIS — I10 Essential (primary) hypertension: Secondary | ICD-10-CM | POA: Diagnosis not present

## 2019-08-02 DIAGNOSIS — H04129 Dry eye syndrome of unspecified lacrimal gland: Secondary | ICD-10-CM | POA: Diagnosis not present

## 2019-08-02 DIAGNOSIS — Z8249 Family history of ischemic heart disease and other diseases of the circulatory system: Secondary | ICD-10-CM | POA: Diagnosis not present

## 2019-08-02 DIAGNOSIS — E11620 Type 2 diabetes mellitus with diabetic dermatitis: Secondary | ICD-10-CM | POA: Diagnosis not present

## 2019-08-02 DIAGNOSIS — E785 Hyperlipidemia, unspecified: Secondary | ICD-10-CM | POA: Diagnosis not present

## 2019-08-02 DIAGNOSIS — J302 Other seasonal allergic rhinitis: Secondary | ICD-10-CM | POA: Diagnosis not present

## 2019-08-02 DIAGNOSIS — N529 Male erectile dysfunction, unspecified: Secondary | ICD-10-CM | POA: Diagnosis not present

## 2019-08-02 DIAGNOSIS — Z7984 Long term (current) use of oral hypoglycemic drugs: Secondary | ICD-10-CM | POA: Diagnosis not present

## 2019-08-02 DIAGNOSIS — Z6831 Body mass index (BMI) 31.0-31.9, adult: Secondary | ICD-10-CM | POA: Diagnosis not present

## 2019-08-02 DIAGNOSIS — E669 Obesity, unspecified: Secondary | ICD-10-CM | POA: Diagnosis not present

## 2019-08-08 ENCOUNTER — Other Ambulatory Visit: Payer: Self-pay | Admitting: Family Medicine

## 2019-08-18 ENCOUNTER — Other Ambulatory Visit: Payer: Self-pay | Admitting: Family Medicine

## 2019-08-31 ENCOUNTER — Ambulatory Visit (INDEPENDENT_AMBULATORY_CARE_PROVIDER_SITE_OTHER): Payer: Medicare HMO | Admitting: Family Medicine

## 2019-08-31 ENCOUNTER — Encounter: Payer: Self-pay | Admitting: Family Medicine

## 2019-08-31 ENCOUNTER — Other Ambulatory Visit: Payer: Self-pay

## 2019-08-31 VITALS — BP 133/82 | HR 74 | Temp 98.0°F | Resp 16 | Ht 73.5 in | Wt 241.0 lb

## 2019-08-31 DIAGNOSIS — R809 Proteinuria, unspecified: Secondary | ICD-10-CM | POA: Diagnosis not present

## 2019-08-31 DIAGNOSIS — R7989 Other specified abnormal findings of blood chemistry: Secondary | ICD-10-CM | POA: Diagnosis not present

## 2019-08-31 DIAGNOSIS — I10 Essential (primary) hypertension: Secondary | ICD-10-CM

## 2019-08-31 DIAGNOSIS — E1129 Type 2 diabetes mellitus with other diabetic kidney complication: Secondary | ICD-10-CM | POA: Diagnosis not present

## 2019-08-31 MED ORDER — HYDROCORTISONE VALERATE 0.2 % EX OINT: TOPICAL_OINTMENT | CUTANEOUS | 2 refills | Status: DC

## 2019-08-31 NOTE — Progress Notes (Signed)
OFFICE VISIT  08/31/2019   CC:  Chief Complaint  Patient presents with  . Follow-up    RCI, pt is fasting   HPI:    Patient is a 69 y.o. Caucasian male who presents for 3 mo f/u DM, HTN.  Some intermittent nasal cong/sinus pressure and burning.  Zyrtec has helped. He is not interested in taking any nasal spray.  No home bp monitoring.  Compliant with all meds.  Dm: rare home gluc check, couple wks ago fasting 149.  Life very busy. Has been trying to improve water/fluids intake but finds that it is hard b/c not thirsty much.  Propel and water.  Sprite and Dr. Rita Ohara. Eating fine.  ROS: no fevers, no CP, no SOB, no wheezing, no cough, no dizziness, no HAs, no rashes, no melena/hematochezia.  No polyuria or polydipsia.  No myalgias or arthralgias.  No focal weakness, paresthesias, or tremors.  No acute vision or hearing abnormalities. No n/v/d or abd pain.  No palpitations.     Past Medical History:  Diagnosis Date  . Basal cell carcinoma (BCC) of right temple region 03/2017   Dr. Denna Haggard  . Cataract 07/2015   R eye--surgery recommended by his eye MD 08/03/15  . Closed fracture of greater tuberosity of right humerus 02/28/2019   Immobil w/sling (Dr. Mardelle Matte)  . Colon cancer screening 02/15/2018   iFOB neg.  Repeat after 02/2019.  . Erectile dysfunction    Dr. Gaynelle Arabian.  Testosterone normal 04/2016 at [facility].  . Gout of big toe 1990s   only one episode; he cut back on peanut butter and hasn't had any prob since  . Herpes simplex ophthalmicus    L eye, has opthalmologist, uses prednisone drops prn  . History of prostatitis 1980s   Remote hx of seeing Dr. Terance Hart at [facility]--then Dr. Gaynelle Arabian.   Marland Kitchen Hyperlipidemia   . Hypertension dx'd 08/2013  . Kidney stones 1980s  . Microcytic anemia 06/2016  . Nodular basal cell carcinoma (BCC) 02/19/2017   right [id number]  . Shingles    recurrent L thorax  . Thalassemia    Mild anemia with MCV 60s (alpha thal minor  or beta thal trait: Hb electrophoresis not done/no result available.).  Marland Kitchen Type 2 diabetes mellitus with microalbuminuria ([place]) Dx'd 08/2013    Past Surgical History:  Procedure Laterality Date  . NASAL POLYP EXCISION  1960    Outpatient Medications Prior to Visit  Medication Sig Dispense Refill  . amLODipine (NORVASC) 10 MG tablet TAKE 1 TABLET DAILY 90 tablet 1  . atorvastatin (LIPITOR) 20 MG tablet TAKE 1 TABLET DAILY 90 tablet 1  . cetirizine (ZYRTEC) 10 MG tablet Take 10 mg by mouth daily.    Marland Kitchen glucose blood test strip Use as instructed 100 each 6  . hydrochlorothiazide (HYDRODIURIL) 25 MG tablet TAKE 1 TABLET DAILY 90 tablet 1  . Lancets (FREESTYLE) lancets Use as instructed 100 each 12  . lisinopril (ZESTRIL) 10 MG tablet TAKE 1 TABLET BY MOUTH EVERY DAY 90 tablet 1  . metFORMIN (GLUCOPHAGE) 1000 MG tablet TAKE 1 TABLET TWICE DAILY  WITH MEALS 180 tablet 1  . pioglitazone (ACTOS) 45 MG tablet TAKE 1 TABLET DAILY 90 tablet 0  . prednisoLONE acetate (PRED FORTE) 1 % ophthalmic suspension SMARTSIG:1 Drop(s) In Eye(s) As Needed    . hydrocortisone valerate ointment (WEST-CORT) 0.2 % Appy bid to affected area prn 15 g 2   No facility-administered medications prior to visit.   No Known Allergies  ROS As per HPI  PE: Vitals with BMI 08/31/2019 05/31/2019 02/28/2019  Height 6' 1.5" 6' 1.5" -  Weight 241 lbs 242 lbs 3 oz -  BMI 71.16 57.90 -  Systolic 383 338 329  Diastolic 82 82 81  Pulse 74 80 77  O2 sat on RA today is 98%  Gen: Alert, well appearing.  Patient is oriented to person, place, time, and situation. AFFECT: pleasant, lucid thought and speech. CV: RRR, no m/r/g.   LUNGS: CTA bilat, nonlabored resps, good aeration in all lung fields. EXT: no clubbing or cyanosis.  no edema.    LABS:  Lab Results  Component Value Date   TSH 0.84 07/24/2017   Lab Results  Component Value Date   WBC 6.1 05/31/2019   HGB 10.8 (L) 05/31/2019   HCT 35.7 (L) 05/31/2019   MCV 63.9  (L) 05/31/2019   PLT 327 05/31/2019   Lab Results  Component Value Date   CREATININE 1.28 (H) 05/31/2019   BUN 25 05/31/2019   NA 138 05/31/2019   K 4.3 05/31/2019   CL 103 05/31/2019   CO2 24 05/31/2019   Lab Results  Component Value Date   ALT 14 02/22/2019   AST 17 02/22/2019   ALKPHOS 48 07/08/2016   BILITOT 1.0 02/22/2019   Lab Results  Component Value Date   CHOL 108 02/22/2019   Lab Results  Component Value Date   HDL 37 (L) 02/22/2019   Lab Results  Component Value Date   LDLCALC 48 02/22/2019   Lab Results  Component Value Date   TRIG 150 (H) 02/22/2019   Lab Results  Component Value Date   CHOLHDL 2.9 02/22/2019   Lab Results  Component Value Date   PSA 0.93 05/31/2019   PSA 0.86 02/01/2018   PSA 0.73 01/16/2017   Lab Results  Component Value Date   HGBA1C 6.4 (H) 05/31/2019   IMPRESSION AND PLAN:  1) HTN: The current medical regimen is effective;  continue present plan and medications. Lytes/cr today.  2) DM 2: A1c's have been good. Watching sCr close and if GFR <45 will get him off metformin.  3) Elevated sCr: last check up from 1 to 1.28. Tried to improve fluid intake.  He avoids NSAIDs. Recheck sCr today.  An After Visit Summary was printed and given to the patient.  FOLLOW UP: Return in about 3 months (around 12/01/2019) for routine chronic illness f/u.  Signed:  Crissie Sickles, MD           08/31/2019

## 2019-09-01 LAB — COMPREHENSIVE METABOLIC PANEL
AG Ratio: 2.2 (calc) (ref 1.0–2.5)
ALT: 14 U/L (ref 9–46)
AST: 17 U/L (ref 10–35)
Albumin: 4.4 g/dL (ref 3.6–5.1)
Alkaline phosphatase (APISO): 60 U/L (ref 35–144)
BUN/Creatinine Ratio: 19 (calc) (ref 6–22)
BUN: 24 mg/dL (ref 7–25)
CO2: 23 mmol/L (ref 20–32)
Calcium: 9.1 mg/dL (ref 8.6–10.3)
Chloride: 105 mmol/L (ref 98–110)
Creat: 1.27 mg/dL — ABNORMAL HIGH (ref 0.70–1.18)
Globulin: 2 g/dL (calc) (ref 1.9–3.7)
Glucose, Bld: 150 mg/dL — ABNORMAL HIGH (ref 65–99)
Potassium: 4.4 mmol/L (ref 3.5–5.3)
Sodium: 141 mmol/L (ref 135–146)
Total Bilirubin: 1.1 mg/dL (ref 0.2–1.2)
Total Protein: 6.4 g/dL (ref 6.1–8.1)

## 2019-09-01 LAB — HEMOGLOBIN A1C
Hgb A1c MFr Bld: 6.3 % of total Hgb — ABNORMAL HIGH (ref <5.7)
Mean Plasma Glucose: 134 (calc)
eAG (mmol/L): 7.4 (calc)

## 2019-09-07 ENCOUNTER — Other Ambulatory Visit: Payer: Self-pay

## 2019-09-07 ENCOUNTER — Other Ambulatory Visit: Payer: Self-pay | Admitting: Family Medicine

## 2019-09-07 MED ORDER — METFORMIN HCL 1000 MG PO TABS: 1000.0000 mg | ORAL_TABLET | Freq: Two times a day (BID) | ORAL | 1 refills | Status: DC

## 2019-11-06 ENCOUNTER — Other Ambulatory Visit: Payer: Self-pay | Admitting: Family Medicine

## 2019-11-26 DIAGNOSIS — J019 Acute sinusitis, unspecified: Secondary | ICD-10-CM | POA: Diagnosis not present

## 2019-12-06 ENCOUNTER — Encounter: Payer: Self-pay | Admitting: Family Medicine

## 2019-12-06 ENCOUNTER — Other Ambulatory Visit: Payer: Self-pay

## 2019-12-06 ENCOUNTER — Ambulatory Visit (INDEPENDENT_AMBULATORY_CARE_PROVIDER_SITE_OTHER): Payer: Medicare HMO | Admitting: Family Medicine

## 2019-12-06 VITALS — BP 120/72 | HR 94 | Temp 97.6°F | Resp 16 | Ht 73.5 in | Wt 241.8 lb

## 2019-12-06 DIAGNOSIS — R5382 Chronic fatigue, unspecified: Secondary | ICD-10-CM | POA: Diagnosis not present

## 2019-12-06 DIAGNOSIS — R110 Nausea: Secondary | ICD-10-CM

## 2019-12-06 DIAGNOSIS — R809 Proteinuria, unspecified: Secondary | ICD-10-CM | POA: Diagnosis not present

## 2019-12-06 DIAGNOSIS — R7989 Other specified abnormal findings of blood chemistry: Secondary | ICD-10-CM

## 2019-12-06 DIAGNOSIS — R1011 Right upper quadrant pain: Secondary | ICD-10-CM

## 2019-12-06 DIAGNOSIS — E1129 Type 2 diabetes mellitus with other diabetic kidney complication: Secondary | ICD-10-CM | POA: Diagnosis not present

## 2019-12-06 DIAGNOSIS — I10 Essential (primary) hypertension: Secondary | ICD-10-CM

## 2019-12-06 DIAGNOSIS — E7800 Pure hypercholesterolemia, unspecified: Secondary | ICD-10-CM

## 2019-12-06 NOTE — Progress Notes (Signed)
OFFICE VISIT  12/06/2019  CC:  Chief Complaint  Patient presents with  . Follow-up    RCI, pt is fasting    HPI:    Patient is a 69 y.o. Caucasian male who presents for 3 mo f/u DM 2 with microalbuminuria, HTN, and elevated sCr. A/P as of last visit: "1) HTN: The current medical regimen is effective;  continue present plan and medications. Lytes/cr today.  2) DM 2: A1c's have been good. Watching sCr close and if GFR <45 will get him off metformin.  3) Elevated sCr: last check up from 1 to 1.28. Tried to improve fluid intake.  He avoids NSAIDs. Recheck sCr today."  INTERIM HX: Creatinine 1.27 last recheck 3 mo ago, which is still up some from previous baseline of about 1. I did not make any med changes at that time.  He is not feeling very well lately: sounds pretty nonspecific.   He still works but doesn't exercise. Chronic fatigue, a little worse lately, some HAs, some mild nausea, some orthostatic dizziness. [facility] urgent care visit recently for the HAs and nasal burning and some R "sinus pressure", some PND.  NO cough or SOB.  Dx'd with sinusitis and put on augmentin.  Feels a little better. Says hx of "chronic sinus".  Also gives hx of Intermittent RUQ pain, mild, lasts 62min or so, no trigger known (not necessarily postprandial), no fevers, no vomiting. Intermittent periods of nausea, not particularly related to the side pain.  Seems to always be there a little, waxes and wanes. Says he was dx'd with gallstones few years ago: [facility], says never signif symptomatic.  Was told by surgeon at some point in remote past that his GB did not need to be removed.  DM: "pretty normal" sugars. HTN: "mostly normal". Elev sCr:  Takes ibup 600mg  dose every other day.  Tylenol other days.  ROS: no fevers, no CP, no SOB, no wheezing, no rashes, no melena/hematochezia.  No polyuria or polydipsia.  No myalgias or arthralgias.  No focal weakness, paresthesias, or tremors.  No acute  vision or hearing abnormalities. No diarrhea.  No palpitations.     Past Medical History:  Diagnosis Date  . Basal cell carcinoma (BCC) of right temple region 03/2017   Dr. Denna Haggard  . Cataract 07/2015   R eye--surgery recommended by his eye MD 08/03/15  . Closed fracture of greater tuberosity of right humerus 02/28/2019   Immobil w/sling (Dr. Mardelle Matte)  . Colon cancer screening 02/15/2018   iFOB neg.  Repeat after 02/2019.  . Erectile dysfunction    Dr. Gaynelle Arabian.  Testosterone normal 04/2016 at [facility].  . Gout of big toe 1990s   only one episode; he cut back on peanut butter and hasn't had any prob since  . Herpes simplex ophthalmicus    L eye, has opthalmologist, uses prednisone drops prn  . History of prostatitis 1980s   Remote hx of seeing Dr. Terance Hart at [facility] urology--then Dr. Gaynelle Arabian.   Marland Kitchen Hyperlipidemia   . Hypertension dx'd 08/2013  . Kidney stones 1980s  . Microcytic anemia 06/2016  . Nodular basal cell carcinoma (BCC) 02/19/2017   right temple-[id number]  . Shingles    recurrent L thorax  . Thalassemia    Mild anemia with MCV 60s (alpha thal minor or beta thal trait: Hb electrophoresis not done/no result available.).  Marland Kitchen Type 2 diabetes mellitus with microalbuminuria ([place]) Dx'd 08/2013    Past Surgical History:  Procedure Laterality Date  . NASAL POLYP  EXCISION  1960    Outpatient Medications Prior to Visit  Medication Sig Dispense Refill  . amLODipine (NORVASC) 10 MG tablet TAKE 1 TABLET DAILY 90 tablet 1  . amoxicillin-clavulanate (AUGMENTIN) 875-125 MG tablet Take 1 tablet by mouth 2 (two) times daily.    Marland Kitchen atorvastatin (LIPITOR) 20 MG tablet TAKE 1 TABLET DAILY 90 tablet 1  . cetirizine (ZYRTEC) 10 MG tablet Take 10 mg by mouth daily.    Marland Kitchen glucose blood test strip Use as instructed 100 each 6  . hydrochlorothiazide (HYDRODIURIL) 25 MG tablet TAKE 1 TABLET DAILY 90 tablet 1  . hydrocortisone valerate ointment (WEST-CORT) 0.2 % Appy bid to affected area prn 15 g 2   . Lancets (FREESTYLE) lancets Use as instructed 100 each 12  . lisinopril (ZESTRIL) 10 MG tablet TAKE 1 TABLET BY MOUTH EVERY DAY 90 tablet 1  . metFORMIN (GLUCOPHAGE) 1000 MG tablet Take 1 tablet (1,000 mg total) by mouth 2 (two) times daily with a meal. 180 tablet 1  . pioglitazone (ACTOS) 45 MG tablet TAKE 1 TABLET DAILY 90 tablet 0  . prednisoLONE acetate (PRED FORTE) 1 % ophthalmic suspension SMARTSIG:1 Drop(s) In Eye(s) As Needed     No facility-administered medications prior to visit.    No Known Allergies  ROS As per HPI  PE: Vitals with BMI 12/06/2019 08/31/2019 05/31/2019  Height 6' 1.5" 6' 1.5" 6' 1.5"  Weight 241 lbs 13 oz 241 lbs 242 lbs 3 oz  BMI 31.47 16.10 96.04  Systolic 540 981 191  Diastolic 72 82 82  Pulse 94 74 80   Gen: Alert, well appearing.  Patient is oriented to person, place, time, and situation. AFFECT: pleasant, lucid thought and speech. YNW:GNFA: no injection, icteris, swelling, or exudate.  EOMI, PERRLA. Mouth: lips without lesion/swelling.  Oral mucosa pink and moist. Oropharynx without erythema, exudate, or swelling.  CV: RRR, no m/r/g.   LUNGS: CTA bilat, nonlabored resps, good aeration in all lung fields. ABD: soft, NT, ND, BS normal.  No hepatospenomegaly or mass.  No bruits. EXT: no clubbing or cyanosis.  no edema.  No pallor or jaundice.  LABS:    Chemistry      Component Value Date/Time   NA 141 08/31/2019 0826   K 4.4 08/31/2019 0826   CL 105 08/31/2019 0826   CO2 23 08/31/2019 0826   BUN 24 08/31/2019 0826   CREATININE 1.27 (H) 08/31/2019 0826      Component Value Date/Time   CALCIUM 9.1 08/31/2019 0826   ALKPHOS 48 07/08/2016 0846   AST 17 08/31/2019 0826   ALT 14 08/31/2019 0826   BILITOT 1.1 08/31/2019 0826     Lab Results  Component Value Date   WBC 6.1 05/31/2019   HGB 10.8 (L) 05/31/2019   HCT 35.7 (L) 05/31/2019   MCV 63.9 (L) 05/31/2019   PLT 327 05/31/2019   Lab Results  Component Value Date   CHOL 108  02/22/2019   HDL 37 (L) 02/22/2019   LDLCALC 48 02/22/2019   TRIG 150 (H) 02/22/2019   CHOLHDL 2.9 02/22/2019   Lab Results  Component Value Date   HGBA1C 6.3 (H) 08/31/2019   Lab Results  Component Value Date   PSA 0.93 05/31/2019   PSA 0.86 02/01/2018   PSA 0.73 01/16/2017   IMPRESSION AND PLAN:  1) Chronic nonspecific sx's: fatigue, RUQ pain, nausea->not all necessarily related. Remote hx of gallstones per pt report. Obtain CBC w/diff and CMET+ RUQ u/s.  2) DM:  stable. Hba1c, lytes/cr today.  3) HTN stable. Cont hctz 25mg  qd, amlod 10mg  qd, lisin 10mg  qd Lytes/cr today.    4) Hx of elevated sCr, possible new baseline--recheck BMET today.  Stop NSAIDS!  5) HLD: tolerating statin.  Next lipid panel check in 3 mo.  6) Recurrent sinusitis (?): long hx of at least allergic rhinitis sx's/sinus pressure. Recent augmentin for this has helped some.  An After Visit Summary was printed and given to the patient.  FOLLOW UP: Return in about 3 months (around 03/07/2020) for routine chronic illness f/u.  Signed:  Crissie Sickles, MD           12/06/2019

## 2019-12-07 LAB — HEMOGLOBIN A1C
Hgb A1c MFr Bld: 6.6 % of total Hgb — ABNORMAL HIGH (ref <5.7)
Mean Plasma Glucose: 143 (calc)
eAG (mmol/L): 7.9 (calc)

## 2019-12-07 LAB — COMPREHENSIVE METABOLIC PANEL
AG Ratio: 1.5 (calc) (ref 1.0–2.5)
ALT: 9 U/L (ref 9–46)
AST: 7 U/L — ABNORMAL LOW (ref 10–35)
Albumin: 4.1 g/dL (ref 3.6–5.1)
Alkaline phosphatase (APISO): 62 U/L (ref 35–144)
BUN: 17 mg/dL (ref 7–25)
CO2: 22 mmol/L (ref 20–32)
Calcium: 9.3 mg/dL (ref 8.6–10.3)
Chloride: 104 mmol/L (ref 98–110)
Creat: 1.14 mg/dL (ref 0.70–1.18)
Globulin: 2.8 g/dL (calc) (ref 1.9–3.7)
Glucose, Bld: 157 mg/dL — ABNORMAL HIGH (ref 65–99)
Potassium: 4.2 mmol/L (ref 3.5–5.3)
Sodium: 138 mmol/L (ref 135–146)
Total Bilirubin: 1 mg/dL (ref 0.2–1.2)
Total Protein: 6.9 g/dL (ref 6.1–8.1)

## 2019-12-07 LAB — CBC WITH DIFFERENTIAL/PLATELET
Absolute Monocytes: 699 cells/uL (ref 200–950)
Basophils Absolute: 63 cells/uL (ref 0–200)
Basophils Relative: 1 %
Eosinophils Absolute: 107 cells/uL (ref 15–500)
Eosinophils Relative: 1.7 %
HCT: 36.4 % — ABNORMAL LOW (ref 38.5–50.0)
Hemoglobin: 10.9 g/dL — ABNORMAL LOW (ref 13.2–17.1)
Lymphs Abs: 1052 cells/uL (ref 850–3900)
MCH: 19.1 pg — ABNORMAL LOW (ref 27.0–33.0)
MCHC: 29.9 g/dL — ABNORMAL LOW (ref 32.0–36.0)
MCV: 63.7 fL — ABNORMAL LOW (ref 80.0–100.0)
Monocytes Relative: 11.1 %
Neutro Abs: 4379 cells/uL (ref 1500–7800)
Neutrophils Relative %: 69.5 %
Platelets: 283 10*3/uL (ref 140–400)
RBC: 5.71 10*6/uL (ref 4.20–5.80)
RDW: 16 % — ABNORMAL HIGH (ref 11.0–15.0)
Total Lymphocyte: 16.7 %
WBC: 6.3 10*3/uL (ref 3.8–10.8)

## 2019-12-07 LAB — CBC MORPHOLOGY

## 2019-12-13 ENCOUNTER — Ambulatory Visit (HOSPITAL_BASED_OUTPATIENT_CLINIC_OR_DEPARTMENT_OTHER)
Admission: RE | Admit: 2019-12-13 | Discharge: 2019-12-13 | Disposition: A | Discharge status: Home / Self Care | Payer: Medicare HMO | Source: Ambulatory Visit | Attending: Family Medicine | Admitting: Family Medicine

## 2019-12-13 ENCOUNTER — Other Ambulatory Visit: Payer: Self-pay

## 2019-12-13 DIAGNOSIS — R1011 Right upper quadrant pain: Secondary | ICD-10-CM | POA: Diagnosis not present

## 2019-12-13 DIAGNOSIS — R110 Nausea: Secondary | ICD-10-CM | POA: Insufficient documentation

## 2019-12-13 DIAGNOSIS — K8020 Calculus of gallbladder without cholecystitis without obstruction: Secondary | ICD-10-CM | POA: Diagnosis not present

## 2019-12-15 ENCOUNTER — Encounter: Payer: Self-pay | Admitting: Family Medicine

## 2019-12-15 ENCOUNTER — Other Ambulatory Visit: Payer: Self-pay

## 2019-12-15 DIAGNOSIS — R1011 Right upper quadrant pain: Secondary | ICD-10-CM

## 2019-12-15 DIAGNOSIS — R110 Nausea: Secondary | ICD-10-CM

## 2019-12-15 DIAGNOSIS — K8011 Calculus of gallbladder with chronic cholecystitis with obstruction: Secondary | ICD-10-CM

## 2019-12-15 DIAGNOSIS — K8020 Calculus of gallbladder without cholecystitis without obstruction: Secondary | ICD-10-CM

## 2020-01-11 ENCOUNTER — Other Ambulatory Visit: Payer: Self-pay | Admitting: Family Medicine

## 2020-01-16 ENCOUNTER — Other Ambulatory Visit: Payer: Self-pay | Admitting: Surgery

## 2020-01-16 DIAGNOSIS — K8020 Calculus of gallbladder without cholecystitis without obstruction: Secondary | ICD-10-CM | POA: Diagnosis not present

## 2020-01-23 ENCOUNTER — Other Ambulatory Visit: Payer: Self-pay

## 2020-01-23 ENCOUNTER — Encounter (HOSPITAL_COMMUNITY): Payer: Self-pay | Admitting: Surgery

## 2020-01-23 NOTE — Patient Instructions (Signed)
DUE TO COVID-19 ONLY ONE VISITOR IS ALLOWED TO COME WITH YOU AND STAY IN THE WAITING ROOM ONLY DURING PRE OP AND PROCEDURE.    COVID SWAB TESTING COMPLETED ON: Tuesday, Dec. 14, 2021 at 9:10 AM   [address] [place], [place] [address]  (Must self quarantine after testing. Follow instructions on handout.)       Your procedure is scheduled on: Thursday, Dec. 16, 2021   Report to [facility] Main  Entrance    Report to admitting at 1:30 PM   Call this number if you have problems the morning of surgery [phone]   Do not eat food :After Midnight.   May have liquids until  12:30PM  day of surgery  CLEAR LIQUID DIET  Foods Allowed                                                                     Foods Excluded  Water, Black Coffee and tea, regular and decaf                             liquids that you cannot  Plain Jell-O in any flavor  (No red)                                           see through such as: Fruit ices (not with fruit pulp)                                     milk, soups, orange juice              Iced Popsicles (No red)                                    All solid food                                   Apple juices Sports drinks like Gatorade (No red) Lightly seasoned clear broth or consume(fat free) Sugar, honey syrup  Sample Menu Breakfast                                Lunch                                     Supper Cranberry juice                    Beef broth                            Chicken broth Jell-O  Grape juice                           Apple juice Coffee or tea                        Jell-O                                      Popsicle                                                Coffee or tea                        Coffee or tea      Complete one G2 drink the morning of surgery at 12:30PM      the day of surgery.   Oral Hygiene is also important to reduce your risk of infection.                                     Remember - BRUSH YOUR TEETH THE MORNING OF SURGERY WITH YOUR REGULAR TOOTHPASTE   Do NOT smoke after Midnight   Take these medicines the morning of surgery with A SIP OF WATER: None  DO NOT TAKE ANY ORAL DIABETIC MEDICATIONS DAY OF YOUR SURGERY                               You may not have any metal on your body including jewelry, and body piercings             Do not wear lotions, powders, perfumes/cologne, or deodorant                           Men may shave face and neck.   Do not bring valuables to the hospital. [place].   Contacts, dentures or bridgework may not be worn into surgery.    Patients discharged the day of surgery will not be allowed to drive home.   Special Instructions: Bring a copy of your healthcare power of attorney and living will documents         the day of surgery if you haven't scanned them in before.              Please read over the following fact sheets you were given: IF YOU HAVE QUESTIONS ABOUT YOUR PRE OP INSTRUCTIONS PLEASE CALL [phone]   [place] - Preparing for Surgery Before surgery, you can play an important role.  Because skin is not sterile, your skin needs to be as free of germs as possible.  You can reduce the number of germs on your skin by washing with CHG (chlorahexidine gluconate) soap before surgery.  CHG is an antiseptic cleaner which kills germs and bonds with the skin to continue killing germs even after washing. Please DO NOT use if you have an allergy to  CHG or antibacterial soaps.  If your skin becomes reddened/irritated stop using the CHG and inform your nurse when you arrive at Short Stay. Do not shave (including legs and underarms) for at least 48 hours prior to the first CHG shower.  You may shave your face/neck.  Please follow these instructions carefully:  1.  Shower with CHG Soap the night before surgery and the  morning of surgery.  2.  If you  choose to wash your hair, wash your hair first as usual with your normal  shampoo.  3.  After you shampoo, rinse your hair and body thoroughly to remove the shampoo.                             4.  Use CHG as you would any other liquid soap.  You can apply chg directly to the skin and wash.  Gently with a scrungie or clean washcloth.  5.  Apply the CHG Soap to your body ONLY FROM THE NECK DOWN.   Do   not use on face/ open                           Wound or open sores. Avoid contact with eyes, ears mouth and   genitals (private parts).                       Wash face,  Genitals (private parts) with your normal soap.             6.  Wash thoroughly, paying special attention to the area where your    surgery  will be performed.  7.  Thoroughly rinse your body with warm water from the neck down.  8.  DO NOT shower/wash with your normal soap after using and rinsing off the CHG Soap.                9.  Pat yourself dry with a clean towel.            10.  Wear clean pajamas.            11.  Place clean sheets on your bed the night of your first shower and do not  sleep with pets. Day of Surgery : Do not apply any lotions/deodorants the morning of surgery.  Please wear clean clothes to the hospital/surgery center.  FAILURE TO FOLLOW THESE INSTRUCTIONS MAY RESULT IN THE CANCELLATION OF YOUR SURGERY  PATIENT SIGNATURE_________________________________  NURSE SIGNATURE__________________________________  ________________________________________________________________________

## 2020-01-23 NOTE — Progress Notes (Signed)
COVID Vaccine Completed: Yes Date COVID Vaccine completed: 11/01/19, 11/22/19 COVID vaccine manufacturer: [employer]      PCP - Tammi Sou, MD last office visit 12/06/19 in epic Cardiologist - N/A  Chest x-ray - N/A EKG -  Stress Test -N/A  ECHO - N/A Cardiac Cath - N/A Pacemaker/ICD device last checked:N/A  Sleep Study - N/A CPAP - N/A  Fasting Blood Sugar - N/A Checks Blood Sugar __N/A___ times a day Hbg A1c 6.6 12/06/19 in epic  Blood Thinner Instructions: N/A Aspirin Instructions: N/A Last Dose: N/A  Activity level: Can go up a flight of stairs without stopping and without symptoms        Anesthesia review: N/A  Patient denies shortness of breath, fever, cough and chest pain at PAT appointment   Patient verbalized understanding of instructions that were given to them at the PAT appointment. Patient was also instructed that they will need to review over the PAT instructions again at home before surgery.

## 2020-01-24 ENCOUNTER — Other Ambulatory Visit (HOSPITAL_COMMUNITY)
Admission: RE | Admit: 2020-01-24 | Discharge: 2020-01-24 | Disposition: A | Discharge status: Home / Self Care | Payer: Medicare HMO | Source: Ambulatory Visit | Attending: Surgery | Admitting: Surgery

## 2020-01-24 ENCOUNTER — Encounter (HOSPITAL_COMMUNITY)
Admission: RE | Admit: 2020-01-24 | Discharge: 2020-01-24 | Disposition: A | Discharge status: Home / Self Care | Payer: Medicare HMO | Source: Ambulatory Visit | Attending: Surgery | Admitting: Surgery

## 2020-01-24 ENCOUNTER — Other Ambulatory Visit: Payer: Self-pay

## 2020-01-24 DIAGNOSIS — Z20822 Contact with and (suspected) exposure to covid-19: Secondary | ICD-10-CM | POA: Insufficient documentation

## 2020-01-24 DIAGNOSIS — Z01812 Encounter for preprocedural laboratory examination: Secondary | ICD-10-CM | POA: Insufficient documentation

## 2020-01-24 DIAGNOSIS — Z01818 Encounter for other preprocedural examination: Secondary | ICD-10-CM | POA: Insufficient documentation

## 2020-01-24 LAB — BASIC METABOLIC PANEL
Anion gap: 13 (ref 5–15)
BUN: 18 mg/dL (ref 8–23)
CO2: 23 mmol/L (ref 22–32)
Calcium: 9.2 mg/dL (ref 8.9–10.3)
Chloride: 101 mmol/L (ref 98–111)
Creatinine, Ser: 1.17 mg/dL (ref 0.61–1.24)
GFR, Estimated: >60 mL/min (ref >60)
Glucose, Bld: 163 mg/dL — ABNORMAL HIGH (ref 70–99)
Potassium: 4 mmol/L (ref 3.5–5.1)
Sodium: 137 mmol/L (ref 135–145)

## 2020-01-24 LAB — CBC
HCT: 37 % — ABNORMAL LOW (ref 39.0–52.0)
Hemoglobin: 11.2 g/dL — ABNORMAL LOW (ref 13.0–17.0)
MCH: 19.3 pg — ABNORMAL LOW (ref 26.0–34.0)
MCHC: 30.3 g/dL (ref 30.0–36.0)
MCV: 63.7 fL — ABNORMAL LOW (ref 80.0–100.0)
Platelets: 239 10*3/uL (ref 150–400)
RBC: 5.81 MIL/uL (ref 4.22–5.81)
RDW: 17 % — ABNORMAL HIGH (ref 11.5–15.5)
WBC: 5.5 10*3/uL (ref 4.0–10.5)
nRBC: 0 % (ref 0.0–0.2)

## 2020-01-24 LAB — SARS CORONAVIRUS 2 (TAT 6-24 HRS): SARS Coronavirus 2: NEGATIVE

## 2020-01-24 LAB — GLUCOSE, CAPILLARY: Glucose-Capillary: 162 mg/dL — ABNORMAL HIGH (ref 70–99)

## 2020-01-25 NOTE — H&P (Signed)
Zachary Chandler Appointment: 01/16/2020 1:50 PM Location: [facility] Patient #: [id number] DOB: 10-22-1950 Married / Language: Zachary Chandler / Race: White Male   History of Present Illness (Stony Stegmann A. Ninfa Linden MD; 01/16/2020 2:12 PM) The patient is a 69 year old male who presents for evaluation of gall stones.  Chief complaint: Right upper quadrant abdominal pain  This is a 69 year old gentleman referred here for symptomatic gallstones. He has had known gallstones since the 1990s but is only recently become symptomatic this year. He has been having mostly nausea and diarrhea after fatty meals but is recently been having right upper quadrant abdominal pain as well. The pain is mild to moderate sharp discomfort in the right upper quadrant. He has no emesis. He denies jaundice. He is otherwise without complaints.   Past Surgical History Zachary Chandler, [place]; 01/16/2020 1:45 PM) Oral Surgery   Diagnostic Studies History [name] Zachary Chandler, [place]; 01/16/2020 1:45 PM) Colonoscopy  never  Allergies (Zachary Chandler, CMA; 01/16/2020 1:45 PM) No Known Drug Allergies  [01/16/2020]: Allergies Reconciled   Medication History (Zachary Chandler, CMA; 01/16/2020 1:46 PM) amLODIPine Besylate (10MG  Tablet, Oral) Active. hydroCHLOROthiazide (25MG  Tablet, Oral) Active. Pioglitazone HCl (45MG  Tablet, Oral) Active. Atorvastatin Calcium (20MG  Tablet, Oral) Active. metFORMIN HCl (1000MG  Tablet, Oral) Active. Lisinopril (10MG  Tablet, Oral) Active. ZyrTEC (Oral) Specific strength unknown - Active. Medications Reconciled  Social History Zachary Chandler, CMA; 01/16/2020 1:45 PM) Caffeine use  Coffee. No alcohol use  No drug use  Tobacco use  Former smoker.  Family History (Zachary Chandler, Zachary Chandler; 01/16/2020 1:45 PM) Arthritis  Mother. Diabetes Mellitus  Mother. Heart disease in male family member before age 58  Hypertension  Mother. Kidney Disease  Sister.  Other Problems (Zachary Chandler,  CMA; 01/16/2020 1:45 PM) Cholelithiasis  Diabetes Mellitus  Hemorrhoids  High blood pressure     Review of Systems (Zachary Nolan CMA; 01/16/2020 1:45 PM) General Present- Fatigue. Not Present- Appetite Loss, Chills, Fever, Night Sweats, Weight Gain and Weight Loss. Skin Present- Dryness. Not Present- Change in Wart/Mole, Hives, Jaundice, New Lesions, Non-Healing Wounds, Rash and Ulcer. HEENT Present- Sinus Pain and Wears glasses/contact lenses. Not Present- Earache, Hearing Loss, Hoarseness, Nose Bleed, Oral Ulcers, Ringing in the Ears, Seasonal Allergies, Sore Throat, Visual Disturbances and Yellow Eyes. Respiratory Present- Chronic Cough. Not Present- Bloody sputum, Difficulty Breathing, Snoring and Wheezing. Breast Not Present- Breast Mass, Breast Pain, Nipple Discharge and Skin Changes. Cardiovascular Not Present- Chest Pain, Difficulty Breathing Lying Down, Leg Cramps, Palpitations, Rapid Heart Rate, Shortness of Breath and Swelling of Extremities. Gastrointestinal Present- Chronic diarrhea, Hemorrhoids and Nausea. Not Present- Abdominal Pain, Bloating, Bloody Stool, Change in Bowel Habits, Constipation, Difficulty Swallowing, Excessive gas, Gets full quickly at meals, Indigestion, Rectal Pain and Vomiting. Male Genitourinary Present- Urine Leakage. Not Present- Blood in Urine, Change in Urinary Stream, Frequency, Impotence, Nocturia, Painful Urination and Urgency. Musculoskeletal Not Present- Back Pain, Joint Pain, Joint Stiffness, Muscle Pain, Muscle Weakness and Swelling of Extremities. Neurological Present- Headaches. Not Present- Decreased Memory, Fainting, Numbness, Seizures, Tingling, Tremor, Trouble walking and Weakness. Psychiatric Not Present- Anxiety, Bipolar, Change in Sleep Pattern, Depression, Fearful and Frequent crying. Endocrine Present- New Diabetes. Not Present- Cold Intolerance, Excessive Hunger, Hair Changes, Heat Intolerance and Hot flashes. Hematology Present- Easy  Bruising. Not Present- Blood Thinners, Excessive bleeding, Gland problems, HIV and Persistent Infections.  Vitals (Zachary Nolan CMA; 01/16/2020 1:47 PM) 01/16/2020 1:46 PM Weight: 244.5 lb Height: 74in Body Surface Area: 2.37 m Body Mass Index: 31.39 kg/m  Temp.: 97.22F  Pulse: 113 (Regular)  BP: 122/74(Sitting, Left Arm, Standard)       Physical Exam (Zachary Chandler A. Ninfa Linden MD; 01/16/2020 2:12 PM) The physical exam findings are as follows: Note: On exam, he appears well  Today his abdomen is soft and nontender. There is no hepatomegaly. There are no hernias.    Assessment & Plan (Zachary Chandler A. Ninfa Linden MD; 01/16/2020 2:13 PM) SYMPTOMATIC CHOLELITHIASIS (K80.20) Impression: I reviewed his notes in the electronic medical records. I reviewed previous CT scans and an ultrasound. He does have gallstones and a mildly thickened gallbladder wall. His symptoms are consistent with symptomatic cholelithiasis. Laparoscopic cholecystectomy is recommended. I discussed the procedure in detail. The patient was given Neurosurgeon. We discussed the risks and benefits of a laparoscopic cholecystectomy and possible cholangiogram including, but not limited to, bleeding, infection, injury to surrounding structures such as the intestine or liver, bile leak, retained gallstones, need to convert to an open procedure, prolonged diarrhea, blood clots such as DVT, common bile duct injury, anesthesia risks, and possible need for additional procedures. The likelihood of improvement in symptoms and return to the patient's normal status is good. We discussed the typical post-operative recovery course. All questions were answered.

## 2020-01-26 ENCOUNTER — Ambulatory Visit (HOSPITAL_COMMUNITY): Payer: Medicare HMO | Admitting: Registered Nurse

## 2020-01-26 ENCOUNTER — Ambulatory Visit (HOSPITAL_COMMUNITY)
Admission: RE | Admit: 2020-01-26 | Discharge: 2020-01-26 | Disposition: A | Discharge status: Home / Self Care | Payer: Medicare HMO | Attending: Surgery | Admitting: Surgery

## 2020-01-26 ENCOUNTER — Encounter (HOSPITAL_COMMUNITY): Payer: Self-pay | Admitting: Surgery

## 2020-01-26 ENCOUNTER — Encounter (HOSPITAL_COMMUNITY)
Admission: RE | Disposition: A | Discharge status: Home / Self Care | Payer: Self-pay | Source: Home / Self Care | Attending: Surgery

## 2020-01-26 DIAGNOSIS — Z841 Family history of disorders of kidney and ureter: Secondary | ICD-10-CM | POA: Insufficient documentation

## 2020-01-26 DIAGNOSIS — E785 Hyperlipidemia, unspecified: Secondary | ICD-10-CM | POA: Diagnosis not present

## 2020-01-26 DIAGNOSIS — Z8249 Family history of ischemic heart disease and other diseases of the circulatory system: Secondary | ICD-10-CM | POA: Insufficient documentation

## 2020-01-26 DIAGNOSIS — Z7984 Long term (current) use of oral hypoglycemic drugs: Secondary | ICD-10-CM | POA: Insufficient documentation

## 2020-01-26 DIAGNOSIS — Z79899 Other long term (current) drug therapy: Secondary | ICD-10-CM | POA: Diagnosis not present

## 2020-01-26 DIAGNOSIS — K8010 Calculus of gallbladder with chronic cholecystitis without obstruction: Secondary | ICD-10-CM | POA: Diagnosis not present

## 2020-01-26 DIAGNOSIS — E119 Type 2 diabetes mellitus without complications: Secondary | ICD-10-CM | POA: Insufficient documentation

## 2020-01-26 DIAGNOSIS — Z87891 Personal history of nicotine dependence: Secondary | ICD-10-CM | POA: Diagnosis not present

## 2020-01-26 DIAGNOSIS — K828 Other specified diseases of gallbladder: Secondary | ICD-10-CM | POA: Diagnosis not present

## 2020-01-26 DIAGNOSIS — I10 Essential (primary) hypertension: Secondary | ICD-10-CM | POA: Diagnosis not present

## 2020-01-26 DIAGNOSIS — Z833 Family history of diabetes mellitus: Secondary | ICD-10-CM | POA: Diagnosis not present

## 2020-01-26 DIAGNOSIS — Z8261 Family history of arthritis: Secondary | ICD-10-CM | POA: Diagnosis not present

## 2020-01-26 DIAGNOSIS — K8020 Calculus of gallbladder without cholecystitis without obstruction: Secondary | ICD-10-CM | POA: Diagnosis not present

## 2020-01-26 HISTORY — DX: Carpal tunnel syndrome, left upper limb: G56.02

## 2020-01-26 HISTORY — DX: Personal history of urinary calculi: Z87.442

## 2020-01-26 HISTORY — PX: CHOLECYSTECTOMY: SHX55

## 2020-01-26 HISTORY — DX: Unspecified disorder of nose and nasal sinuses: J34.9

## 2020-01-26 LAB — GLUCOSE, CAPILLARY
Glucose-Capillary: 149 mg/dL — ABNORMAL HIGH (ref 70–99)
Glucose-Capillary: 148 mg/dL — ABNORMAL HIGH (ref 70–99)

## 2020-01-26 SURGERY — LAPAROSCOPIC CHOLECYSTECTOMY
Anesthesia: General | Site: Abdomen

## 2020-01-26 MED ORDER — BUPIVACAINE HCL (PF) 0.5 % IJ SOLN
INTRAMUSCULAR | Status: DC | PRN
  Administered 2020-01-26: 20 mL

## 2020-01-26 MED ORDER — 0.9 % SODIUM CHLORIDE (POUR BTL) OPTIME
TOPICAL | Status: DC | PRN
  Administered 2020-01-26: 1000 mL

## 2020-01-26 MED ORDER — GABAPENTIN 300 MG PO CAPS
300.0000 mg | ORAL_CAPSULE | ORAL | Status: AC
  Administered 2020-01-26: 300 mg via ORAL
  Filled 2020-01-26: qty 1

## 2020-01-26 MED ORDER — HYDROMORPHONE HCL 1 MG/ML IJ SOLN: 0.2500 mg | INTRAMUSCULAR | Status: DC | PRN

## 2020-01-26 MED ORDER — CHLORHEXIDINE GLUCONATE CLOTH 2 % EX PADS: 6.0000 | MEDICATED_PAD | Freq: Once | CUTANEOUS | Status: DC

## 2020-01-26 MED ORDER — FENTANYL CITRATE (PF) 250 MCG/5ML IJ SOLN
INTRAMUSCULAR | Status: DC | PRN
  Administered 2020-01-26: 25 ug via INTRAVENOUS
  Administered 2020-01-26: 50 ug via INTRAVENOUS
  Administered 2020-01-26 (×2): 25 ug via INTRAVENOUS
  Administered 2020-01-26: 50 ug via INTRAVENOUS
  Administered 2020-01-26: 25 ug via INTRAVENOUS

## 2020-01-26 MED ORDER — FENTANYL CITRATE (PF) 100 MCG/2ML IJ SOLN
INTRAMUSCULAR | Status: AC
  Filled 2020-01-26: qty 2

## 2020-01-26 MED ORDER — LIDOCAINE HCL (PF) 2 % IJ SOLN
INTRAMUSCULAR | Status: AC
  Filled 2020-01-26: qty 5

## 2020-01-26 MED ORDER — DEXAMETHASONE SODIUM PHOSPHATE 10 MG/ML IJ SOLN
INTRAMUSCULAR | Status: DC | PRN
  Administered 2020-01-26: 5 mg via INTRAVENOUS

## 2020-01-26 MED ORDER — TRAMADOL HCL 50 MG PO TABS: 50.0000 mg | ORAL_TABLET | Freq: Four times a day (QID) | ORAL | 0 refills | Status: DC | PRN

## 2020-01-26 MED ORDER — ROCURONIUM BROMIDE 10 MG/ML (PF) SYRINGE
PREFILLED_SYRINGE | INTRAVENOUS | Status: AC
  Filled 2020-01-26: qty 10

## 2020-01-26 MED ORDER — MIDAZOLAM HCL 5 MG/5ML IJ SOLN
INTRAMUSCULAR | Status: DC | PRN
  Administered 2020-01-26: 2 mg via INTRAVENOUS

## 2020-01-26 MED ORDER — ONDANSETRON HCL 4 MG/2ML IJ SOLN: 4.0000 mg | Freq: Once | INTRAMUSCULAR | Status: DC | PRN

## 2020-01-26 MED ORDER — MEPERIDINE HCL 50 MG/ML IJ SOLN: 6.2500 mg | INTRAMUSCULAR | Status: DC | PRN

## 2020-01-26 MED ORDER — MEPERIDINE HCL 50 MG/ML IJ SOLN
INTRAMUSCULAR | Status: AC
  Administered 2020-01-26: 6.25 mg via INTRAVENOUS
  Filled 2020-01-26: qty 1

## 2020-01-26 MED ORDER — ENSURE PRE-SURGERY PO LIQD
296.0000 mL | Freq: Once | ORAL | Status: DC
  Filled 2020-01-26: qty 296

## 2020-01-26 MED ORDER — CHLORHEXIDINE GLUCONATE 0.12 % MT SOLN
15.0000 mL | Freq: Once | OROMUCOSAL | Status: AC
  Administered 2020-01-26: 15 mL via OROMUCOSAL

## 2020-01-26 MED ORDER — CEFAZOLIN SODIUM-DEXTROSE 2-4 GM/100ML-% IV SOLN
2.0000 g | INTRAVENOUS | Status: AC
  Administered 2020-01-26: 2 g via INTRAVENOUS
  Filled 2020-01-26: qty 100

## 2020-01-26 MED ORDER — LACTATED RINGERS IV SOLN: INTRAVENOUS | Status: DC

## 2020-01-26 MED ORDER — MIDAZOLAM HCL 2 MG/2ML IJ SOLN
INTRAMUSCULAR | Status: AC
  Filled 2020-01-26: qty 2

## 2020-01-26 MED ORDER — ACETAMINOPHEN 500 MG PO TABS
1000.0000 mg | ORAL_TABLET | ORAL | Status: AC
  Administered 2020-01-26: 1000 mg via ORAL
  Filled 2020-01-26: qty 2

## 2020-01-26 MED ORDER — ONDANSETRON HCL 4 MG/2ML IJ SOLN
INTRAMUSCULAR | Status: DC | PRN
  Administered 2020-01-26: 4 mg via INTRAVENOUS

## 2020-01-26 MED ORDER — ORAL CARE MOUTH RINSE: 15.0000 mL | Freq: Once | OROMUCOSAL | Status: AC

## 2020-01-26 MED ORDER — LACTATED RINGERS IR SOLN
Status: DC | PRN
  Administered 2020-01-26: 1000 mL

## 2020-01-26 MED ORDER — BUPIVACAINE HCL (PF) 0.5 % IJ SOLN
INTRAMUSCULAR | Status: AC
  Filled 2020-01-26: qty 30

## 2020-01-26 MED ORDER — ROCURONIUM BROMIDE 10 MG/ML (PF) SYRINGE
PREFILLED_SYRINGE | INTRAVENOUS | Status: DC | PRN
  Administered 2020-01-26: 70 mg via INTRAVENOUS

## 2020-01-26 MED ORDER — SUGAMMADEX SODIUM 200 MG/2ML IV SOLN
INTRAVENOUS | Status: DC | PRN
  Administered 2020-01-26: 200 mg via INTRAVENOUS
  Administered 2020-01-26: 100 mg via INTRAVENOUS

## 2020-01-26 MED ORDER — DEXAMETHASONE SODIUM PHOSPHATE 10 MG/ML IJ SOLN
INTRAMUSCULAR | Status: AC
  Filled 2020-01-26: qty 1

## 2020-01-26 MED ORDER — ONDANSETRON HCL 4 MG/2ML IJ SOLN
INTRAMUSCULAR | Status: AC
  Filled 2020-01-26: qty 2

## 2020-01-26 MED ORDER — PROPOFOL 10 MG/ML IV BOLUS
INTRAVENOUS | Status: DC | PRN
  Administered 2020-01-26: 150 mg via INTRAVENOUS

## 2020-01-26 MED ORDER — LIDOCAINE 2% (20 MG/ML) 5 ML SYRINGE
INTRAMUSCULAR | Status: DC | PRN
  Administered 2020-01-26: 60 mg via INTRAVENOUS

## 2020-01-26 SURGICAL SUPPLY — 33 items
ADH SKN CLS APL DERMABOND .7 (GAUZE/BANDAGES/DRESSINGS) ×1
APL PRP STRL LF DISP 70% ISPRP (MISCELLANEOUS) ×1
APPLIER CLIP 5 13 M/L LIGAMAX5 (MISCELLANEOUS) ×2
APR CLP MED LRG 5 ANG JAW (MISCELLANEOUS) ×1
BAG SPEC RTRVL LRG 6X4 10 (ENDOMECHANICALS) ×1
CABLE HIGH FREQUENCY MONO STRZ (ELECTRODE) ×2 IMPLANT
CHLORAPREP W/TINT 26 (MISCELLANEOUS) ×2 IMPLANT
CLIP APPLIE 5 13 M/L LIGAMAX5 (MISCELLANEOUS) ×1 IMPLANT
COVER MAYO STAND STRL (DRAPES) IMPLANT
COVER WAND RF STERILE (DRAPES) IMPLANT
DECANTER SPIKE VIAL GLASS SM (MISCELLANEOUS) ×2 IMPLANT
DERMABOND ADVANCED (GAUZE/BANDAGES/DRESSINGS) ×1
DERMABOND ADVANCED .7 DNX12 (GAUZE/BANDAGES/DRESSINGS) ×1 IMPLANT
DRAPE C-ARM 42X120 X-RAY (DRAPES) IMPLANT
ELECT REM PT RETURN 15FT ADLT (MISCELLANEOUS) ×2 IMPLANT
GLOVE BIO SURGEON STRL SZ7.5 (GLOVE) ×2 IMPLANT
GOWN STRL REUS W/TWL XL LVL3 (GOWN DISPOSABLE) ×4 IMPLANT
HEMOSTAT SURGICEL 4X8 (HEMOSTASIS) IMPLANT
KIT BASIN OR (CUSTOM PROCEDURE TRAY) ×2 IMPLANT
KIT TURNOVER KIT A (KITS) ×1 IMPLANT
PENCIL SMOKE EVACUATOR (MISCELLANEOUS) IMPLANT
POUCH SPECIMEN RETRIEVAL 10MM (ENDOMECHANICALS) ×2 IMPLANT
SCISSORS LAP 5X35 DISP (ENDOMECHANICALS) ×2 IMPLANT
SET CHOLANGIOGRAPH MIX (MISCELLANEOUS) IMPLANT
SET IRRIG TUBING LAPAROSCOPIC (IRRIGATION / IRRIGATOR) ×2 IMPLANT
SET TUBE SMOKE EVAC HIGH FLOW (TUBING) ×2 IMPLANT
SLEEVE XCEL OPT CAN 5 100 (ENDOMECHANICALS) ×4 IMPLANT
SUT MNCRL AB 4-0 PS2 18 (SUTURE) ×2 IMPLANT
TOWEL OR 17X26 10 PK STRL BLUE (TOWEL DISPOSABLE) ×2 IMPLANT
TOWEL OR NON WOVEN STRL DISP B (DISPOSABLE) ×2 IMPLANT
TRAY LAPAROSCOPIC (CUSTOM PROCEDURE TRAY) ×2 IMPLANT
TROCAR BLADELESS OPT 5 100 (ENDOMECHANICALS) ×2 IMPLANT
TROCAR XCEL BLUNT TIP 100MML (ENDOMECHANICALS) ×2 IMPLANT

## 2020-01-26 NOTE — Progress Notes (Signed)
Phase II Pt unable to void at this time, scanner bladder showing 83 ml. Dr. Ninfa Linden made aware. To discharge home per Dr. Ninfa Linden.

## 2020-01-26 NOTE — Discharge Instructions (Signed)
[facility] ______[facility] LAPAROSCOPIC SURGERY: POST OP INSTRUCTIONS Always review your discharge instruction sheet given to you by the facility where your surgery was performed. IF YOU HAVE DISABILITY OR FAMILY LEAVE FORMS, YOU MUST BRING THEM TO THE OFFICE FOR PROCESSING.   DO NOT GIVE THEM TO YOUR DOCTOR.  1. A prescription for pain medication may be given to you upon discharge.  Take your pain medication as prescribed, if needed.  If narcotic pain medicine is not needed, then you may take acetaminophen (Tylenol) or ibuprofen (Advil) as needed. 2. Take your usually prescribed medications unless otherwise directed. 3. If you need a refill on your pain medication, please contact your pharmacy.  They will contact our office to request authorization. Prescriptions will not be filled after 5pm or on week-ends. 4. You should follow a light diet the first few days after arrival home, such as soup and crackers, etc.  Be sure to include lots of fluids daily. 5. Most patients will experience some swelling and bruising in the area of the incisions.  Ice packs will help.  Swelling and bruising can take several days to resolve.  6. It is common to experience some constipation if taking pain medication after surgery.  Increasing fluid intake and taking a stool softener (such as Colace) will usually help or prevent this problem from occurring.  A mild laxative (Milk of Magnesia or Miralax) should be taken according to package instructions if there are no bowel movements after 48 hours. 7. Unless discharge instructions indicate otherwise, you may remove your bandages 24-48 hours after surgery, and you may shower at that time.  You may have steri-strips (small skin tapes) in place directly over the incision.  These strips should be left on the skin for 7-10 days.  If your surgeon used skin glue on the incision, you may shower in 24 hours.  The glue will flake off over the next 2-3 weeks.  Any sutures or  staples will be removed at the office during your follow-up visit. 8. ACTIVITIES:  You may resume regular (light) daily activities beginning the next day--such as daily self-care, walking, climbing stairs--gradually increasing activities as tolerated.  You may have sexual intercourse when it is comfortable.  Refrain from any heavy lifting or straining until approved by your doctor. a. You may drive when you are no longer taking prescription pain medication, you can comfortably wear a seatbelt, and you can safely maneuver your car and apply brakes. b. RETURN TO WORK:  __________________________________________________________ 9. You should see your doctor in the office for a follow-up appointment approximately 2-3 weeks after your surgery.  Make sure that you call for this appointment within a day or two after you arrive home to insure a convenient appointment time. 10. OTHER INSTRUCTIONS:OK TO SHOWER STARTING TOMORROW 11. ICE PACK, TYLENOL, AND IBUPROFEN ALSO FOR PAIN 12. NO LIFTING MORE THAN 15 POUNDS FOR 2 WEEKS __________________________________________________________________________________________________________________________ __________________________________________________________________________________________________________________________ WHEN TO CALL YOUR DOCTOR: 1. Fever over 101.0 2. Inability to urinate 3. Continued bleeding from incision. 4. Increased pain, redness, or drainage from the incision. 5. Increasing abdominal pain  The clinic staff is available to answer your questions during regular business hours.  Please don't hesitate to call and ask to speak to one of the nurses for clinical concerns.  If you have a medical emergency, go to the nearest emergency room or call 911.  A surgeon from [facility] is always on call at the hospital. [address], [address],   [place], [place]  [address] ? [address] [address], [place], [place]   [address] [phone] ?  [phone] ? FAX [phone] Web site: [email or web address] 

## 2020-01-26 NOTE — Interval H&P Note (Signed)
History and Physical Interval Note:no change in H and P  01/26/2020 2:15 PM  Zachary Chandler  has presented today for surgery, with the diagnosis of SYMPTOMATIC GALLSTONES.  The various methods of treatment have been discussed with the patient and family. After consideration of risks, benefits and other options for treatment, the patient has consented to  Procedure(s): LAPAROSCOPIC CHOLECYSTECTOMY (N/A) as a surgical intervention.  The patient's history has been reviewed, patient examined, no change in status, stable for surgery.  I have reviewed the patient's chart and labs.  Questions were answered to the patient's satisfaction.     Coralie Keens

## 2020-01-26 NOTE — Transfer of Care (Signed)
Immediate Anesthesia Transfer of Care Note  Patient: Zachary Chandler  Procedure(s) Performed: LAPAROSCOPIC CHOLECYSTECTOMY (N/A Abdomen)  Patient Location: PACU  Anesthesia Type:General  Level of Consciousness: awake, alert  and patient cooperative  Airway & Oxygen Therapy: Patient Spontanous Breathing and Patient connected to face mask oxygen  Post-op Assessment: Report given to RN and Post -op Vital signs reviewed and stable  Post vital signs: Reviewed and stable  Last Vitals:  Vitals Value Taken Time  BP 145/76 01/26/20 1554  Temp 36.5 C 01/26/20 1554  Pulse 78 01/26/20 1554  Resp 22 01/26/20 1554  SpO2 100 % 01/26/20 1554    Last Pain:  Vitals:   01/26/20 1554  TempSrc:   PainSc: 0-No pain      Patients Stated Pain Goal: 3 (17/47/15 9539)  Complications: No complications documented.

## 2020-01-26 NOTE — Anesthesia Procedure Notes (Signed)
Procedure Name: Intubation Date/Time: 01/26/2020 3:03 PM Performed by: Garrel Ridgel, CRNA Pre-anesthesia Checklist: Patient identified, Emergency Drugs available, Suction available and Patient being monitored Patient Re-evaluated:Patient Re-evaluated prior to induction Oxygen Delivery Method: Circle system utilized Preoxygenation: Pre-oxygenation with 100% oxygen Induction Type: IV induction Ventilation: Mask ventilation without difficulty Laryngoscope Size: Mac and 4 Grade View: Grade II Tube type: Oral Tube size: 7.5 mm Number of attempts: 1 Airway Equipment and Method: Stylet and Oral airway Placement Confirmation: ETT inserted through vocal cords under direct vision,  positive ETCO2 and breath sounds checked- equal and bilateral Secured at: 22 cm Tube secured with: Tape Dental Injury: Teeth and Oropharynx as per pre-operative assessment

## 2020-01-26 NOTE — Op Note (Signed)

## 2020-01-26 NOTE — Anesthesia Preprocedure Evaluation (Signed)
Anesthesia Evaluation  Patient identified by MRN, date of birth, ID band Patient awake    Reviewed: Allergy & Precautions, NPO status , Patient's Chart, lab work & pertinent test results  Airway Mallampati: I  TM Distance: >3 FB Neck ROM: Full    Dental   Pulmonary former smoker,    Pulmonary exam normal        Cardiovascular hypertension, Pt. on medications Normal cardiovascular exam     Neuro/Psych    GI/Hepatic   Endo/Other  diabetes, Type 2, Oral Hypoglycemic Agents  Renal/GU      Musculoskeletal   Abdominal   Peds  Hematology   Anesthesia Other Findings   Reproductive/Obstetrics                             Anesthesia Physical Anesthesia Plan  ASA: III  Anesthesia Plan: General   Post-op Pain Management:    Induction: Intravenous  PONV Risk Score and Plan: 2 and Ondansetron  Airway Management Planned: Oral ETT  Additional Equipment:   Intra-op Plan:   Post-operative Plan: Extubation in OR  Informed Consent: I have reviewed the patients History and Physical, chart, labs and discussed the procedure including the risks, benefits and alternatives for the proposed anesthesia with the patient or authorized representative who has indicated his/her understanding and acceptance.       Plan Discussed with: CRNA and Surgeon  Anesthesia Plan Comments:         Anesthesia Quick Evaluation

## 2020-01-26 NOTE — Anesthesia Postprocedure Evaluation (Signed)
Anesthesia Post Note  Patient: Zachary Chandler  Procedure(s) Performed: LAPAROSCOPIC CHOLECYSTECTOMY (N/A Abdomen)     Patient location during evaluation: PACU Anesthesia Type: General Level of consciousness: awake and alert Pain management: pain level controlled Vital Signs Assessment: post-procedure vital signs reviewed and stable Respiratory status: spontaneous breathing, nonlabored ventilation, respiratory function stable and patient connected to nasal cannula oxygen Cardiovascular status: blood pressure returned to baseline and stable Postop Assessment: no apparent nausea or vomiting Anesthetic complications: no   No complications documented.  Last Vitals:  Vitals:   01/26/20 1645 01/26/20 1816  BP: (!) 132/51 (!) 142/73  Pulse: 71 86  Resp: 15 16  Temp:  36.5 C  SpO2: 98% 97%    Last Pain:  Vitals:   01/26/20 1816  TempSrc: Oral  PainSc: 0-No pain                 Lucielle Vokes DAVID

## 2020-01-27 ENCOUNTER — Encounter (HOSPITAL_COMMUNITY): Payer: Self-pay | Admitting: Surgery

## 2020-01-30 ENCOUNTER — Encounter: Payer: Self-pay | Admitting: Family Medicine

## 2020-01-30 LAB — SURGICAL PATHOLOGY

## 2020-02-02 ENCOUNTER — Other Ambulatory Visit: Payer: Self-pay | Admitting: Family Medicine

## 2020-02-02 ENCOUNTER — Other Ambulatory Visit: Payer: Self-pay

## 2020-02-02 MED ORDER — PIOGLITAZONE HCL 45 MG PO TABS: 45.0000 mg | ORAL_TABLET | Freq: Every day | ORAL | 0 refills | Status: DC

## 2020-02-11 ENCOUNTER — Other Ambulatory Visit: Payer: Self-pay | Admitting: Family Medicine

## 2020-02-19 ENCOUNTER — Other Ambulatory Visit: Payer: Self-pay | Admitting: Family Medicine

## 2020-02-23 ENCOUNTER — Other Ambulatory Visit: Payer: Self-pay

## 2020-02-23 MED ORDER — METFORMIN HCL 1000 MG PO TABS: 1000.0000 mg | ORAL_TABLET | Freq: Two times a day (BID) | ORAL | 0 refills | Status: DC

## 2020-02-23 MED ORDER — AMLODIPINE BESYLATE 10 MG PO TABS: 10.0000 mg | ORAL_TABLET | Freq: Every day | ORAL | 0 refills | Status: DC

## 2020-02-23 MED ORDER — ATORVASTATIN CALCIUM 20 MG PO TABS: 20.0000 mg | ORAL_TABLET | Freq: Every day | ORAL | 0 refills | Status: DC

## 2020-02-24 ENCOUNTER — Telehealth: Payer: Self-pay | Admitting: Family Medicine

## 2020-02-24 NOTE — Telephone Encounter (Signed)
Pt's wife states she received an email from [facility] regarding stating information is needed to fill pt's medication refill. Informed pt to call their doctor to get the mail order processed correctly. Wife states she will be home after 2 and to please call [phone]

## 2020-02-24 NOTE — Telephone Encounter (Signed)
Spoke with pt's wife, Bethena Roys to inform 30 day supply of amlodipine, atorvastatin and metformin [facility] [place] since he has an upcoming appt on 1/26 and we are unsure if any changes will occur and possible labs during that appt. She voiced understanding. Nothing further needed at this time.

## 2020-03-01 ENCOUNTER — Other Ambulatory Visit: Payer: Self-pay

## 2020-03-06 ENCOUNTER — Other Ambulatory Visit: Payer: Self-pay

## 2020-03-07 ENCOUNTER — Encounter: Payer: Self-pay | Admitting: Family Medicine

## 2020-03-07 ENCOUNTER — Ambulatory Visit (INDEPENDENT_AMBULATORY_CARE_PROVIDER_SITE_OTHER): Payer: Medicare HMO | Admitting: Family Medicine

## 2020-03-07 VITALS — BP 126/76 | HR 88 | Temp 97.5°F | Resp 16 | Ht 73.5 in | Wt 246.8 lb

## 2020-03-07 DIAGNOSIS — E7800 Pure hypercholesterolemia, unspecified: Secondary | ICD-10-CM | POA: Diagnosis not present

## 2020-03-07 DIAGNOSIS — Z9049 Acquired absence of other specified parts of digestive tract: Secondary | ICD-10-CM

## 2020-03-07 DIAGNOSIS — I10 Essential (primary) hypertension: Secondary | ICD-10-CM

## 2020-03-07 DIAGNOSIS — E119 Type 2 diabetes mellitus without complications: Secondary | ICD-10-CM | POA: Diagnosis not present

## 2020-03-07 NOTE — Progress Notes (Signed)
OFFICE VISIT  03/07/2020  CC:  Chief Complaint  Patient presents with  . Follow-up    RCI, 33mo. Pt is fasting    HPI:    Patient is a 70 y.o. Caucasian male who presents for 3 mo f/u DM, HTN, HLD, and recent cholecystectomy (gallstones w/out acute cholecystitis on imaging). A/P as of last visit: "1) Chronic nonspecific sx's: fatigue, RUQ pain, nausea->not all necessarily related. Remote hx of gallstones per pt report. Obtain CBC w/diff and CMET+ RUQ u/s.  2) DM: stable. Hba1c, lytes/cr today.  3) HTN stable. Cont hctz 25mg  qd, amlod 10mg  qd, lisin 10mg  qd Lytes/cr today.    4) Hx of elevated sCr, possible new baseline--recheck BMET today.  Stop NSAIDS!  5) HLD: tolerating statin.  Next lipid panel check in 3 mo.  6) Recurrent sinusitis (?): long hx of at least allergic rhinitis sx's/sinus pressure. Recent augmentin for this has helped some."  INTERIM HX: Doing pretty good. Since cholecystectomy has had postprandial loose BMs waxing and waning in intensity/frequency.  Had some similar type "IBS" sx's even prior to getting GB out. No abd pain.  No n/v.  Trying to work on just adjusting eating schedule/routine.  Hx of URI/sinus sx's 2 wks ago, home covid test positive. All sx's resolved now except "my sinus probs I have had for 50 yrs".  HTN: no home monitoring.  DM: rare gluc check 160-170.  HLD: taking atorva 20mg  qd and no c/o side effects.  ROS: no fevers, no CP, no SOB, no wheezing, no cough, no dizziness, no HAs, no rashes, no melena/hematochezia.  No polyuria or polydipsia.  No myalgias or arthralgias.  No focal weakness, paresthesias, or tremors.  No acute vision or hearing abnormalities. No palpitations.     Past Medical History:  Diagnosis Date  . Carpal tunnel syndrome on left   . Cataract 07/2015   R eye--surgery recommended by his eye MD 08/03/15  . Chronic fatigue   . Closed fracture of greater tuberosity of right humerus 02/28/2019   Immobil  w/sling (Dr. Mardelle Matte)  . Colon cancer screening 02/15/2018   iFOB neg.  Repeat after 02/2019.  . Erectile dysfunction    Dr. Gaynelle Arabian.  Testosterone normal 04/2016 at [facility].  . Gout of big toe 1990s   only one episode; he cut back on peanut butter and hasn't had any prob since  . Herpes simplex ophthalmicus    L eye, has opthalmologist, uses prednisone drops prn  . History of gallstones    per pt report  . History of kidney stones   . History of prostatitis 1980s   Remote hx of seeing Dr. Terance Hart at [facility] urology--then Dr. Gaynelle Arabian.   Marland Kitchen Hyperlipidemia   . Hypertension dx'd 08/2013  . Microcytic anemia 06/2016  . Nodular basal cell carcinoma (BCC) 02/19/2017   right temple-[id number]. Dr. Denna Haggard  . Shingles    recurrent L thorax  . Sinus disease   . Thalassemia    Mild anemia with MCV 60s (alpha thal minor or beta thal trait: Hb electrophoresis not done/no result available.).  Marland Kitchen Type 2 diabetes mellitus with microalbuminuria ([place]) Dx'd 08/2013    Past Surgical History:  Procedure Laterality Date  . CHOLECYSTECTOMY N/A 01/26/2020   Procedure: LAPAROSCOPIC CHOLECYSTECTOMY;  Surgeon: Coralie Keens, MD;  Location: [facility]S;  Service: General;  Laterality: N/A;  . NASAL POLYP EXCISION  1960  . WISDOM TOOTH EXTRACTION      Outpatient Medications Prior to Visit  Medication Sig Dispense Refill  .  amLODipine (NORVASC) 10 MG tablet Take 1 tablet (10 mg total) by mouth daily. 30 tablet 0  . atorvastatin (LIPITOR) 20 MG tablet Take 1 tablet (20 mg total) by mouth daily. 30 tablet 0  . cetirizine (ZYRTEC) 10 MG tablet Take 10 mg by mouth daily.    . hydrochlorothiazide (HYDRODIURIL) 25 MG tablet TAKE 1 TABLET DAILY (Patient taking differently: Take 25 mg by mouth daily.) 90 tablet 1  . hydrocortisone valerate ointment (WEST-CORT) 0.2 % Appy bid to affected area prn (Patient taking differently: Apply 1 application topically 2 (two) times daily as needed (skin irritation/rash).) 15 g 2  .  lisinopril (ZESTRIL) 10 MG tablet TAKE 1 TABLET BY MOUTH EVERY DAY 90 tablet 0  . metFORMIN (GLUCOPHAGE) 1000 MG tablet Take 1 tablet (1,000 mg total) by mouth 2 (two) times daily with a meal. 60 tablet 0  . phenylephrine (4-WAY FAST ACTING) 1 % nasal spray Place 1 drop into both nostrils every 6 (six) hours as needed for congestion.    . pioglitazone (ACTOS) 45 MG tablet TAKE 1 TABLET BY MOUTH EVERY DAY 16 tablet 0  . acetaminophen (TYLENOL) 500 MG tablet Take 500-1,000 mg by mouth every 6 (six) hours as needed (pain.). (Patient not taking: Reported on 03/07/2020)    . glucose blood test strip Use as instructed (Patient not taking: Reported on 03/07/2020) 100 each 6  . Lancets (FREESTYLE) lancets Use as instructed (Patient not taking: Reported on 03/07/2020) 100 each 12  . prednisoLONE acetate (PRED FORTE) 1 % ophthalmic suspension Place 1 drop into both eyes daily as needed (eye irritation). (Patient not taking: Reported on 03/07/2020)    . traMADol (ULTRAM) 50 MG tablet Take 1 tablet (50 mg total) by mouth every 6 (six) hours as needed for moderate pain or severe pain. (Patient not taking: Reported on 03/07/2020) 25 tablet 0   No facility-administered medications prior to visit.    No Known Allergies  ROS As per HPI  PE: Vitals with BMI 03/07/2020 01/26/2020 01/26/2020  Height 6' 1.5" - -  Weight 246 lbs 13 oz - -  BMI 32.35 - -  Systolic 573 220 254  Diastolic 76 73 51  Pulse 88 86 71     Gen: Alert, well appearing.  Patient is oriented to person, place, time, and situation. AFFECT: pleasant, lucid thought and speech. CV: RRR, no m/r/g.   LUNGS: CTA bilat, nonlabored resps, good aeration in all lung fields. ABD: soft, NT/ND EXT: no clubbing or cyanosis.  no edema.  Foot exam - no swelling, tenderness or skin or vascular lesions. Color and temperature is normal. Sensation is intact. Peripheral pulses are palpable. Toenails are normal.   LABS:  Lab Results  Component Value Date    TSH 0.84 07/24/2017   Lab Results  Component Value Date   WBC 5.5 01/24/2020   HGB 11.2 (L) 01/24/2020   HCT 37.0 (L) 01/24/2020   MCV 63.7 (L) 01/24/2020   PLT 239 01/24/2020   Lab Results  Component Value Date   IRON 187 (H) 07/09/2016   FERRITIN 260.1 07/09/2016   No results found for: VITAMINB12  Lab Results  Component Value Date   CREATININE 1.17 01/24/2020   BUN 18 01/24/2020   NA 137 01/24/2020   K 4.0 01/24/2020   CL 101 01/24/2020   CO2 23 01/24/2020   Lab Results  Component Value Date   ALT 9 12/06/2019   AST 7 (L) 12/06/2019   ALKPHOS 48 07/08/2016  BILITOT 1.0 12/06/2019   Lab Results  Component Value Date   CHOL 108 02/22/2019   Lab Results  Component Value Date   HDL 37 (L) 02/22/2019   Lab Results  Component Value Date   LDLCALC 48 02/22/2019   Lab Results  Component Value Date   TRIG 150 (H) 02/22/2019   Lab Results  Component Value Date   CHOLHDL 2.9 02/22/2019   Lab Results  Component Value Date   PSA 0.93 05/31/2019   PSA 0.86 02/01/2018   PSA 0.73 01/16/2017   Lab Results  Component Value Date   HGBA1C 6.6 (H) 12/06/2019    IMPRESSION AND PLAN:  1) DM: has been stable.  Cont metformin and actos. a1c, microalb/cr, and lytes/cr today. Feet exam normal today.  2) HTN: The current medical regimen is effective;  continue present plan and medications. Lytes/cr today.  3) HLD: tolerating statin. FLP and hepatic panel today.  4) Cholelithiasis w/chronic cholecystitis: he is doing better s/p lap chole about 6 wks ago. CMET and CBC today. Hopefully some of his postprandial diarrhea issue will clear up over the next few months but I don't think it all can be attributed to having had his GB out.  He agrees that he does have some baseline IBS. May consider cholestyramine in 3-6 mo if things not signif improving.  An After Visit Summary was printed and given to the patient.  FOLLOW UP: Return in about 3 months (around  06/05/2020) for annual CPE (fasting).  Signed:  Crissie Sickles, MD           03/07/2020

## 2020-03-08 ENCOUNTER — Telehealth: Payer: Self-pay | Admitting: Family Medicine

## 2020-03-08 ENCOUNTER — Other Ambulatory Visit: Payer: Self-pay

## 2020-03-08 LAB — HEMOGLOBIN A1C
Hgb A1c MFr Bld: 6.6 %{Hb} — ABNORMAL HIGH
Mean Plasma Glucose: 143 mg/dL
eAG (mmol/L): 7.9 mmol/L

## 2020-03-08 LAB — LIPID PANEL
Cholesterol: 106 mg/dL
HDL: 34 mg/dL — ABNORMAL LOW
LDL Cholesterol (Calc): 51 mg/dL
Non-HDL Cholesterol (Calc): 72 mg/dL
Total CHOL/HDL Ratio: 3.1 (calc)
Triglycerides: 131 mg/dL

## 2020-03-08 LAB — COMPREHENSIVE METABOLIC PANEL WITH GFR
AG Ratio: 1.6 (calc) (ref 1.0–2.5)
ALT: 51 U/L — ABNORMAL HIGH (ref 9–46)
AST: 31 U/L (ref 10–35)
Albumin: 4.1 g/dL (ref 3.6–5.1)
Alkaline phosphatase (APISO): 64 U/L (ref 35–144)
BUN: 21 mg/dL (ref 7–25)
CO2: 24 mmol/L (ref 20–32)
Calcium: 8.8 mg/dL (ref 8.6–10.3)
Chloride: 104 mmol/L (ref 98–110)
Creat: 1.06 mg/dL (ref 0.70–1.18)
Globulin: 2.6 g/dL (ref 1.9–3.7)
Glucose, Bld: 151 mg/dL — ABNORMAL HIGH (ref 65–99)
Potassium: 3.9 mmol/L (ref 3.5–5.3)
Sodium: 141 mmol/L (ref 135–146)
Total Bilirubin: 0.8 mg/dL (ref 0.2–1.2)
Total Protein: 6.7 g/dL (ref 6.1–8.1)

## 2020-03-08 LAB — CBC WITH DIFFERENTIAL/PLATELET
Absolute Monocytes: 424 {cells}/uL (ref 200–950)
Basophils Absolute: 32 {cells}/uL (ref 0–200)
Basophils Relative: 0.8 %
Eosinophils Absolute: 60 {cells}/uL (ref 15–500)
Eosinophils Relative: 1.5 %
HCT: 36.7 % — ABNORMAL LOW (ref 38.5–50.0)
Hemoglobin: 11.3 g/dL — ABNORMAL LOW (ref 13.2–17.1)
Lymphs Abs: 1072 {cells}/uL (ref 850–3900)
MCH: 19.3 pg — ABNORMAL LOW (ref 27.0–33.0)
MCHC: 30.8 g/dL — ABNORMAL LOW (ref 32.0–36.0)
MCV: 62.7 fL — ABNORMAL LOW (ref 80.0–100.0)
Monocytes Relative: 10.6 %
Neutro Abs: 2412 {cells}/uL (ref 1500–7800)
Neutrophils Relative %: 60.3 %
Platelets: 260 Thousand/uL (ref 140–400)
RBC: 5.85 Million/uL — ABNORMAL HIGH (ref 4.20–5.80)
RDW: 17.1 % — ABNORMAL HIGH (ref 11.0–15.0)
Total Lymphocyte: 26.8 %
WBC: 4 Thousand/uL (ref 3.8–10.8)

## 2020-03-08 LAB — CBC MORPHOLOGY

## 2020-03-08 MED ORDER — AMLODIPINE BESYLATE 10 MG PO TABS: 10.0000 mg | ORAL_TABLET | Freq: Every day | ORAL | 1 refills | Status: DC

## 2020-03-08 MED ORDER — ATORVASTATIN CALCIUM 20 MG PO TABS: 20.0000 mg | ORAL_TABLET | Freq: Every day | ORAL | 1 refills | Status: DC

## 2020-03-08 MED ORDER — METFORMIN HCL 1000 MG PO TABS: 1000.0000 mg | ORAL_TABLET | Freq: Two times a day (BID) | ORAL | 1 refills | Status: DC

## 2020-03-08 NOTE — Telephone Encounter (Signed)
Refills submitted electronically for requested medications including reference #[id number]. 90 day supply with 1 refill.

## 2020-03-08 NOTE — Telephone Encounter (Signed)
[facility] called for refills of: Lipitor, amlodipine and metformin.  Pharmacy phone number [phone]. Reference# for this order (including all three meds) is [id number].

## 2020-03-09 ENCOUNTER — Telehealth: Payer: Self-pay

## 2020-03-09 NOTE — Telephone Encounter (Signed)
Spoke with someone from pharmacy line, reference # provided and verified no issues with recent refills. Relayed information to patient's wife, Judy(DPR). Nothing further needed.

## 2020-03-09 NOTE — Telephone Encounter (Signed)
Patient received letter from mail order pharmacy - it states for patient to contact his PCP regarding this prescription, there is a problem in filling the medication.   metFORMIN (GLUCOPHAGE) 1000 MG tablet [[id number]]   Please call: [facility], [place] [address], [place] [address]  Phone:  [phone] Fax:  [phone]

## 2020-03-17 ENCOUNTER — Other Ambulatory Visit: Payer: Self-pay | Admitting: Family Medicine

## 2020-05-08 ENCOUNTER — Other Ambulatory Visit: Payer: Self-pay | Admitting: Family Medicine

## 2020-05-14 ENCOUNTER — Other Ambulatory Visit: Payer: Self-pay | Admitting: Family Medicine

## 2020-05-31 ENCOUNTER — Other Ambulatory Visit: Payer: Self-pay | Admitting: Family Medicine

## 2020-06-01 ENCOUNTER — Other Ambulatory Visit: Payer: Self-pay | Admitting: Family Medicine

## 2020-06-06 ENCOUNTER — Encounter: Payer: Self-pay | Admitting: Family Medicine

## 2020-06-06 ENCOUNTER — Ambulatory Visit (INDEPENDENT_AMBULATORY_CARE_PROVIDER_SITE_OTHER): Payer: Medicare HMO | Admitting: Family Medicine

## 2020-06-06 ENCOUNTER — Other Ambulatory Visit: Payer: Self-pay

## 2020-06-06 VITALS — BP 120/73 | HR 81 | Temp 97.4°F | Resp 16 | Ht 72.0 in | Wt 248.8 lb

## 2020-06-06 DIAGNOSIS — E119 Type 2 diabetes mellitus without complications: Secondary | ICD-10-CM | POA: Diagnosis not present

## 2020-06-06 DIAGNOSIS — D229 Melanocytic nevi, unspecified: Secondary | ICD-10-CM | POA: Diagnosis not present

## 2020-06-06 DIAGNOSIS — E7800 Pure hypercholesterolemia, unspecified: Secondary | ICD-10-CM

## 2020-06-06 DIAGNOSIS — I10 Essential (primary) hypertension: Secondary | ICD-10-CM

## 2020-06-06 DIAGNOSIS — E1129 Type 2 diabetes mellitus with other diabetic kidney complication: Secondary | ICD-10-CM | POA: Diagnosis not present

## 2020-06-06 DIAGNOSIS — Z9049 Acquired absence of other specified parts of digestive tract: Secondary | ICD-10-CM

## 2020-06-06 DIAGNOSIS — Z0000 Encounter for general adult medical examination without abnormal findings: Secondary | ICD-10-CM | POA: Diagnosis not present

## 2020-06-06 DIAGNOSIS — Z1211 Encounter for screening for malignant neoplasm of colon: Secondary | ICD-10-CM | POA: Diagnosis not present

## 2020-06-06 DIAGNOSIS — R809 Proteinuria, unspecified: Secondary | ICD-10-CM

## 2020-06-06 MED ORDER — PIOGLITAZONE HCL 45 MG PO TABS: 45.0000 mg | ORAL_TABLET | Freq: Every day | ORAL | 3 refills | Status: DC

## 2020-06-06 MED ORDER — ATORVASTATIN CALCIUM 20 MG PO TABS: 1.0000 | ORAL_TABLET | Freq: Every day | ORAL | 3 refills | Status: DC

## 2020-06-06 NOTE — Progress Notes (Signed)
Office Note 06/06/2020  CC:  Chief Complaint  Patient presents with  . Annual Exam    Fasting    HPI:  Zachary Chandler is a 70 y.o. White male who is here for annual health maintenance exam and 3 mo f/u DM, HTN, and HLD. A/P as of last visit: "1) DM: has been stable.  Cont metformin and actos. a1c, microalb/cr, and lytes/cr today. Feet exam normal today.  2) HTN: The current medical regimen is effective;  continue present plan and medications. Lytes/cr today.  3) HLD: tolerating statin. FLP and hepatic panel today.  4) Cholelithiasis w/chronic cholecystitis: he is doing better s/p lap chole about 6 wks ago. CMET and CBC today. Hopefully some of his postprandial diarrhea issue will clear up over the next few months but I don't think it all can be attributed to having had his GB out.  He agrees that he does have some baseline IBS. May consider cholestyramine in 3-6 mo if things not signif improving."  INTERIM HX: All labs stable last visit.   DM 2: Hba1c 6.6% last check 3 mo ago. Rare glucose check "150-169" but no check in last couple months. Compliant with meds, says he eats fairly well.  HTN: occ home check is <130/80 consistently.  HLD: tolerating atorva 20mg  qd.  Still with postprandial loose BM about 3 times a day but it is firming up a little over time. Uses cholestyramine only occas and it binds him up when he takes it. This was rx'd by Dr. Ninfa Linden.  Past Medical History:  Diagnosis Date  . Carpal tunnel syndrome on left   . Cataract 07/2015   R eye--surgery recommended by his eye MD 08/03/15  . Chronic fatigue   . Closed fracture of greater tuberosity of right humerus 02/28/2019   Immobil w/sling (Dr. Mardelle Matte)  . Colon cancer screening 02/15/2018   iFOB neg.  Repeat after 02/2019.  . Erectile dysfunction    Dr. Gaynelle Arabian.  Testosterone normal 04/2016 at [facility].  . Gout of big toe 1990s   only one episode; he cut back on peanut butter and hasn't had  any prob since  . Herpes simplex ophthalmicus    L eye, has opthalmologist, uses prednisone drops prn  . History of gallstones    per pt report  . History of kidney stones   . History of prostatitis 1980s   Remote hx of seeing Dr. Terance Hart at [facility]--then Dr. Gaynelle Arabian.   Marland Kitchen Hyperlipidemia   . Hypertension dx'd 08/2013  . Microcytic anemia 06/2016  . Nodular basal cell carcinoma (BCC) 02/19/2017   right temple-[id number]. Dr. Denna Haggard  . Shingles    recurrent L thorax  . Sinus disease   . Thalassemia    Mild anemia with MCV 60s (alpha thal minor or beta thal trait: Hb electrophoresis not done/no result available.).  Marland Kitchen Type 2 diabetes mellitus with microalbuminuria ([place]) Dx'd 08/2013    Past Surgical History:  Procedure Laterality Date  . CHOLECYSTECTOMY N/A 01/26/2020   Procedure: LAPAROSCOPIC CHOLECYSTECTOMY;  Surgeon: Coralie Keens, MD;  Location: [facility];  Service: General;  Laterality: N/A;  . NASAL POLYP EXCISION  1960  . WISDOM TOOTH EXTRACTION      Family History  Problem Relation Age of Onset  . Heart disease Mother   . Hypertension Mother   . Diabetes Mother   . Heart disease Father   . Heart attack Sister     Social History   Socioeconomic History  . Marital status:  Married    Spouse name: Not on file  . Number of children: Not on file  . Years of education: Not on file  . Highest education level: Not on file  Occupational History  . Not on file  Tobacco Use  . Smoking status: Former Smoker    Quit date: 02/28/1970    Years since quitting: 50.3  . Smokeless tobacco: Never Used  Vaping Use  . Vaping Use: Never used  Substance and Sexual Activity  . Alcohol use: No    Alcohol/week: 0.0 standard drinks  . Drug use: No  . Sexual activity: Not on file  Other Topics Concern  . Not on file  Social History Narrative   Married, has one 50 y/o son.   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     Orig from [place].   Was in [employer]  [phone].  No combat duty.  No VA care.   Smoked 10-12 yrs, quit in his early 49s.   Alcohol: none   No hx of drugs.            Social Determinants of Health   Financial Resource Strain: Not on file  Food Insecurity: Not on file  Transportation Needs: Not on file  Physical Activity: Not on file  Stress: Not on file  Social Connections: Not on file  Intimate Partner Violence: Not on file    Outpatient Medications Prior to Visit  Medication Sig Dispense Refill  . amLODipine (NORVASC) 10 MG tablet Take 1 tablet (10 mg total) by mouth daily. 90 tablet 1  . cetirizine (ZYRTEC) 10 MG tablet Take 10 mg by mouth daily.    . hydrochlorothiazide (HYDRODIURIL) 25 MG tablet TAKE 1 TABLET DAILY 90 tablet 1  . lisinopril (ZESTRIL) 10 MG tablet TAKE 1 TABLET BY MOUTH EVERY DAY 90 tablet 0  . metFORMIN (GLUCOPHAGE) 1000 MG tablet Take 1 tablet (1,000 mg total) by mouth 2 (two) times daily with a meal. 180 tablet 1  . phenylephrine (4-WAY FAST ACTING) 1 % nasal spray Place 1 drop into both nostrils every 6 (six) hours as needed for congestion.    Marland Kitchen atorvastatin (LIPITOR) 20 MG tablet TAKE 1 TABLET BY MOUTH EVERY DAY 30 tablet 0  . pioglitazone (ACTOS) 45 MG tablet TAKE 1 TABLET BY MOUTH EVERY DAY 30 tablet 0  . acetaminophen (TYLENOL) 500 MG tablet Take 500-1,000 mg by mouth every 6 (six) hours as needed (pain.). (Patient not taking: No sig reported)    . glucose blood test strip Use as instructed (Patient not taking: No sig reported) 100 each 6  . hydrocortisone valerate ointment (WEST-CORT) 0.2 % Appy bid to affected area prn (Patient not taking: Reported on 06/06/2020) 15 g 2  . Lancets (FREESTYLE) lancets Use as instructed (Patient not taking: No sig reported) 100 each 12  . prednisoLONE acetate (PRED FORTE) 1 % ophthalmic suspension Place 1 drop into both eyes daily as needed (eye irritation). (Patient not taking: No sig reported)     No facility-administered medications prior to visit.    No  Known Allergies  ROS Review of Systems  Constitutional: Negative for appetite change, chills, fatigue and fever.  HENT: Negative for congestion, dental problem, ear pain and sore throat.   Eyes: Negative for discharge, redness and visual disturbance.  Respiratory: Negative for cough, chest tightness, shortness of breath and wheezing.   Cardiovascular: Negative for chest pain, palpitations and leg swelling.  Gastrointestinal: Positive for diarrhea. Negative for abdominal  pain, blood in stool, nausea and vomiting.  Genitourinary: Negative for difficulty urinating, dysuria, flank pain, frequency, hematuria and urgency.  Musculoskeletal: Negative for arthralgias, back pain, joint swelling, myalgias and neck stiffness.  Skin: Negative for pallor and rash.  Neurological: Negative for dizziness, speech difficulty, weakness and headaches.  Hematological: Negative for adenopathy. Does not bruise/bleed easily.  Psychiatric/Behavioral: Negative for confusion and sleep disturbance. The patient is not nervous/anxious.     PE; Vitals with BMI 06/06/2020 03/07/2020 01/26/2020  Height 6\' 0"  6' 1.5" -  Weight 248 lbs 13 oz 246 lbs 13 oz -  BMI 76.72 09.47 -  Systolic 096 283 662  Diastolic 73 76 73  Pulse 81 88 86    Gen: Alert, well appearing.  Patient is oriented to person, place, time, and situation. AFFECT: pleasant, lucid thought and speech. ENT: Ears: EACs clear, normal epithelium.  TMs with good light reflex and landmarks bilaterally.  Eyes: no injection, icteris, swelling, or exudate.  EOMI, PERRLA. Nose: no drainage or turbinate edema/swelling.  No injection or focal lesion.  Mouth: lips without lesion/swelling.  Oral mucosa pink and moist.  Dentition intact and without obvious caries or gingival swelling.  Oropharynx without erythema, exudate, or swelling.  Neck: supple/nontender.  No LAD, mass, or TM.  Carotid pulses 2+ bilaterally, without bruits. CV: RRR, no m/r/g.   LUNGS: CTA bilat,  nonlabored resps, good aeration in all lung fields. ABD: soft, NT, ND, BS normal.  No hepatospenomegaly or mass.  No bruits. EXT: no clubbing, cyanosis, or edema.  Musculoskeletal: no joint swelling, erythema, warmth, or tenderness.  ROM of all joints intact. Skin - Right retroauricular area with approx 3 mm dark brown nevus with some irregularity of borders and color.  Otherwise no sores or suspicious lesions or rashes or color changes   Pertinent labs:  Lab Results  Component Value Date   TSH 0.84 07/24/2017   Lab Results  Component Value Date   WBC 4.0 03/07/2020   HGB 11.3 (L) 03/07/2020   HCT 36.7 (L) 03/07/2020   MCV 62.7 (L) 03/07/2020   PLT 260 03/07/2020   Lab Results  Component Value Date   IRON 187 (H) 07/09/2016   FERRITIN 260.1 07/09/2016    Lab Results  Component Value Date   CREATININE 1.06 03/07/2020   BUN 21 03/07/2020   NA 141 03/07/2020   K 3.9 03/07/2020   CL 104 03/07/2020   CO2 24 03/07/2020   Lab Results  Component Value Date   ALT 51 (H) 03/07/2020   AST 31 03/07/2020   ALKPHOS 48 07/08/2016   BILITOT 0.8 03/07/2020   Lab Results  Component Value Date   CHOL 106 03/07/2020   Lab Results  Component Value Date   HDL 34 (L) 03/07/2020   Lab Results  Component Value Date   LDLCALC 51 03/07/2020   Lab Results  Component Value Date   TRIG 131 03/07/2020   Lab Results  Component Value Date   CHOLHDL 3.1 03/07/2020   Lab Results  Component Value Date   PSA 0.93 05/31/2019   PSA 0.86 02/01/2018   PSA 0.73 01/16/2017   Lab Results  Component Value Date   HGBA1C 6.6 (H) 03/07/2020   ASSESSMENT AND PLAN:   1) DM: historically well controlled on metformin 1000 mg bid and pioglit 45mg  qd. Hba1c and urine microalb/cr today.  2) HTN: well controlled on amlod 10mg  qd, lisin 10mg  qd, and hctz 25 qd. Lytes/cr today  3) HLD: tolerating  atorva 20mg  qd. LDL goal <70: LDL was 51 three mo/ago. Plan rpt lipids 3 mo.  4) Health  maintenance exam: Reviewed age and gender appropriate health maintenance issues (prudent diet, regular exercise, health risks of tobacco and excessive alcohol, use of seatbelts, fire alarms in home, use of sunscreen).  Also reviewed age and gender appropriate health screening as well as vaccine recommendations. Vaccines: Pt declines shingrix.  Otherwise ALL UTD. Labs: cmet, Hba1c Prostate ca screening: discussed risks/benefits of ongoing PSA screening after age 58, shared decision-making process utilized today and decided to stop any further prostate ca screening at this time.. Colon ca screening: repeat iFOB ordered last year-->never did it.  This was ordered again today.  5) Atypical nevus, R retroauricular region-->refer to derm.  An After Visit Summary was printed and given to the patient.  FOLLOW UP:  Return in about 3 months (around 09/05/2020) for routine chronic illness f/u.  Signed:  Crissie Sickles, MD           06/06/2020

## 2020-06-06 NOTE — Patient Instructions (Signed)

## 2020-06-06 NOTE — Addendum Note (Signed)
Addended by: Octaviano Glow on: 06/06/2020 09:54 AM   Modules accepted: Orders

## 2020-06-07 ENCOUNTER — Other Ambulatory Visit: Payer: Self-pay

## 2020-06-07 DIAGNOSIS — I10 Essential (primary) hypertension: Secondary | ICD-10-CM

## 2020-06-07 LAB — COMPREHENSIVE METABOLIC PANEL
AG Ratio: 1.6 (calc) (ref 1.0–2.5)
ALT: 12 U/L (ref 9–46)
AST: 12 U/L (ref 10–35)
Albumin: 4.4 g/dL (ref 3.6–5.1)
Alkaline phosphatase (APISO): 69 U/L (ref 35–144)
BUN/Creatinine Ratio: 18 (calc) (ref 6–22)
BUN: 25 mg/dL (ref 7–25)
CO2: 22 mmol/L (ref 20–32)
Calcium: 9.4 mg/dL (ref 8.6–10.3)
Chloride: 104 mmol/L (ref 98–110)
Creat: 1.4 mg/dL — ABNORMAL HIGH (ref 0.70–1.18)
Globulin: 2.8 g/dL (calc) (ref 1.9–3.7)
Glucose, Bld: 138 mg/dL — ABNORMAL HIGH (ref 65–99)
Potassium: 4.2 mmol/L (ref 3.5–5.3)
Sodium: 140 mmol/L (ref 135–146)
Total Bilirubin: 1 mg/dL (ref 0.2–1.2)
Total Protein: 7.2 g/dL (ref 6.1–8.1)

## 2020-06-07 LAB — HEMOGLOBIN A1C
Hgb A1c MFr Bld: 6.4 % of total Hgb — ABNORMAL HIGH (ref <5.7)
Mean Plasma Glucose: 137 mg/dL
eAG (mmol/L): 7.6 mmol/L

## 2020-06-11 ENCOUNTER — Other Ambulatory Visit: Payer: Self-pay | Admitting: Family Medicine

## 2020-06-14 ENCOUNTER — Other Ambulatory Visit: Payer: Self-pay

## 2020-06-14 ENCOUNTER — Ambulatory Visit (INDEPENDENT_AMBULATORY_CARE_PROVIDER_SITE_OTHER): Payer: Medicare HMO

## 2020-06-14 DIAGNOSIS — Z1211 Encounter for screening for malignant neoplasm of colon: Secondary | ICD-10-CM | POA: Diagnosis not present

## 2020-06-14 DIAGNOSIS — E119 Type 2 diabetes mellitus without complications: Secondary | ICD-10-CM | POA: Diagnosis not present

## 2020-06-14 DIAGNOSIS — I10 Essential (primary) hypertension: Secondary | ICD-10-CM

## 2020-06-14 LAB — BASIC METABOLIC PANEL
BUN: 21 mg/dL (ref 6–23)
CO2: 24 mEq/L (ref 19–32)
Calcium: 9.8 mg/dL (ref 8.4–10.5)
Chloride: 103 mEq/L (ref 96–112)
Creatinine, Ser: 1.21 mg/dL (ref 0.40–1.50)
GFR: 59.99 mL/min — ABNORMAL LOW (ref >60.00)
Glucose, Bld: 156 mg/dL — ABNORMAL HIGH (ref 70–99)
Potassium: 4.3 mEq/L (ref 3.5–5.1)
Sodium: 138 mEq/L (ref 135–145)

## 2020-06-15 ENCOUNTER — Encounter: Payer: Self-pay | Admitting: Family Medicine

## 2020-06-15 LAB — MICROALBUMIN / CREATININE URINE RATIO
Creatinine, Urine: 63 mg/dL (ref 20–320)
Microalb Creat Ratio: 51 mcg/mg creat — ABNORMAL HIGH (ref <30)
Microalb, Ur: 3.2 mg/dL

## 2020-06-15 LAB — FECAL OCCULT BLOOD, IMMUNOCHEMICAL: Fecal Occult Bld: NEGATIVE

## 2020-08-08 ENCOUNTER — Other Ambulatory Visit: Payer: Self-pay

## 2020-08-08 ENCOUNTER — Encounter: Payer: Self-pay | Admitting: Dermatology

## 2020-08-08 ENCOUNTER — Other Ambulatory Visit: Payer: Self-pay | Admitting: Family Medicine

## 2020-08-08 ENCOUNTER — Ambulatory Visit: Payer: Medicare HMO | Admitting: Dermatology

## 2020-08-08 DIAGNOSIS — Z85828 Personal history of other malignant neoplasm of skin: Secondary | ICD-10-CM | POA: Diagnosis not present

## 2020-08-08 DIAGNOSIS — L821 Other seborrheic keratosis: Secondary | ICD-10-CM | POA: Diagnosis not present

## 2020-08-08 DIAGNOSIS — Z1283 Encounter for screening for malignant neoplasm of skin: Secondary | ICD-10-CM

## 2020-08-08 MED ORDER — LISINOPRIL 10 MG PO TABS: 10.0000 mg | ORAL_TABLET | Freq: Every day | ORAL | 1 refills | Status: DC

## 2020-08-09 DIAGNOSIS — J019 Acute sinusitis, unspecified: Secondary | ICD-10-CM | POA: Diagnosis not present

## 2020-08-15 ENCOUNTER — Ambulatory Visit: Payer: Medicare HMO

## 2020-08-22 ENCOUNTER — Ambulatory Visit (INDEPENDENT_AMBULATORY_CARE_PROVIDER_SITE_OTHER): Payer: Medicare HMO | Admitting: *Deleted

## 2020-08-22 DIAGNOSIS — Z0000 Encounter for general adult medical examination without abnormal findings: Secondary | ICD-10-CM

## 2020-08-22 NOTE — Patient Instructions (Signed)
[name] , Thank you for taking time to come for your Medicare Wellness Visit. I appreciate your ongoing commitment to your health goals. Please review the following plan we discussed and let me know if I can assist you in the future.   Screening recommendations/referrals: Colonoscopy: Up to date due 2027 Recommended yearly ophthalmology/optometry visit for glaucoma screening and checkup Recommended yearly dental visit for hygiene and checkup  Vaccinations: Influenza vaccine: education provided Pneumococcal vaccine: up to date  Tdap vaccine: up to date Shingles vaccine: education provided    Advanced directives: copy requested  Conditions/risks identified: na  Next appointment: 09-06-2020 @ 8:30 Dr. Anitra Lauth  Preventive Care 72 Years and Older, Male Preventive care refers to lifestyle choices and visits with your health care provider that can promote health and wellness. What does preventive care include? A yearly physical exam. This is also called an annual well check. Dental exams once or twice a year. Routine eye exams. Ask your health care provider how often you should have your eyes checked. Personal lifestyle choices, including: Daily care of your teeth and gums. Regular physical activity. Eating a healthy diet. Avoiding tobacco and drug use. Limiting alcohol use. Practicing safe sex. Taking low doses of aspirin every day. Taking vitamin and mineral supplements as recommended by your health care provider. What happens during an annual well check? The services and screenings done by your health care provider during your annual well check will depend on your age, overall health, lifestyle risk factors, and family history of disease. Counseling  Your health care provider may ask you questions about your: Alcohol use. Tobacco use. Drug use. Emotional well-being. Home and relationship well-being. Sexual activity. Eating habits. History of falls. Memory and ability to  understand (cognition). Work and work Statistician. Screening  You may have the following tests or measurements: Height, weight, and BMI. Blood pressure. Lipid and cholesterol levels. These may be checked every 5 years, or more frequently if you are over 53 years old. Skin check. Lung cancer screening. You may have this screening every year starting at age 53 if you have a 30-pack-year history of smoking and currently smoke or have quit within the past 15 years. Fecal occult blood test (FOBT) of the stool. You may have this test every year starting at age 2. Flexible sigmoidoscopy or colonoscopy. You may have a sigmoidoscopy every 5 years or a colonoscopy every 10 years starting at age 1. Prostate cancer screening. Recommendations will vary depending on your family history and other risks. Hepatitis C blood test. Hepatitis B blood test. Sexually transmitted disease (STD) testing. Diabetes screening. This is done by checking your blood sugar (glucose) after you have not eaten for a while (fasting). You may have this done every 1-3 years. Abdominal aortic aneurysm (AAA) screening. You may need this if you are a current or former smoker. Osteoporosis. You may be screened starting at age 25 if you are at high risk. Talk with your health care provider about your test results, treatment options, and if necessary, the need for more tests. Vaccines  Your health care provider may recommend certain vaccines, such as: Influenza vaccine. This is recommended every year. Tetanus, diphtheria, and acellular pertussis (Tdap, Td) vaccine. You may need a Td booster every 10 years. Zoster vaccine. You may need this after age 70. Pneumococcal 13-valent conjugate (PCV13) vaccine. One dose is recommended after age 49. Pneumococcal polysaccharide (PPSV23) vaccine. One dose is recommended after age 70. Talk to your health care provider about which  screenings and vaccines you need and how often you need them. This  information is not intended to replace advice given to you by your health care provider. Make sure you discuss any questions you have with your health care provider. Document Released: 02/23/2015 Document Revised: 10/17/2015 Document Reviewed: 11/28/2014 Elsevier Interactive Patient Education  2017 [place] Prevention in the Home Falls can cause injuries. They can happen to people of all ages. There are many things you can do to make your home safe and to help prevent falls. What can I do on the outside of my home? Regularly fix the edges of walkways and driveways and fix any cracks. Remove anything that might make you trip as you walk through a door, such as a raised step or threshold. Trim any bushes or trees on the path to your home. Use bright outdoor lighting. Clear any walking paths of anything that might make someone trip, such as rocks or tools. Regularly check to see if handrails are loose or broken. Make sure that both sides of any steps have handrails. Any raised decks and porches should have guardrails on the edges. Have any leaves, snow, or ice cleared regularly. Use sand or salt on walking paths during winter. Clean up any spills in your garage right away. This includes oil or grease spills. What can I do in the bathroom? Use night lights. Install grab bars by the toilet and in the tub and shower. Do not use towel bars as grab bars. Use non-skid mats or decals in the tub or shower. If you need to sit down in the shower, use a plastic, non-slip stool. Keep the floor dry. Clean up any water that spills on the floor as soon as it happens. Remove soap buildup in the tub or shower regularly. Attach bath mats securely with double-sided non-slip rug tape. Do not have throw rugs and other things on the floor that can make you trip. What can I do in the bedroom? Use night lights. Make sure that you have a light by your bed that is easy to reach. Do not use any sheets or  blankets that are too big for your bed. They should not hang down onto the floor. Have a firm chair that has side arms. You can use this for support while you get dressed. Do not have throw rugs and other things on the floor that can make you trip. What can I do in the kitchen? Clean up any spills right away. Avoid walking on wet floors. Keep items that you use a lot in easy-to-reach places. If you need to reach something above you, use a strong step stool that has a grab bar. Keep electrical cords out of the way. Do not use floor polish or wax that makes floors slippery. If you must use wax, use non-skid floor wax. Do not have throw rugs and other things on the floor that can make you trip. What can I do with my stairs? Do not leave any items on the stairs. Make sure that there are handrails on both sides of the stairs and use them. Fix handrails that are broken or loose. Make sure that handrails are as long as the stairways. Check any carpeting to make sure that it is firmly attached to the stairs. Fix any carpet that is loose or worn. Avoid having throw rugs at the top or bottom of the stairs. If you do have throw rugs, attach them to the floor with carpet  tape. Make sure that you have a light switch at the top of the stairs and the bottom of the stairs. If you do not have them, ask someone to add them for you. What else can I do to help prevent falls? Wear shoes that: Do not have high heels. Have rubber bottoms. Are comfortable and fit you well. Are closed at the toe. Do not wear sandals. If you use a stepladder: Make sure that it is fully opened. Do not climb a closed stepladder. Make sure that both sides of the stepladder are locked into place. Ask someone to hold it for you, if possible. Clearly mark and make sure that you can see: Any grab bars or handrails. First and last steps. Where the edge of each step is. Use tools that help you move around (mobility aids) if they are  needed. These include: Canes. Walkers. Scooters. Crutches. Turn on the lights when you go into a dark area. Replace any light bulbs as soon as they burn out. Set up your furniture so you have a clear path. Avoid moving your furniture around. If any of your floors are uneven, fix them. If there are any pets around you, be aware of where they are. Review your medicines with your doctor. Some medicines can make you feel dizzy. This can increase your chance of falling. Ask your doctor what other things that you can do to help prevent falls. This information is not intended to replace advice given to you by your health care provider. Make sure you discuss any questions you have with your health care provider. Document Released: 11/23/2008 Document Revised: 07/05/2015 Document Reviewed: 03/03/2014 Elsevier Interactive Patient Education  2017 [employer].

## 2020-08-22 NOTE — Progress Notes (Signed)
Subjective:   Zachary Chandler is a 70 y.o. male who presents for Medicare Annual/Subsequent preventive examination.  I connected with  Zachary Chandler on 08/22/20 by a telephone enabled telemedicine application and verified that I am speaking with the correct person using two identifiers.   I discussed the limitations of evaluation and management by telemedicine. The patient expressed understanding and agreed to proceed.   Review of Systems    NA Cardiac Risk Factors include: advanced age (>29men, >35 women);diabetes mellitus;dyslipidemia;male gender;hypertension     Objective:    Today's Vitals   There is no height or weight on file to calculate BMI.  Advanced Directives 08/22/2020 01/23/2020 11/15/2018 07/08/2016  Does Patient Have a Medical Advance Directive? Yes No No No  Type of Advance Directive [place]  Does patient want to make changes to medical advance directive? No - Patient declined - - -  Would patient like information on creating a medical advance directive? No - Patient declined - - No - Patient declined    Current Medications (verified) Outpatient Encounter Medications as of 08/22/2020  Medication Sig   acetaminophen (TYLENOL) 500 MG tablet Take 500-1,000 mg by mouth every 6 (six) hours as needed (pain.).   amLODipine (NORVASC) 10 MG tablet Take 1 tablet (10 mg total) by mouth daily.   atorvastatin (LIPITOR) 20 MG tablet Take 1 tablet (20 mg total) by mouth daily.   cetirizine (ZYRTEC) 10 MG tablet Take 10 mg by mouth daily.   cholestyramine (QUESTRAN) 4 g packet Take 4 g by mouth 3 (three) times daily as needed.   glucose blood test strip Use as instructed   hydrochlorothiazide (HYDRODIURIL) 25 MG tablet TAKE 1 TABLET DAILY   hydrocortisone valerate ointment (WEST-CORT) 0.2 % Appy bid to affected area prn   Lancets (FREESTYLE) lancets Use as instructed   lisinopril (ZESTRIL) 10 MG tablet Take 1 tablet (10 mg total) by mouth daily.    metFORMIN (GLUCOPHAGE) 1000 MG tablet Take 1 tablet (1,000 mg total) by mouth 2 (two) times daily with a meal.   phenylephrine (4-WAY FAST ACTING) 1 % nasal spray Place 1 drop into both nostrils every 6 (six) hours as needed for congestion.   pioglitazone (ACTOS) 45 MG tablet Take 1 tablet (45 mg total) by mouth daily.   prednisoLONE acetate (PRED FORTE) 1 % ophthalmic suspension Place 1 drop into both eyes daily as needed (eye irritation).   No facility-administered encounter medications on file as of 08/22/2020.    Allergies (verified) Patient has no known allergies.   History: Past Medical History:  Diagnosis Date   Carpal tunnel syndrome on left    Cataract 07/2015   R eye--surgery recommended by his eye MD 08/03/15   Chronic fatigue    Closed fracture of greater tuberosity of right humerus 02/28/2019   Immobil w/sling (Dr. Mardelle Matte)   Colon cancer screening 02/15/2018   iFOB neg 02/2018 and 06/2020.   Erectile dysfunction    Dr. Gaynelle Arabian.  Testosterone [facility] 04/2016 at [facility].   Gout of big toe 1990s   only one episode; he cut back on peanut butter and hasn't had any prob since   Herpes simplex ophthalmicus    L eye, has opthalmologist, uses prednisone drops prn   History of gallstones    per pt report   History of kidney stones    History of prostatitis 1980s   Remote hx of seeing Dr. Terance Hart at [facility]--then Dr. Gaynelle Arabian.  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 Dr. Denna Haggard   Shingles    recurrent L thorax   Sinus disease    Thalassemia    Mild anemia with MCV 60s (alpha thal minor or beta thal trait: Hb electrophoresis not done/no result available.).   Type 2 diabetes mellitus with microalbuminuria ([place]) Dx'd 08/2013   Past Surgical History:  Procedure Laterality Date   CHOLECYSTECTOMY N/A 01/26/2020   Procedure: LAPAROSCOPIC CHOLECYSTECTOMY;  Surgeon:  Coralie Keens, MD;  Location: [facility];  Service: General;  Laterality: N/A;   NASAL POLYP EXCISION  1960   WISDOM TOOTH EXTRACTION     Family History  Problem Relation Age of Onset   Heart disease Mother    Hypertension Mother    Diabetes Mother    Heart disease Father    Heart attack Sister    Social History   Socioeconomic History   Marital status: Married    Spouse name: Not on file   Number of children: Not on file   Years of education: Not on file   Highest education level: Not on file  Occupational History   Not on file  Tobacco Use   Smoking status: Former    Pack years: 0.00    Types: Cigarettes    Quit date: 02/28/1970    Years since quitting: 50.5   Smokeless tobacco: Never  Vaping Use   Vaping Use: Never used  Substance and Sexual Activity   Alcohol use: No    Alcohol/week: 0.0 standard drinks   Drug use: No   Sexual activity: Not on file  Other Topics Concern   Not on file  Social History Narrative   Married, has one 35 y/o son.   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     Orig from [place].   Was in [employer] [phone].  No combat duty.  No VA care.   Smoked 10-12 yrs, quit in his early 55s.   Alcohol: none   No hx of drugs.            Social Determinants of Health   Financial Resource Strain: Low Risk    Difficulty of Paying Living Expenses: Not hard at all  Food Insecurity: No Food Insecurity   Worried About Charity fundraiser in the Last Year: Never true   [place] in the Last Year: Never true  Transportation Needs: No Transportation Needs   Lack of Transportation (Medical): No   Lack of Transportation (Non-Medical): No  Physical Activity: Insufficiently Active   Days of Exercise per Week: 3 days   Minutes of Exercise per Session: 30 min  Stress: No Stress Concern Present   Feeling of Stress : Not at all  Social Connections: Moderately Integrated   Frequency of Communication with Friends and Family: Three  times a week   Frequency of Social Gatherings with Friends and Family: Once a week   Attends Religious Services: More than 4 times per year   Active Member of [employer] or Organizations: No   Attends Music therapist: Never   Marital Status: Married    Tobacco Counseling Counseling given: Not Answered   Clinical Intake:  Pre-visit preparation completed: Yes  Pain : No/denies pain     Nutritional Risks: None Diabetes: Yes CBG done?: No Did pt. bring in CBG monitor from home?: No  How often do  you need to have someone help you when you read instructions, pamphlets, or other written materials from your doctor or pharmacy?: 1 - Never  Diabetic  YES  Nutrition Risk Assessment:  Has the patient had any N/V/D within the last 2 months?  No  Does the patient have any non-healing wounds?  No  Has the patient had any unintentional weight loss or weight gain?  No   Diabetes:  Is the patient diabetic?  Yes  If diabetic, was a CBG obtained today?  No  Did the patient bring in their glucometer from home?  No  How often do you monitor your CBG's? 1 x a month.   Financial Strains and Diabetes Management:  Are you having any financial strains with the device, your supplies or your medication? No .  Does the patient want to be seen by Chronic Care Management for management of their diabetes?  No  Would the patient like to be referred to a Nutritionist or for Diabetic Management?  No   Diabetic Exams:  Diabetic Eye Exam: Completed 2021. Overdue for diabetic eye exam. Pt has been advised about the importance in completing this exam.  Advised pt to expect a call from office referred to regarding appt.  Diabetic Foot Exam:. Pt has been advised about the importance in completing this exam.    Interpreter Needed?: No  Information entered by :: Leroy Kennedy LPN   Activities of Daily Living In your present state of health, do you have any difficulty performing the following  activities: 08/22/2020 01/23/2020  Hearing? N -  Vision? N -  Difficulty concentrating or making decisions? N -  Walking or climbing stairs? N -  Dressing or bathing? N -  Doing errands, shopping? N N  Preparing Food and eating ? N -  Using the Toilet? N -  In the past six months, have you accidently leaked urine? N -  Do you have problems with loss of bowel control? N -  Managing your Medications? N -  Managing your Finances? N -  Housekeeping or managing your Housekeeping? N -  Some recent data might be hidden    Patient Care Team: Tammi Sou, MD as PCP - General (Family Medicine) Danice Goltz, MD as Consulting Physician (Ophthalmology) Carolan Clines, MD (Inactive) as Consulting Physician (Urology) Marchia Bond, MD as Consulting Physician (Orthopedic Surgery) Coralie Keens, MD as Consulting Physician (General Surgery) [name], MD as Consulting Physician (Dermatology)  Indicate any recent Medical Services you may have received from other than [facility] providers in the past year (date may be approximate).     Assessment:   This is a routine wellness examination for Roen.  Hearing/Vision screen Hearing Screening - Comments:: No trouble hearing   Vision Screening - Comments:: [facility] Will schedule an appointment soon  Dietary issues and exercise activities discussed: Current Exercise Habits: The patient does not participate in regular exercise at present, Exercise limited by: None identified   Goals Addressed               This Visit's Progress     patient (pt-stated)   On track     Maintain current health.        Patient Stated        Maintain current health        Depression Screen PHQ 2/9 Scores 08/22/2020 06/06/2020 08/31/2019 08/09/2018 10/20/2017 07/14/2017 07/08/2016  PHQ - 2 Score 0 0 0 0 0 1 0  PHQ- 9  Score - - - - 4 5 -    Fall Risk Fall Risk  08/22/2020 06/06/2020 02/22/2019 07/14/2017 07/08/2016  Falls in the past  year? 0 0 0 No No  Number falls in past yr: 0 0 0 - -  Injury with Fall? 0 0 0 - -  Follow up Falls evaluation completed;Falls prevention discussed Falls evaluation completed Falls evaluation completed - -    FALL RISK PREVENTION PERTAINING TO THE HOME:  Any stairs in or around the home? Yes  If so, are there any without handrails? Yes  Home free of loose throw rugs in walkways, pet beds, electrical cords, etc? Yes  Adequate lighting in your home to reduce risk of falls? Yes   ASSISTIVE DEVICES UTILIZED TO PREVENT FALLS:  Life alert? No  Use of a cane, walker or w/c? No  Grab bars in the bathroom? No  Shower chair or bench in shower? No  Elevated toilet seat or a handicapped toilet? No   TIMED UP AND GO:  Was the test performed? No .  Tele-Health Visit  Cognitive Function:   Normal cognitive status assessed by direct observation by this Nurse Health Advisor. No abnormalities found.        Immunizations Immunization History  Administered Date(s) Administered   PFIZER Comirnaty(Gray Top)Covid-19 Tri-Sucrose Vaccine 08/06/2020   PFIZER(Purple Top)SARS-COV-2 Vaccination 11/01/2019, 11/22/2019   Pneumococcal Conjugate-13 09/20/2013   Pneumococcal Polysaccharide-23 01/08/2015   Tdap 01/07/2016    TDAP status: Up to date  Flu Vaccine status: Due, Education has been provided regarding the importance of this vaccine. Advised may receive this vaccine at local pharmacy or Health Dept. Aware to provide a copy of the vaccination record if obtained from local pharmacy or Health Dept. Verbalized acceptance and understanding.  Pneumococcal vaccine status: Up to date  Covid-19 vaccine status: Information provided on how to obtain vaccines.   Qualifies for Shingles Vaccine? Yes   Zostavax completed No   Shingrix Completed?: No.    Education has been provided regarding the importance of this vaccine. Patient has been advised to call insurance company to determine out of pocket  expense if they have not yet received this vaccine. Advised may also receive vaccine at local pharmacy or Health Dept. Verbalized acceptance and understanding.  Screening Tests Health Maintenance  Topic Date Due   Zoster Vaccines- Shingrix (1 of 2) Never done   COLONOSCOPY (Pts 45-45yrs Insurance coverage will need to be confirmed)  07/08/2017   OPHTHALMOLOGY EXAM  12/14/2019   COVID-19 Vaccine (4 - Booster for Pfizer series) 11/06/2020   HEMOGLOBIN A1C  12/06/2020   FOOT EXAM  03/07/2021   TETANUS/TDAP  01/06/2026   Hepatitis C Screening  Completed   PNA vac Low Risk Adult  Completed   HPV VACCINES  Aged Out   INFLUENZA VACCINE  Discontinued    Health Maintenance  Health Maintenance Due  Topic Date Due   Zoster Vaccines- Shingrix (1 of 2) Never done   COLONOSCOPY (Pts 45-35yrs Insurance coverage will need to be confirmed)  07/08/2017   OPHTHALMOLOGY EXAM  12/14/2019    Colorectal cancer screening: Type of screening: Colonoscopy. Completed  . Repeat every due 2027 years  Lung Cancer Screening: (Low Dose CT Chest recommended if Age 49-80 years, 30 pack-year currently smoking OR have quit w/in 15years.) does not qualify.   Lung Cancer Screening Referral: na  Additional Screening:  Hepatitis C Screening: does not qualify; Completed   Vision Screening: Recommended annual ophthalmology exams for early detection  of glaucoma and other disorders of the eye. Is the patient up to date with their annual eye exam?  Yes  Who is the provider or what is the name of the office in which the patient attends annual eye exams? [place] If pt is not established with a provider, would they like to be referred to a provider to establish care?  established .   Dental Screening: Recommended annual dental exams for proper oral hygiene  Community Resource Referral / Chronic Care Management: CRR required this visit?  No   CCM required this visit?  No      Plan:     I have personally  reviewed and noted the following in the patient's chart:   Medical and social history Use of alcohol, tobacco or illicit drugs  Current medications and supplements including opioid prescriptions. Patient is not currently taking opioid prescriptions. Functional ability and status Nutritional status Physical activity Advanced directives List of other physicians Hospitalizations, surgeries, and ER visits in previous 12 months Vitals Screenings to include cognitive, depression, and falls Referrals and appointments  In addition, I have reviewed and discussed with patient certain preventive protocols, quality metrics, and best practice recommendations. A written personalized care plan for preventive services as well as general preventive health recommendations were provided to patient.     Leroy Kennedy, LPN   [id number]/[id number]   Nurse Notes: na

## 2020-08-23 ENCOUNTER — Other Ambulatory Visit: Payer: Self-pay | Admitting: Family Medicine

## 2020-08-25 ENCOUNTER — Encounter: Payer: Self-pay | Admitting: Dermatology

## 2020-08-25 NOTE — Progress Notes (Signed)
   Follow-Up Visit   Subjective  Zachary Chandler is a 70 y.o. male who presents for the following: Annual Exam (Per PCP check mole post right ear).  General skin check, dark spot behind right ear of possible concern Location:  Duration:  Quality:  Associated Signs/Symptoms: Modifying Factors:  Severity:  Timing: Context:   Objective  Well appearing patient in no apparent distress; mood and affect are within normal limits. Head - Anterior (Face) Scar still clear  Head - Anterior (Face), Left Breast, Mid Back, Right Mid Helix Right post ear stable defer any treatment.  5 mm brown textured flattopped papule; dermoscopy shows typical seborrheic keratosis.  Torso - Posterior (Back) No atypical pigmented lesions or nonmelanoma skin cancer waist up.    All skin waist up examined.   Assessment & Plan    History of basal cell carcinoma (BCC) Head - Anterior (Face)  Check as needed change  Seborrheic keratosis (4) Head - Anterior (Face); Left Breast; Mid Back; Right Mid Helix  Recheck as needed change  Encounter for screening for malignant neoplasm of skin Torso - Posterior (Back)  Annual skin examination      I, [name], MD, have reviewed all documentation for this visit.  The documentation on 08/25/20 for the exam, diagnosis, procedures, and orders are all accurate and complete.

## 2020-08-31 ENCOUNTER — Other Ambulatory Visit: Payer: Self-pay | Admitting: Family Medicine

## 2020-09-05 DIAGNOSIS — E1122 Type 2 diabetes mellitus with diabetic chronic kidney disease: Secondary | ICD-10-CM | POA: Diagnosis not present

## 2020-09-05 DIAGNOSIS — N1831 Chronic kidney disease, stage 3a: Secondary | ICD-10-CM | POA: Diagnosis not present

## 2020-09-05 DIAGNOSIS — E11630 Type 2 diabetes mellitus with periodontal disease: Secondary | ICD-10-CM | POA: Diagnosis not present

## 2020-09-05 DIAGNOSIS — K08409 Partial loss of teeth, unspecified cause, unspecified class: Secondary | ICD-10-CM | POA: Diagnosis not present

## 2020-09-05 DIAGNOSIS — R0981 Nasal congestion: Secondary | ICD-10-CM | POA: Diagnosis not present

## 2020-09-05 DIAGNOSIS — E785 Hyperlipidemia, unspecified: Secondary | ICD-10-CM | POA: Diagnosis not present

## 2020-09-05 DIAGNOSIS — I129 Hypertensive chronic kidney disease with stage 1 through stage 4 chronic kidney disease, or unspecified chronic kidney disease: Secondary | ICD-10-CM | POA: Diagnosis not present

## 2020-09-05 DIAGNOSIS — E1165 Type 2 diabetes mellitus with hyperglycemia: Secondary | ICD-10-CM | POA: Diagnosis not present

## 2020-09-05 DIAGNOSIS — N529 Male erectile dysfunction, unspecified: Secondary | ICD-10-CM | POA: Diagnosis not present

## 2020-09-05 DIAGNOSIS — E669 Obesity, unspecified: Secondary | ICD-10-CM | POA: Diagnosis not present

## 2020-09-05 LAB — HEMOGLOBIN A1C: Hemoglobin A1C: 6.4

## 2020-09-05 LAB — COMPREHENSIVE METABOLIC PANEL: GFR calc non Af Amer: 59

## 2020-09-06 ENCOUNTER — Ambulatory Visit (INDEPENDENT_AMBULATORY_CARE_PROVIDER_SITE_OTHER): Payer: Medicare HMO | Admitting: Family Medicine

## 2020-09-06 ENCOUNTER — Other Ambulatory Visit: Payer: Self-pay

## 2020-09-06 ENCOUNTER — Encounter: Payer: Self-pay | Admitting: Family Medicine

## 2020-09-06 VITALS — BP 151/84 | HR 68 | Temp 98.1°F | Resp 18 | Ht 73.0 in | Wt 250.4 lb

## 2020-09-06 DIAGNOSIS — E7800 Pure hypercholesterolemia, unspecified: Secondary | ICD-10-CM | POA: Diagnosis not present

## 2020-09-06 DIAGNOSIS — N2889 Other specified disorders of kidney and ureter: Secondary | ICD-10-CM

## 2020-09-06 DIAGNOSIS — I10 Essential (primary) hypertension: Secondary | ICD-10-CM

## 2020-09-06 DIAGNOSIS — N183 Chronic kidney disease, stage 3 unspecified: Secondary | ICD-10-CM

## 2020-09-06 DIAGNOSIS — R809 Proteinuria, unspecified: Secondary | ICD-10-CM | POA: Diagnosis not present

## 2020-09-06 DIAGNOSIS — E1129 Type 2 diabetes mellitus with other diabetic kidney complication: Secondary | ICD-10-CM

## 2020-09-06 NOTE — Progress Notes (Signed)
OFFICE VISIT  09/06/2020  CC: f/u DM, HTN, HLD  HPI:    Patient is a 70 y.o. Caucasian male who presents for 3 mo f/u DM, HTN, and HLD. A/P as of last visit: "1) DM: historically well controlled on metformin 1000 mg bid and pioglit '45mg'$  qd. Hba1c and urine microalb/cr today.   2) HTN: well controlled on amlod '10mg'$  qd, lisin '10mg'$  qd, and hctz 25 qd. Lytes/cr today   3) HLD: tolerating atorva '20mg'$  qd. LDL goal <70: LDL was 51 three mo/ago. Plan rpt lipids 3 mo.   4) Health maintenance exam: Reviewed age and gender appropriate health maintenance issues (prudent diet, regular exercise, health risks of tobacco and excessive alcohol, use of seatbelts, fire alarms in home, use of sunscreen).  Also reviewed age and gender appropriate health screening as well as vaccine recommendations. Vaccines: Pt declines shingrix.  Otherwise ALL UTD. Labs: cmet, Hba1c Prostate ca screening: discussed risks/benefits of ongoing PSA screening after age 72, shared decision-making process utilized today and decided to stop any further prostate ca screening at this time.. Colon ca screening: repeat iFOB ordered last year-->never did it.  This was ordered again today.   5) Atypical nevus, R retroauricular region-->refer to derm."  INTERIM HX: All labs stable last visit, sCr had improved->back to baseline.   Bp at home yesterday 125/72.  Occ up into 150s/80s. Takes meds daily. Admits he has to force himself to drink adequate fluids but has done well with this the last few weeks.  DM:  rare home gluc monitoring: fasting 150-170 is what pt recalls. Compliant with metformin and pioglitazone.   HLD: taking atorva 20 qd.  ROS as above, plus--> no fevers, no CP, no SOB, no wheezing, no cough, no dizziness, no HAs, no rashes, no melena/hematochezia.  No polyuria or polydipsia.  No myalgias or arthralgias.  No focal weakness, paresthesias, or tremors.  No acute vision or hearing abnormalities.  No dysuria or  unusual/new urinary urgency or frequency.  No recent changes in lower legs. No n/v/d or abd pain.  No palpitations.     Past Medical History:  Diagnosis Date   Carpal tunnel syndrome on left    Cataract 07/2015   R eye--surgery recommended by his eye MD 08/03/15   Chronic fatigue    Closed fracture of greater tuberosity of right humerus 02/28/2019   Immobil w/sling (Dr. Mardelle Matte)   Colon cancer screening 02/15/2018   iFOB neg 02/2018 and 06/2020.   Erectile dysfunction    Dr. Gaynelle Arabian.  Testosterone normal 04/2016 at [facility]   Gout of big toe 1990s   only one episode; he cut back on peanut butter and hasn't had any prob since   Herpes simplex ophthalmicus    L eye, has opthalmologist, uses prednisone drops prn   History of gallstones    per pt report   History of kidney stones    History of prostatitis 1980s   Remote hx of seeing Dr. Terance Hart at [facility]--then Dr. Gaynelle Arabian.    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 Dr. Denna Haggard   Shingles    recurrent L thorax   Sinus disease    Thalassemia    Mild anemia with MCV 60s (alpha thal minor or beta thal trait: Hb electrophoresis not done/no result available.).   Type 2 diabetes mellitus with microalbuminuria ([place]) Dx'd 08/2013    Past Surgical History:  Procedure Laterality Date   CHOLECYSTECTOMY N/A 01/26/2020   Procedure: LAPAROSCOPIC CHOLECYSTECTOMY;  Surgeon: Coralie Keens, MD;  Location: [facility];  Service: General;  Laterality: N/A;   NASAL POLYP EXCISION  1960   WISDOM TOOTH EXTRACTION      Outpatient Medications Prior to Visit  Medication Sig Dispense Refill   acetaminophen (TYLENOL) 500 MG tablet Take 500-1,000 mg by mouth every 6 (six) hours as needed (pain.).     amLODipine (NORVASC) 10 MG tablet TAKE 1 TABLET DAILY 90 tablet 1   atorvastatin (LIPITOR) 20 MG tablet Take 1 tablet (20 mg total) by mouth daily.  90 tablet 3   cetirizine (ZYRTEC) 10 MG tablet Take 10 mg by mouth daily.     cholestyramine (QUESTRAN) 4 g packet Take 4 g by mouth 3 (three) times daily as needed.     glucose blood test strip Use as instructed 100 each 6   hydrochlorothiazide (HYDRODIURIL) 25 MG tablet TAKE 1 TABLET DAILY 90 tablet 1   hydrocortisone valerate ointment (WEST-CORT) 0.2 % Appy bid to affected area prn 15 g 2   Lancets (FREESTYLE) lancets Use as instructed 100 each 12   lisinopril (ZESTRIL) 10 MG tablet Take 1 tablet (10 mg total) by mouth daily. 90 tablet 1   metFORMIN (GLUCOPHAGE) 1000 MG tablet TAKE 1 TABLET TWICE DAILY  WITH MEALS 180 tablet 1   phenylephrine (4-WAY FAST ACTING) 1 % nasal spray Place 1 drop into both nostrils every 6 (six) hours as needed for congestion.     pioglitazone (ACTOS) 45 MG tablet Take 1 tablet (45 mg total) by mouth daily. 90 tablet 3   prednisoLONE acetate (PRED FORTE) 1 % ophthalmic suspension Place 1 drop into both eyes daily as needed (eye irritation).     No facility-administered medications prior to visit.    No Known Allergies  ROS As per HPI  PE: Vitals with BMI 09/06/2020 08/22/2020 06/06/2020  Height '6\' 1"'$  (No Data) '6\' 0"'$   Weight 250 lbs 6 oz (No Data) 248 lbs 13 oz  BMI [id number] - [id number]  Systolic 123XX123 (No Data) [id number]  Diastolic 84 (No Data) 73  Pulse 68 - 81  Manual bp repeat at end of visit: 132/76  Gen: Alert, well appearing.  Patient is oriented to person, place, time, and situation. AFFECT: pleasant, lucid thought and speech. CV: RRR, no m/r/g.   LUNGS: CTA bilat, nonlabored resps, good aeration in all lung fields. EXT: no clubbing or cyanosis.  no edema.    LABS:  Lab Results  Component Value Date   TSH 0.84 07/24/2017   Lab Results  Component Value Date   WBC 4.0 03/07/2020   HGB 11.3 (L) 03/07/2020   HCT 36.7 (L) 03/07/2020   MCV 62.7 (L) 03/07/2020   PLT 260 03/07/2020   Lab Results  Component Value Date   IRON 187 (H) 07/09/2016    FERRITIN 260.1 07/09/2016   No results found for: VITAMINB12  Lab Results  Component Value Date   CREATININE 1.21 06/14/2020   BUN 21 06/14/2020   NA 138 06/14/2020   K 4.3 06/14/2020   CL 103 06/14/2020   CO2 24 06/14/2020   Lab Results  Component Value Date   ALT 12 06/06/2020   AST 12 06/06/2020   ALKPHOS 48 07/08/2016   BILITOT 1.0 06/06/2020   Lab Results  Component Value Date   CHOL 106 03/07/2020   Lab Results  Component Value Date   HDL 34 (L) 03/07/2020  Lab Results  Component Value Date   LDLCALC 51 03/07/2020   Lab Results  Component Value Date   TRIG 131 03/07/2020   Lab Results  Component Value Date   CHOLHDL 3.1 03/07/2020   Lab Results  Component Value Date   PSA 0.93 05/31/2019   PSA 0.86 02/01/2018   PSA 0.73 01/16/2017   Lab Results  Component Value Date   HGBA1C 6.4 (H) 06/06/2020   IMPRESSION AND PLAN:  1) DM 2, historically well controlled on metformin and pioglitazone. Hba1c and lytes/cr today.  2) HTN: well controlled on lisinopril '10mg'$  qd, hctz 25 qd, and amlod 10 qd. Lytes/cr today.  3) HLD: tolerating atorva 20 qd. FLP today. Hepatic panel normal 3 mo ago.  4) CRI III: he avoids regular use of nsaids, is working on better hydration habits. Lytes/cr today.  An After Visit Summary was printed and given to the patient.  FOLLOW UP: Return in about 3 months (around 12/07/2020) for routine chronic illness f/u. Next cpe 05/2021  Signed:  Crissie Sickles, MD           09/06/2020

## 2020-09-07 LAB — LIPID PANEL
Cholesterol: 110 mg/dL (ref <200)
HDL: 41 mg/dL (ref >=40)
LDL Cholesterol (Calc): 47 mg/dL (calc)
Non-HDL Cholesterol (Calc): 69 mg/dL (calc) (ref <130)
Total CHOL/HDL Ratio: 2.7 (calc) (ref <5.0)
Triglycerides: 138 mg/dL (ref <150)

## 2020-09-07 LAB — BASIC METABOLIC PANEL
BUN/Creatinine Ratio: 15 (calc) (ref 6–22)
BUN: 19 mg/dL (ref 7–25)
CO2: 23 mmol/L (ref 20–32)
Calcium: 9.2 mg/dL (ref 8.6–10.3)
Chloride: 106 mmol/L (ref 98–110)
Creat: 1.29 mg/dL — ABNORMAL HIGH (ref 0.70–1.28)
Glucose, Bld: 139 mg/dL — ABNORMAL HIGH (ref 65–99)
Potassium: 4.2 mmol/L (ref 3.5–5.3)
Sodium: 139 mmol/L (ref 135–146)

## 2020-09-07 LAB — HEMOGLOBIN A1C
Hgb A1c MFr Bld: 6.4 % of total Hgb — ABNORMAL HIGH (ref <5.7)
Mean Plasma Glucose: 137 mg/dL
eAG (mmol/L): 7.6 mmol/L

## 2020-10-04 ENCOUNTER — Other Ambulatory Visit: Payer: Self-pay | Admitting: Family Medicine

## 2020-10-14 IMAGING — DX DG FOOT COMPLETE 3+V*R*
3 series · 3 of 3 positions shown · non-contrast
Comparison: None.

CLINICAL DATA: RIGHT foot pain for 2 weeks. RIGHT foot injury 2
weeks ago.

EXAM:
RIGHT FOOT COMPLETE - 3+ VIEW

[foot ap]
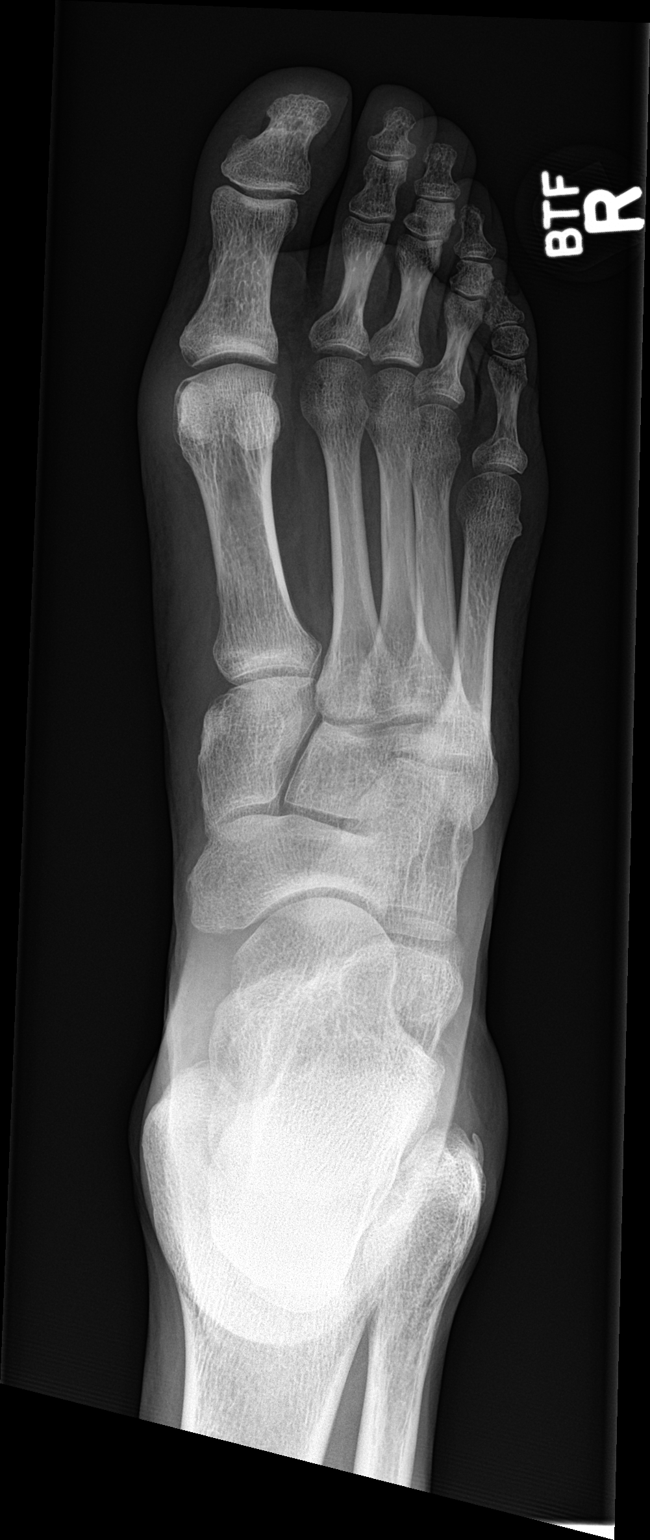

[foot obl]
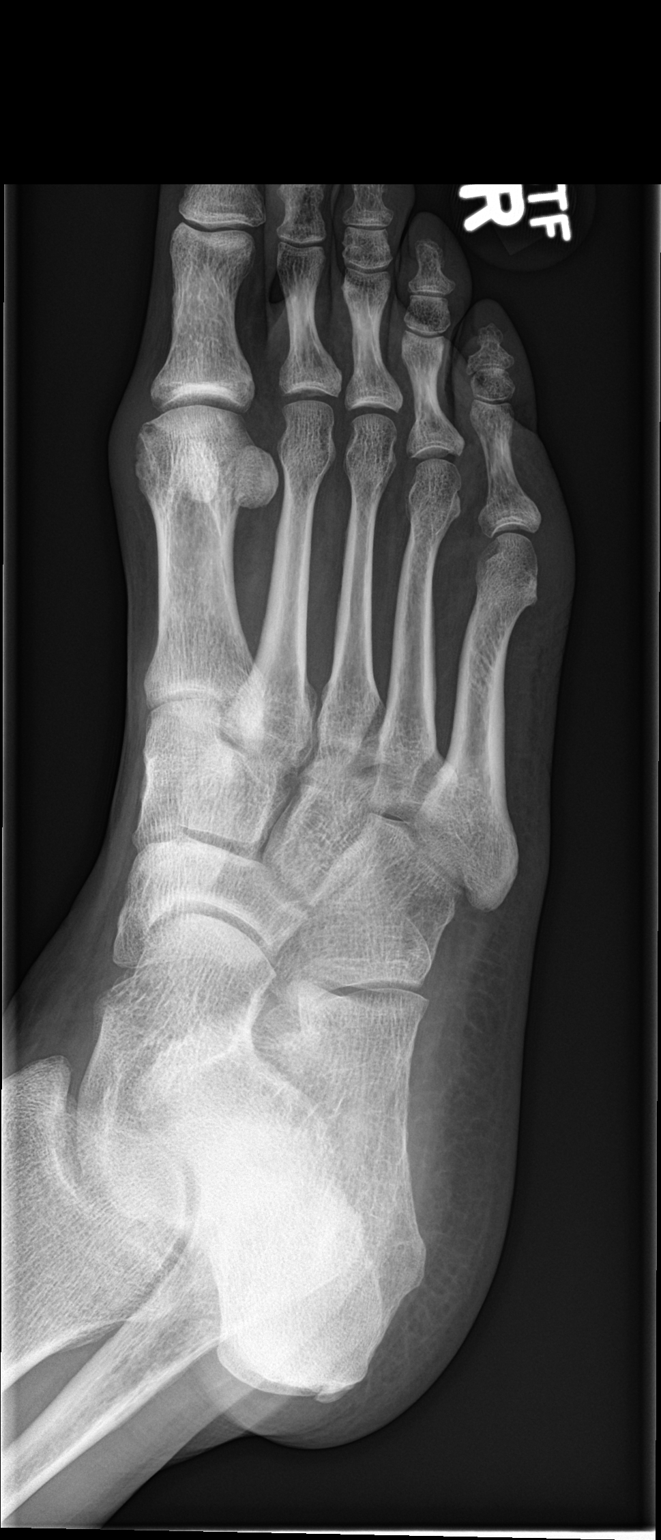

[foot lat]
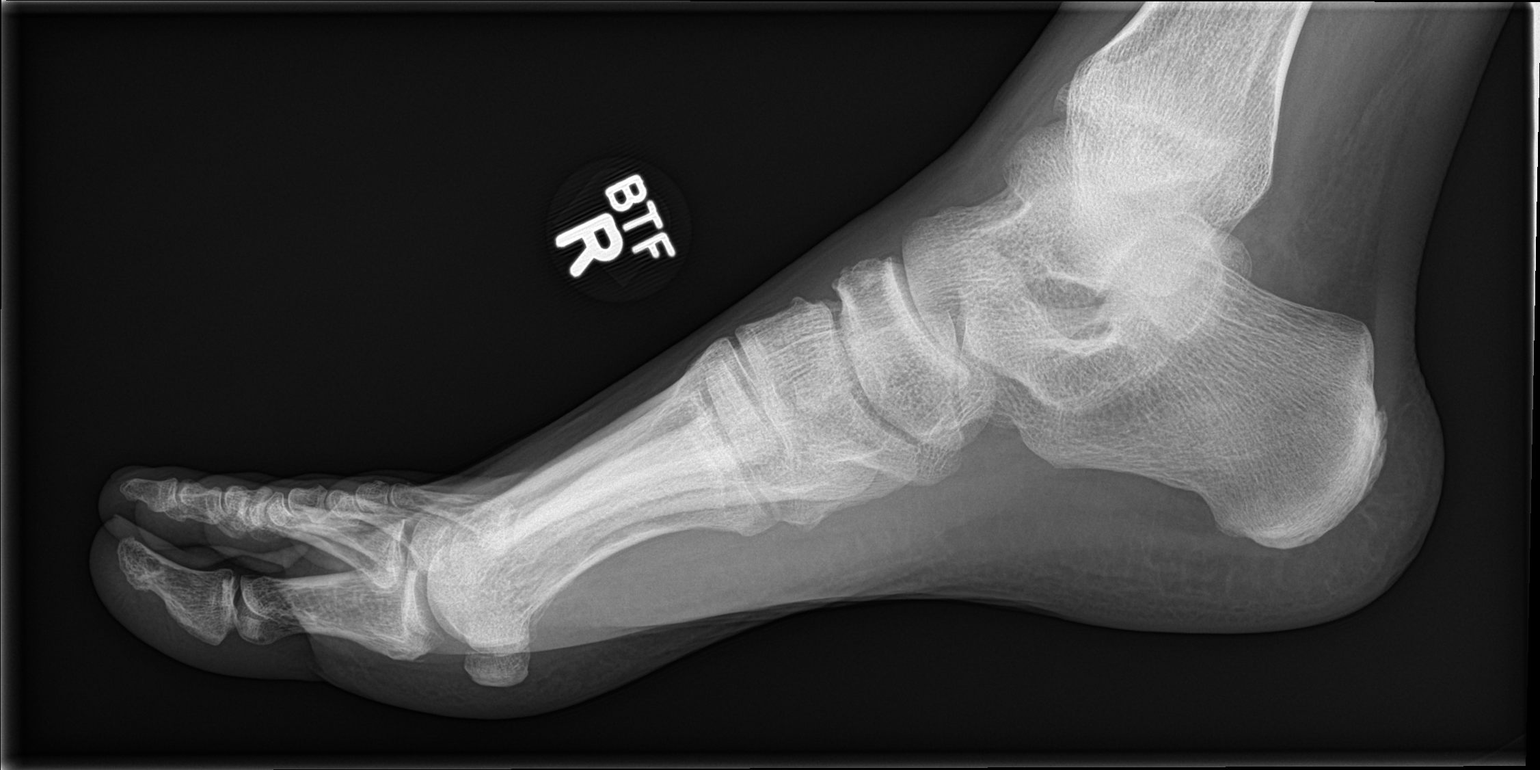

[3 of 3 positions shown; findings below may reference images not displayed]

FINDINGS: Osseous alignment is normal. Bone mineralization is normal. No
fracture line or displaced fracture fragment seen. No significant
degenerative change seen. Soft tissues about the RIGHT foot are
unremarkable.
IMPRESSION: Normal plain film examination of the RIGHT foot.

## 2020-10-18 DIAGNOSIS — H02885 Meibomian gland dysfunction left lower eyelid: Secondary | ICD-10-CM | POA: Diagnosis not present

## 2020-10-18 DIAGNOSIS — H02882 Meibomian gland dysfunction right lower eyelid: Secondary | ICD-10-CM | POA: Diagnosis not present

## 2020-10-18 DIAGNOSIS — Z961 Presence of intraocular lens: Secondary | ICD-10-CM | POA: Diagnosis not present

## 2020-10-18 DIAGNOSIS — E119 Type 2 diabetes mellitus without complications: Secondary | ICD-10-CM | POA: Diagnosis not present

## 2020-10-18 DIAGNOSIS — H35363 Drusen (degenerative) of macula, bilateral: Secondary | ICD-10-CM | POA: Diagnosis not present

## 2020-10-18 LAB — HM DIABETES EYE EXAM

## 2020-10-20 ENCOUNTER — Other Ambulatory Visit: Payer: Self-pay | Admitting: Family Medicine

## 2020-10-22 NOTE — Telephone Encounter (Signed)
Last refill completed 08/08/20(90,1) to [facility]

## 2020-11-05 DIAGNOSIS — M5431 Sciatica, right side: Secondary | ICD-10-CM | POA: Diagnosis not present

## 2020-11-05 DIAGNOSIS — M9903 Segmental and somatic dysfunction of lumbar region: Secondary | ICD-10-CM | POA: Diagnosis not present

## 2020-11-07 DIAGNOSIS — M5431 Sciatica, right side: Secondary | ICD-10-CM | POA: Diagnosis not present

## 2020-11-07 DIAGNOSIS — M9903 Segmental and somatic dysfunction of lumbar region: Secondary | ICD-10-CM | POA: Diagnosis not present

## 2020-11-12 ENCOUNTER — Ambulatory Visit (INDEPENDENT_AMBULATORY_CARE_PROVIDER_SITE_OTHER): Payer: Medicare HMO | Admitting: Family Medicine

## 2020-11-12 ENCOUNTER — Other Ambulatory Visit: Payer: Self-pay

## 2020-11-12 ENCOUNTER — Encounter: Payer: Self-pay | Admitting: Family Medicine

## 2020-11-12 VITALS — BP 126/72 | HR 90 | Temp 97.7°F | Resp 16 | Ht 73.0 in | Wt 251.2 lb

## 2020-11-12 DIAGNOSIS — M79671 Pain in right foot: Secondary | ICD-10-CM

## 2020-11-12 DIAGNOSIS — N183 Chronic kidney disease, stage 3 unspecified: Secondary | ICD-10-CM

## 2020-11-12 DIAGNOSIS — N179 Acute kidney failure, unspecified: Secondary | ICD-10-CM | POA: Diagnosis not present

## 2020-11-12 DIAGNOSIS — M109 Gout, unspecified: Secondary | ICD-10-CM

## 2020-11-12 MED ORDER — PREDNISONE 20 MG PO TABS: ORAL_TABLET | ORAL | 0 refills | Status: DC

## 2020-11-12 NOTE — Telephone Encounter (Signed)
A user error has taken place: encounter opened in error, closed for administrative reasons.

## 2020-11-12 NOTE — Progress Notes (Signed)
OFFICE VISIT  11/12/2020  CC:  Chief Complaint  Patient presents with   Gout     Flare up in R foot around big toe thru joint, x 1 week. Has used ibuprofen    HPI:    Patient is a 70 y.o. Caucasian male who presents for right foot pain. Acute onset about 1 wk ago, pain across top of R midfoot and R MTP jt. Waxing and waning intensity.  Taking 600mg  ibup about once a day and it helps a little.  Has had gout in R foot in the past--2 times.   Past Medical History:  Diagnosis Date   Carpal tunnel syndrome on left    Cataract 07/2015   R eye--surgery recommended by his eye MD 08/03/15   Chronic fatigue    Chronic renal insufficiency, stage 3 (moderate) ([place]) 2021   Cr 1.2-1.4, GFR 50s-60s   Closed fracture of greater tuberosity of right humerus 02/28/2019   Immobil w/sling (Dr. Mardelle Matte)   Colon cancer screening 02/15/2018   iFOB neg 02/2018 and 06/2020.   Erectile dysfunction    Dr. Gaynelle Arabian.  Test[facility] 04/2016 at [facility].   Gout of big toe 1990s   only one episode; he cut back on peanut butter and hasn't had any prob since   Herpes simplex ophthalmicus    L eye, has opthalmologist, uses prednisone drops prn   History of gallstones    per pt report   History of kidney stones    History of prostatitis 1980s   Remote hx of seeing Dr. Terance Hart at [facility]--then Dr. Gaynelle Arabian.    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 Dr. Denna Haggard   Shingles    recurrent L thorax   Sinus disease    Thalassemia    Mild anemia with MCV 60s (alpha thal minor or beta thal trait: Hb electrophoresis not done/no result available.).   Type 2 diabetes mellitus with microalbuminuria ([place]) Dx'd 08/2013    Past Surgical History:  Procedure Laterality Date   CHOLECYSTECTOMY N/A 01/26/2020   Procedure: LAPAROSCOPIC CHOLECYSTECTOMY;  Surgeon: Coralie Keens, MD;  Location: [facility];   Service: General;  Laterality: N/A;   NASAL POLYP [place] EXTRACTION      Outpatient Medications Prior to Visit  Medication Sig Dispense Refill   acetaminophen (TYLENOL) 500 MG tablet Take 500-1,000 mg by mouth every 6 (six) hours as needed (pain.).     amLODipine (NORVASC) 10 MG tablet TAKE 1 TABLET DAILY 90 tablet 1   atorvastatin (LIPITOR) 20 MG tablet Take 1 tablet (20 mg total) by mouth daily. 90 tablet 3   cetirizine (ZYRTEC) 10 MG tablet Take 10 mg by mouth daily.     cholestyramine (QUESTRAN) 4 g packet Take 4 g by mouth 3 (three) times daily as needed.     glucose blood test strip Use as instructed 100 each 6   hydrochlorothiazide (HYDRODIURIL) 25 MG tablet TAKE 1 TABLET DAILY 90 tablet 1   hydrocortisone valerate ointment (WEST-CORT) 0.2 % Appy bid to affected area prn 15 g 2   Lancets (FREESTYLE) lancets Use as instructed 100 each 12   lisinopril (ZESTRIL) 10 MG tablet Take 1 tablet (10 mg total) by mouth daily. 90 tablet 1   metFORMIN (GLUCOPHAGE) 1000 MG tablet TAKE 1 TABLET TWICE DAILY  WITH MEALS 180 tablet 1   phenylephrine (4-WAY FAST ACTING)  1 % nasal spray Place 1 drop into both nostrils every 6 (six) hours as needed for congestion.     pioglitazone (ACTOS) 45 MG tablet Take 1 tablet (45 mg total) by mouth daily. 90 tablet 3   prednisoLONE acetate (PRED FORTE) 1 % ophthalmic suspension Place 1 drop into both eyes daily as needed (eye irritation).     minocycline (MINOCIN) 50 MG capsule Take by mouth daily.     No facility-administered medications prior to visit.    No Known Allergies  ROS As per HPI  PE: Vitals with BMI 11/12/2020 09/06/2020 08/22/2020  Height 6\' 1"  6\' 1"  (No Data)  Weight 251 lbs 3 oz 250 lbs 6 oz (No Data)  BMI 03.88 82.80 -  Systolic 034 917 (No Data)  Diastolic 72 84 (No Data)  Pulse 90 68 -   Gen: Alert, well appearing.  Patient is oriented to person, place, time, and situation. AFFECT: pleasant, lucid thought and  speech. R foot with mild TTP over dorsal midfoot and dorsal aspect of 1st MTP jt. ROM of ankle and toes intact, with toe extension eliciting some pain. No plantar fascia/heel tenderness.  No skin color changes to R foot or toes, no excessive warmth.  LABS:    Chemistry      Component Value Date/Time   NA 139 09/06/2020 0852   K 4.2 09/06/2020 0852   CL 106 09/06/2020 0852   CO2 23 09/06/2020 0852   BUN 19 09/06/2020 0852   CREATININE 1.29 (H) 09/06/2020 0852      Component Value Date/Time   CALCIUM 9.2 09/06/2020 0852   ALKPHOS 48 07/08/2016 0846   AST 12 06/06/2020 0829   ALT 12 06/06/2020 0829   BILITOT 1.0 06/06/2020 0829     No results found for: LABURIC  IMPRESSION AND PLAN:  1) R midfoot and MTP pain: his sx's are subtle but gouty arthritis is my working dx. Check uric acid today.  Low purine diet reviewed and handout given. Prednisone 40mg  qd x 5d rx'd. Tylenol 1000 q6h prn pain.  Stop ibup. He has had very infrequent flares --no prophylactic med recommended at this time.  2) CRI III: he had a bump in sCr 6 mo ago, subsequent recheck 1 wk later after improved hydration showed improvement to 1.29--not quite back to his baseline of 1.1-1.2.  Recheck bmet today. He is on lisinopril and hct: if further drop in sCr then I'll cut back on one/both of these and check renal ultrasound.  An After Visit Summary was printed and given to the patient.  FOLLOW UP: Return for keep already scheduled f/ut appt 12/06/20.  Signed:  Crissie Sickles, MD           11/12/2020

## 2020-11-12 NOTE — Patient Instructions (Signed)
Low-Purine Eating Plan A low-purine eating plan involves making food choices to limit your intake of purine. Purine is a kind of uric acid. Too much uric acid in your blood can cause certain conditions, such as gout and kidney stones. Eating a low-purinediet can help control these conditions. What are tips for following this plan? Reading food labels Avoid foods with saturated or Trans fat. Check the ingredient list of grains-based foods, such as bread and cereal, to make sure that they contain whole grains. Check the ingredient list of sauces or soups to make sure they do not contain meat or fish. When choosing soft drinks, check the ingredient list to make sure they do not contain high-fructose corn syrup. Shopping  Buy plenty of fresh fruits and vegetables. Avoid buying canned or fresh fish. Buy dairy products labeled as low-fat or nonfat. Avoid buying premade or processed foods. These foods are often high in fat, salt (sodium), and added sugar.  Cooking Use olive oil instead of butter when cooking. Oils like olive oil, canola oil, and sunflower oil contain healthy fats. Meal planning Learn which foods do or do not affect you. If you find out that a food tends to cause your gout symptoms to flare up, avoid eating that food. You can enjoy foods that do not cause problems. If you have any questions about a food item, talk with your dietitian or health care provider. Limit foods high in fat, especially saturated fat. Fat makes it harder for your body to get rid of uric acid. Choose foods that are lower in fat and are lean sources of protein. General guidelines Limit alcohol intake to no more than 1 drink a day for nonpregnant women and 2 drinks a day for men. One drink equals 12 oz of beer, 5 oz of wine, or 1 oz of hard liquor. Alcohol can affect the way your body gets rid of uric acid. Drink plenty of water to keep your urine clear or pale yellow. Fluids can help remove uric acid from your  body. If directed by your health care provider, take a vitamin C supplement. Work with your health care provider and dietitian to develop a plan to achieve or maintain a healthy weight. Losing weight can help reduce uric acid in your blood. What foods are recommended? The items listed may not be a complete list. Talk with your dietitian aboutwhat dietary choices are best for you. Foods low in purines Foods low in purines do not need to be limited. These include: All fruits. All low-purine vegetables, pickles, and olives. Breads, pasta, rice, cornbread, and popcorn. Cake and other baked goods. All dairy foods. Eggs, nuts, and nut butters. Spices and condiments, such as salt, herbs, and vinegar. Plant oils, butter, and margarine. Water, sugar-free soft drinks, tea, coffee, and cocoa. Vegetable-based soups, broths, sauces, and gravies. Foods moderate in purines Foods moderate in purines should be limited to the amounts listed.  cup of asparagus, cauliflower, spinach, mushrooms, or green peas, each day. 2/3 cup uncooked oatmeal, each day.  cup dry wheat bran or wheat germ, each day. 2-3 ounces of meat or poultry, each day. 4-6 ounces of shellfish, such as crab, lobster, oysters, or shrimp, each day. 1 cup cooked beans, peas, or lentils, each day. Soup, broths, or bouillon made from meat or fish. Limit these foods as much as possible. What foods are not recommended? The items listed may not be a complete list. Talk with your dietitian aboutwhat dietary choices are best for you.   Limit your intake of foods high in purines, including: Beer and other alcohol. Meat-based gravy or sauce. Canned or fresh fish, such as: Anchovies, sardines, herring, and tuna. Mussels and scallops. Codfish, trout, and haddock. Bacon. Organ meats, such as: Liver or kidney. Tripe. Sweetbreads (thymus gland or pancreas). Wild game or goose. Yeast or yeast extract supplements. Drinks sweetened with  high-fructose corn syrup. Summary Eating a low-purine diet can help control conditions caused by too much uric acid in the body, such as gout or kidney stones. Choose low-purine foods, limit alcohol, and limit foods high in fat. You will learn over time which foods do or do not affect you. If you find out that a food tends to cause your gout symptoms to flare up, avoid eating that food. This information is not intended to replace advice given to you by your health care provider. Make sure you discuss any questions you have with your healthcare provider. Document Revised: 05/12/2019 Document Reviewed: 05/12/2019 Elsevier Patient Education  2022 Elsevier Inc.  

## 2020-11-13 LAB — BASIC METABOLIC PANEL
BUN: 18 mg/dL (ref 7–25)
CO2: 23 mmol/L (ref 20–32)
Calcium: 9.5 mg/dL (ref 8.6–10.3)
Chloride: 105 mmol/L (ref 98–110)
Creat: 1.17 mg/dL (ref 0.70–1.28)
Glucose, Bld: 124 mg/dL — ABNORMAL HIGH (ref 65–99)
Potassium: 4.2 mmol/L (ref 3.5–5.3)
Sodium: 139 mmol/L (ref 135–146)

## 2020-11-13 LAB — URIC ACID: Uric Acid, Serum: 8.7 mg/dL — ABNORMAL HIGH (ref 4.0–8.0)

## 2020-11-14 DIAGNOSIS — M5431 Sciatica, right side: Secondary | ICD-10-CM | POA: Diagnosis not present

## 2020-11-14 DIAGNOSIS — M9903 Segmental and somatic dysfunction of lumbar region: Secondary | ICD-10-CM | POA: Diagnosis not present

## 2020-11-15 ENCOUNTER — Other Ambulatory Visit: Payer: Self-pay | Admitting: Family Medicine

## 2020-11-21 DIAGNOSIS — M5431 Sciatica, right side: Secondary | ICD-10-CM | POA: Diagnosis not present

## 2020-11-21 DIAGNOSIS — M9903 Segmental and somatic dysfunction of lumbar region: Secondary | ICD-10-CM | POA: Diagnosis not present

## 2020-11-28 ENCOUNTER — Other Ambulatory Visit: Payer: Self-pay | Admitting: Family Medicine

## 2020-11-28 DIAGNOSIS — M9903 Segmental and somatic dysfunction of lumbar region: Secondary | ICD-10-CM | POA: Diagnosis not present

## 2020-11-28 DIAGNOSIS — M5431 Sciatica, right side: Secondary | ICD-10-CM | POA: Diagnosis not present

## 2020-11-28 DIAGNOSIS — H02885 Meibomian gland dysfunction left lower eyelid: Secondary | ICD-10-CM | POA: Diagnosis not present

## 2020-11-28 DIAGNOSIS — H02882 Meibomian gland dysfunction right lower eyelid: Secondary | ICD-10-CM | POA: Diagnosis not present

## 2020-11-28 DIAGNOSIS — H01004 Unspecified blepharitis left upper eyelid: Secondary | ICD-10-CM | POA: Diagnosis not present

## 2020-11-28 DIAGNOSIS — H01001 Unspecified blepharitis right upper eyelid: Secondary | ICD-10-CM | POA: Diagnosis not present

## 2020-12-06 ENCOUNTER — Other Ambulatory Visit: Payer: Self-pay

## 2020-12-06 ENCOUNTER — Ambulatory Visit (INDEPENDENT_AMBULATORY_CARE_PROVIDER_SITE_OTHER): Payer: Medicare HMO | Admitting: Family Medicine

## 2020-12-06 VITALS — BP 128/73 | HR 71 | Temp 97.6°F | Ht 73.0 in | Wt 250.6 lb

## 2020-12-06 DIAGNOSIS — I10 Essential (primary) hypertension: Secondary | ICD-10-CM

## 2020-12-06 DIAGNOSIS — N1831 Chronic kidney disease, stage 3a: Secondary | ICD-10-CM

## 2020-12-06 DIAGNOSIS — E1121 Type 2 diabetes mellitus with diabetic nephropathy: Secondary | ICD-10-CM

## 2020-12-06 LAB — POCT GLYCOSYLATED HEMOGLOBIN (HGB A1C)
HbA1c POC (<> result, manual entry): 6.4 % (ref 4.0–5.6)
HbA1c, POC (controlled diabetic range): 6.4 % (ref 0.0–7.0)
HbA1c, POC (prediabetic range): 6.4 % (ref 5.7–6.4)
Hemoglobin A1C: 6.4 % — AB (ref 4.0–5.6)

## 2020-12-06 NOTE — Progress Notes (Signed)
I personally was present during the history, physical exam, and medical decision-making activities of this service and have verified that the service and findings are accurately documented in the student's note. Signed:  Crissie Sickles, MD           12/06/2020

## 2020-12-06 NOTE — Progress Notes (Addendum)
OFFICE VISIT  12/06/2020  CC:  Chief Complaint  Patient presents with   Follow-up    RCI,  pt is fasting    HPI:    Patient is a 70 y.o. male who presents for 3 mo f/u DM 2, HTN, CRI III, and recent episode of gouty arthritis that I saw him for on 11/12/20.   Patient says he is aware of his Hba1c levels and mentions medical adherence to his diabetic regime. He understands signs and symptoms of poor glucose control (dehydration, frequent urination, thirst) and knows how to properly balance his meals to control his diabetes. Admits to not checking his sugar as often as he should. Patient denies symptoms of headache, change in vision, or head bobbing that would insinuate uncontrolled hypertension. Goat was also discussed. The patient is aware of diet measures he can take to control it and has a prescription of prednisone that adequately controls his symptoms. His gout flares occur in his foot and only occur once or twice a year.   Patient is tolerating all medications well and is working on better hydration habits. ROS negative (cough, congestion, fever, sweats, joint pain/tenderness, change in vision, headache, head bobbing, decreased sensation/motor fxn, increased urination, dehydration, fatigue).  INTERIM HX: His uric acid level was up (8.7) last visit when he had suspected gout in foot. Renal function was back to his baseline. Glucose is elevated at 124.   Past Medical History:  Diagnosis Date   Carpal tunnel syndrome on left    Cataract 07/2015   R eye--surgery recommended by his eye MD 08/03/15   Chronic fatigue    Chronic renal insufficiency, stage 3 (moderate) ([place]) 2021   Cr 1.2-1.4, GFR 50s-60s   Closed fracture of greater tuberosity of right humerus 02/28/2019   Immobil w/sling (Dr. Mardelle Matte)   Colon cancer screening 02/15/2018   iFOB neg 02/2018 and 06/2020.   Erectile dysfunction    Dr. Gaynelle Arabian.  Testosterone normal 04/2016 at [facility].   Gout of big toe 1990s   only one  episode; he cut back on peanut butter and hasn't had any prob since   Herpes simplex ophthalmicus    L eye, has opthalmologist, uses prednisone drops prn   History of gallstones    per pt report   History of kidney stones    History of prostatitis 1980s   Remote hx of seeing Dr. Terance Hart at [facility]--then Dr. Gaynelle Arabian.    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 Dr. Denna Haggard   Shingles    recurrent L thorax   Sinus disease    Thalassemia    Mild anemia with MCV 60s (alpha thal minor or beta thal trait: Hb electrophoresis not done/no result available.).   Type 2 diabetes mellitus with microalbuminuria ([place]) Dx'd 08/2013    Past Surgical History:  Procedure Laterality Date   CHOLECYSTECTOMY N/A 01/26/2020   Procedure: LAPAROSCOPIC CHOLECYSTECTOMY;  Surgeon: Coralie Keens, MD;  Location: [facility];  Service: General;  Laterality: N/A;   NASAL POLYP EXCISION  1960   WISDOM TOOTH EXTRACTION      Outpatient Medications Prior to Visit  Medication Sig Dispense Refill   amLODipine (NORVASC) 10 MG tablet TAKE 1 TABLET DAILY 90 tablet 1   atorvastatin (LIPITOR) 20 MG tablet Take 1 tablet (20 mg total) by mouth daily. 90 tablet 3   cetirizine (ZYRTEC) 10 MG tablet Take 10 mg by  mouth daily.     cholestyramine (QUESTRAN) 4 g packet Take 4 g by mouth 3 (three) times daily as needed.     glucose blood test strip Use as instructed 100 each 6   hydrochlorothiazide (HYDRODIURIL) 25 MG tablet TAKE 1 TABLET DAILY 90 tablet 1   hydrocortisone valerate ointment (WEST-CORT) 0.2 % Appy bid to affected area prn 15 g 2   Lancets (FREESTYLE) lancets Use as instructed 100 each 12   lisinopril (ZESTRIL) 10 MG tablet Take 1 tablet (10 mg total) by mouth daily. 90 tablet 1   metFORMIN (GLUCOPHAGE) 1000 MG tablet TAKE 1 TABLET TWICE DAILY  WITH MEALS 180 tablet 1   minocycline (MINOCIN) 50 MG capsule  Take by mouth daily.     phenylephrine (4-WAY FAST ACTING) 1 % nasal spray Place 1 drop into both nostrils every 6 (six) hours as needed for congestion.     pioglitazone (ACTOS) 45 MG tablet Take 1 tablet (45 mg total) by mouth daily. 90 tablet 3   prednisoLONE acetate (PRED FORTE) 1 % ophthalmic suspension Place 1 drop into both eyes daily as needed (eye irritation).     acetaminophen (TYLENOL) 500 MG tablet Take 500-1,000 mg by mouth every 6 (six) hours as needed (pain.). (Patient not taking: Reported on 12/06/2020)     predniSONE (DELTASONE) 20 MG tablet 2 tabs po qd x 5d (Patient not taking: Reported on 12/06/2020) 10 tablet 0   No facility-administered medications prior to visit.    No Known Allergies  ROS As per HPI  PE: Vitals with BMI 12/06/2020 11/12/2020 09/06/2020  Height 6\' 1"  6\' 1"  6\' 1"   Weight 250 lbs 10 oz 251 lbs 3 oz 250 lbs 6 oz  BMI 33.07 42.70 62.37  Systolic 628 315 176  Diastolic 73 72 84  Pulse 71 90 68   Physical Exam Constitutional:      Appearance: Normal appearance.  Cardiovascular:     Rate and Rhythm: Normal rate and regular rhythm.     Pulses: Normal pulses.     Heart sounds: Normal heart sounds.  Pulmonary:     Effort: Pulmonary effort is normal.     Breath sounds: Normal breath sounds.  Neurological:     Mental Status: He is alert.  Psychiatric:        Mood and Affect: Mood normal.        Behavior: Behavior normal.        Thought Content: Thought content normal.   LABS Lab Results  Component Value Date   TSH 0.84 07/24/2017   Lab Results  Component Value Date   WBC 4.0 03/07/2020   HGB 11.3 (L) 03/07/2020   HCT 36.7 (L) 03/07/2020   MCV 62.7 (L) 03/07/2020   PLT 260 03/07/2020   Lab Results  Component Value Date   IRON 187 (H) 07/09/2016   FERRITIN 260.1 07/09/2016   No results found for: VITAMINB12  Lab Results  Component Value Date   CREATININE 1.17 11/12/2020   BUN 18 11/12/2020   NA 139 11/12/2020   K 4.2 11/12/2020    CL 105 11/12/2020   CO2 23 11/12/2020   Lab Results  Component Value Date   ALT 12 06/06/2020   AST 12 06/06/2020   ALKPHOS 48 07/08/2016   BILITOT 1.0 06/06/2020   Lab Results  Component Value Date   CHOL 110 09/06/2020   Lab Results  Component Value Date   HDL 41 09/06/2020   Lab Results  Component Value  Date   LDLCALC 47 09/06/2020   Lab Results  Component Value Date   TRIG 138 09/06/2020   Lab Results  Component Value Date   CHOLHDL 2.7 09/06/2020   Lab Results  Component Value Date   PSA 0.93 05/31/2019   PSA 0.86 02/01/2018   PSA 0.73 01/16/2017   Lab Results  Component Value Date   HGBA1C 6.4 (H) 09/06/2020   Lab Results  Component Value Date   LABURIC 8.7 (H) 11/12/2020    IMPRESSION AND PLAN:  Talked with the patient about signs that warrant a visit sooner than 3 months. Also talked about proper hydration. No medical changes today  1) DM 2 - well controlled on metformin and pioglitazone. Routine HbA1c drawn today. Adjust medication if necessary. POC a1c today 6.4%.   2) HTN - well controlled on lisinopril 10mg  qd, hctz 25 qd, and amlod 10 qd.  4) CRI III: he avoids regular use of nsaids, is working on better hydration habits. Latest creatinine stable at 1.17.  An After Visit Summary was printed and given to the patient.  FOLLOW UP: Return in about 3 months (around 03/08/2021) for routine chronic illness f/u.  Phil Dopp -- MS3  I personally was present during the history, physical exam, and medical decision-making activities of this service and have verified that the service and findings are accurately documented in the student's note.  Signed:  Crissie Sickles, MD           12/06/2020

## 2020-12-07 ENCOUNTER — Ambulatory Visit: Payer: Medicare HMO | Admitting: Family Medicine

## 2020-12-26 DIAGNOSIS — M9903 Segmental and somatic dysfunction of lumbar region: Secondary | ICD-10-CM | POA: Diagnosis not present

## 2020-12-26 DIAGNOSIS — M5431 Sciatica, right side: Secondary | ICD-10-CM | POA: Diagnosis not present

## 2021-01-27 IMAGING — DX DG SHOULDER 2+V*R*
3 series · 3 of 3 positions shown · non-contrast
Comparison: None.

CLINICAL DATA: Fell this morning and injured right shoulder.

EXAM:
RIGHT SHOULDER - 2+ VIEW

[shoulder grashey]
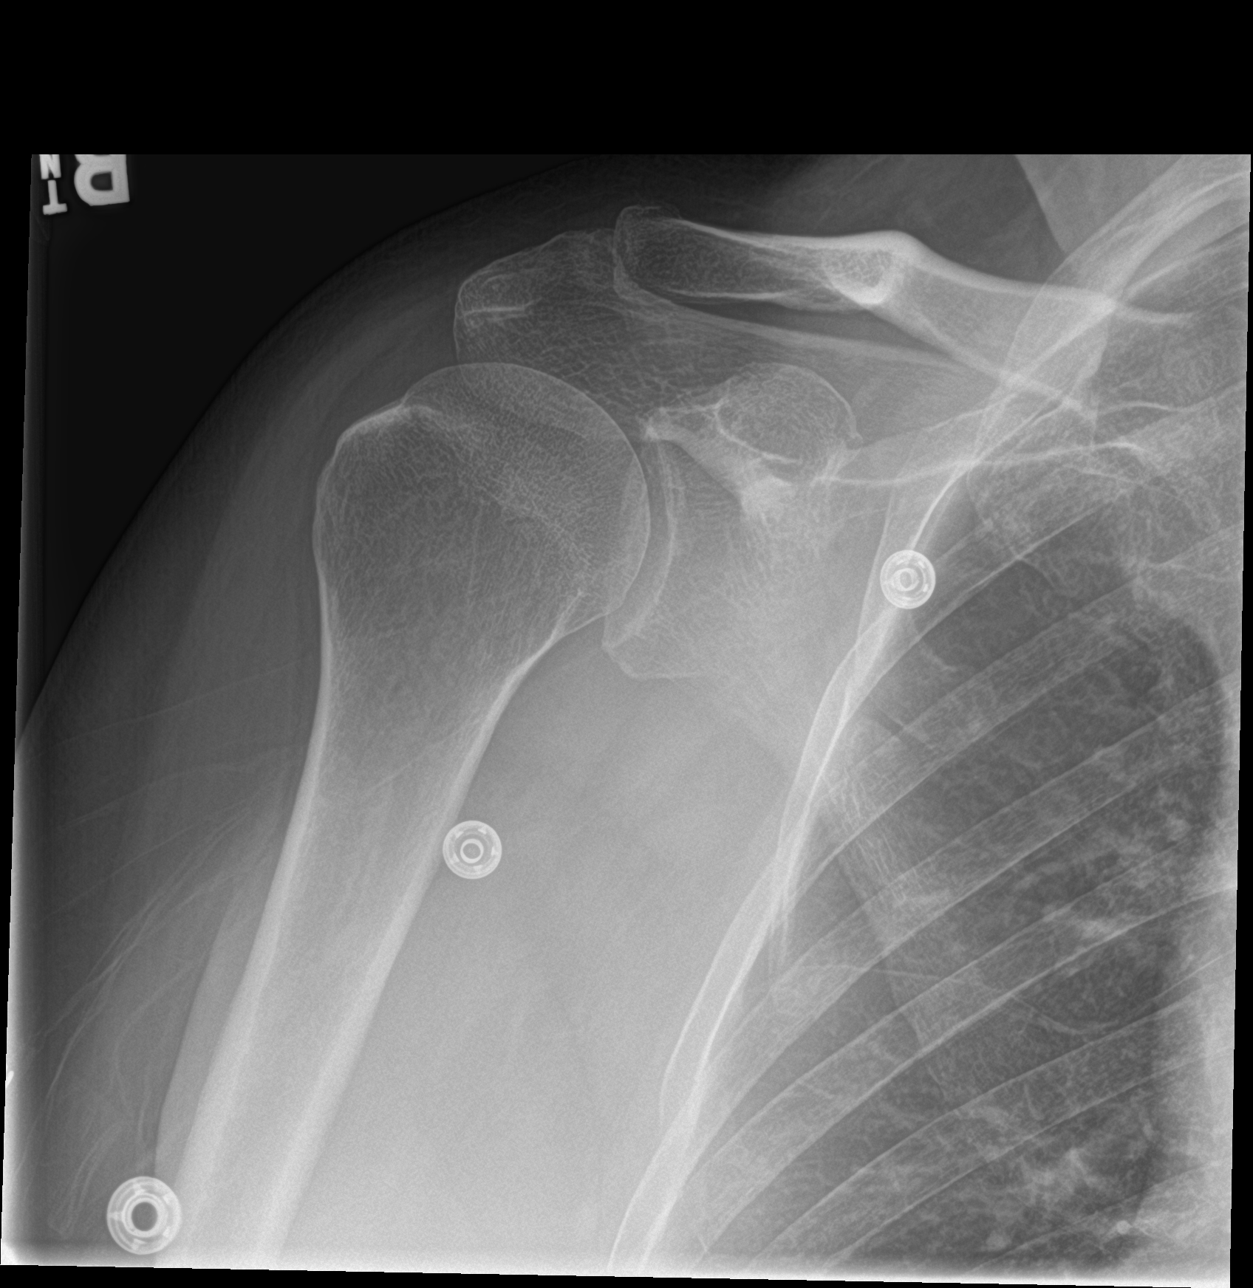

[shoulder y view]
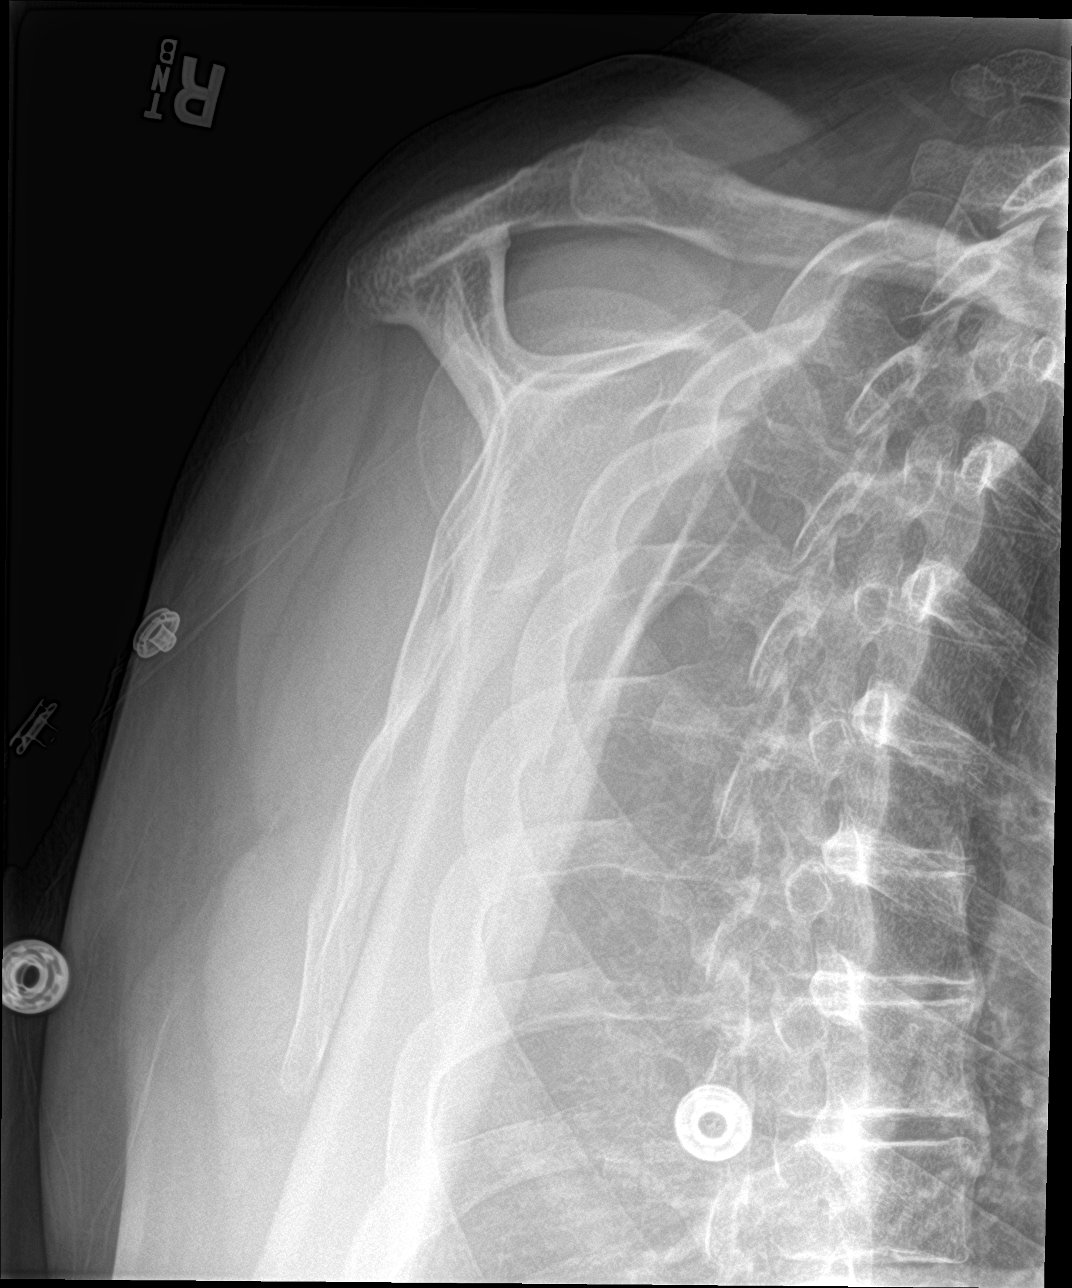

[shoulder axillary]
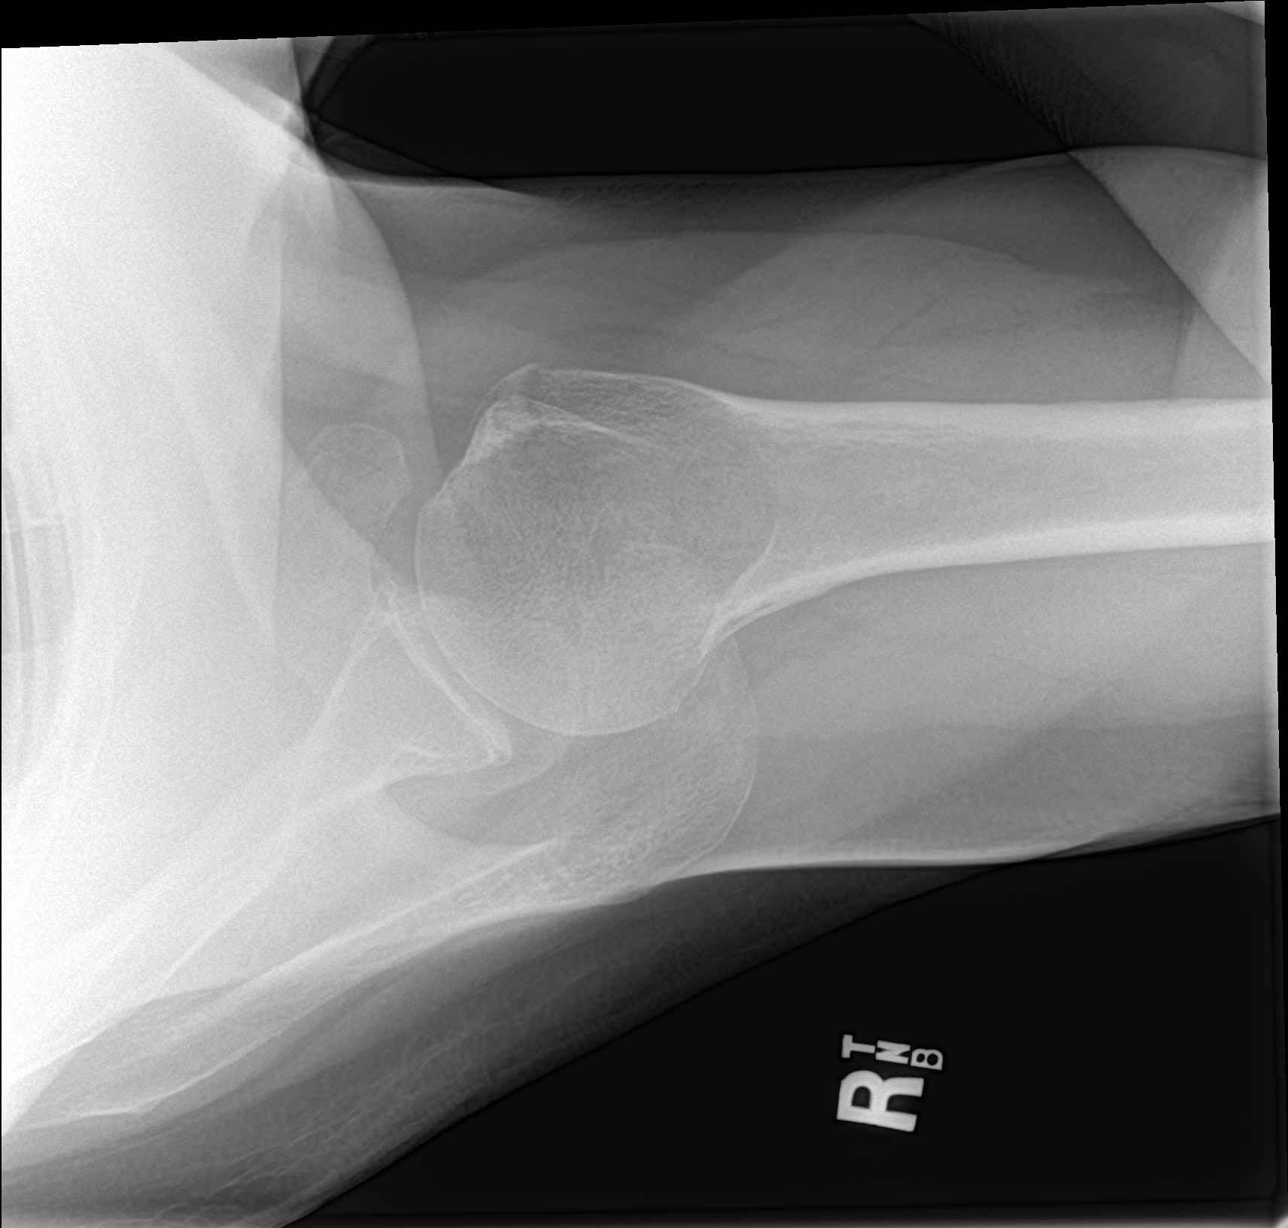

[3 of 3 positions shown; findings below may reference images not displayed]

FINDINGS: The AC joint and glenohumeral joints are maintained. Mild
degenerative changes. On the axillary view findings suspicious for a
small nondisplaced greater tuberosity fracture. No humeral neck
fracture is identified. The visualized right ribs are intact and the
visualized right lung is clear.
IMPRESSION: Suspect small nondisplaced greater tuberosity fracture.

## 2021-02-10 ENCOUNTER — Other Ambulatory Visit: Payer: Self-pay | Admitting: Family Medicine

## 2021-02-19 DIAGNOSIS — Z20822 Contact with and (suspected) exposure to covid-19: Secondary | ICD-10-CM | POA: Diagnosis not present

## 2021-02-20 ENCOUNTER — Other Ambulatory Visit: Payer: Self-pay | Admitting: Family Medicine

## 2021-03-06 ENCOUNTER — Other Ambulatory Visit: Payer: Self-pay

## 2021-03-07 ENCOUNTER — Ambulatory Visit: Payer: Medicare HMO | Admitting: Family Medicine

## 2021-03-19 ENCOUNTER — Other Ambulatory Visit: Payer: Self-pay

## 2021-03-20 ENCOUNTER — Encounter: Payer: Self-pay | Admitting: Family Medicine

## 2021-03-20 ENCOUNTER — Ambulatory Visit (INDEPENDENT_AMBULATORY_CARE_PROVIDER_SITE_OTHER): Payer: Medicare HMO | Admitting: Family Medicine

## 2021-03-20 VITALS — BP 133/75 | HR 70 | Temp 97.7°F | Ht 73.0 in | Wt 254.4 lb

## 2021-03-20 DIAGNOSIS — E1129 Type 2 diabetes mellitus with other diabetic kidney complication: Secondary | ICD-10-CM | POA: Diagnosis not present

## 2021-03-20 DIAGNOSIS — J329 Chronic sinusitis, unspecified: Secondary | ICD-10-CM | POA: Diagnosis not present

## 2021-03-20 DIAGNOSIS — I10 Essential (primary) hypertension: Secondary | ICD-10-CM | POA: Diagnosis not present

## 2021-03-20 DIAGNOSIS — R809 Proteinuria, unspecified: Secondary | ICD-10-CM

## 2021-03-20 DIAGNOSIS — E7800 Pure hypercholesterolemia, unspecified: Secondary | ICD-10-CM

## 2021-03-20 NOTE — Progress Notes (Addendum)
OFFICE VISIT  03/20/2021  CC:  Chief Complaint  Patient presents with   Follow-up    RCI; pt is fasting    HPI:    Patient is a 71 y.o. male who presents for 3 mo f/u DM 2, HTN, CRI III. A/P as of last visit: "1) DM 2 - well controlled on metformin and pioglitazone. Routine HbA1c drawn today. Adjust medication if necessary. POC a1c today 6.4%.   2) HTN - well controlled on lisinopril 10mg  qd, hctz 25 qd, and amlod 10 qd.   4) CRI III: he avoids regular use of nsaids, is working on better hydration habits. Latest creatinine stable at 1.17."  INTERIM HX: Chronic nasal congestion, sinus pressure, R>L---worse last 3 mo. Uses otc "4-way" spray for 30 yrs, zyrtec and other antihistamines alternating--longterm. Sinus HA on R often.  Home bp monitoring: consistentlyt <130/80, HR usually around 60.  No home glucose monitoring.  ROS as above, plus--> no fevers, no CP, no SOB, no wheezing, no cough, no dizziness, no HAs, no rashes, no melena/hematochezia.  No polyuria or polydipsia.  No myalgias or arthralgias.  No focal weakness, paresthesias, or tremors.  No acute vision or hearing abnormalities.  No dysuria or unusual/new urinary urgency or frequency.  No recent changes in lower legs. No n/v/d or abd pain.  No palpitations.     Past Medical History:  Diagnosis Date   Carpal tunnel syndrome on left    Cataract 07/2015   R eye--surgery recommended by his eye MD 08/03/15   Chronic fatigue    Chronic renal insufficiency, stage 3 (moderate) ([place]) 2021   Cr 1.2-1.4, GFR 50s-60s   Closed fracture of greater tuberosity of right humerus 02/28/2019   Immobil w/sling (Dr. Mardelle Matte)   Colon cancer screening 02/15/2018   iFOB neg 02/2018 and 06/2020.   Erectile dysfunction    Dr. Gaynelle Arabian.  Testosterone [facility] 04/2016 at [facility]   Gout of big toe 1990s   only one episode; he cut back on peanut butter and hasn't had any prob since   Herpes simplex ophthalmicus    L eye, has opthalmologist, uses  prednisone drops prn   History of gallstones    per pt report   History of kidney stones    History of prostatitis 1980s   Remote hx of seeing Dr. Terance Hart at [facility]--then Dr. Gaynelle Arabian.    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 Dr. Denna Haggard   Shingles    recurrent L thorax   Sinus disease    Thalassemia    Mild anemia with MCV 60s (alpha thal minor or beta thal trait: Hb electrophoresis not done/no result available.).   Type 2 diabetes mellitus with microalbuminuria ([place]) Dx'd 08/2013    Past Surgical History:  Procedure Laterality Date   CHOLECYSTECTOMY N/A 01/26/2020   Procedure: LAPAROSCOPIC CHOLECYSTECTOMY;  Surgeon: Coralie Keens, MD;  Location: [facility];  Service: General;  Laterality: N/A;   NASAL POLYP [place] EXTRACTION      Outpatient Medications Prior to Visit  Medication Sig Dispense Refill   acetaminophen (TYLENOL) 500 MG tablet Take 500-1,000 mg by mouth every 6 (six) hours as needed (pain.).     amLODipine (NORVASC) 10 MG tablet TAKE 1 TABLET DAILY 90 tablet 1   atorvastatin (LIPITOR) 20 MG tablet Take 1 tablet (20 mg total) by mouth daily. 90 tablet 3  cetirizine (ZYRTEC) 10 MG tablet Take 10 mg by mouth daily.     cholestyramine (QUESTRAN) 4 g packet Take 4 g by mouth once a week.     glucose blood test strip Use as instructed 100 each 6   hydrochlorothiazide (HYDRODIURIL) 25 MG tablet TAKE 1 TABLET DAILY 90 tablet 1   Lancets (FREESTYLE) lancets Use as instructed 100 each 12   lisinopril (ZESTRIL) 10 MG tablet TAKE 1 TABLET DAILY 90 tablet 1   metFORMIN (GLUCOPHAGE) 1000 MG tablet TAKE 1 TABLET TWICE DAILY  WITH MEALS 180 tablet 1   phenylephrine (4-WAY FAST ACTING) 1 % nasal spray Place 1 drop into both nostrils every 6 (six) hours as needed for congestion.     pioglitazone (ACTOS) 45 MG tablet Take 1 tablet (45 mg total)  by mouth daily. 90 tablet 3   hydrocortisone valerate ointment (WEST-CORT) 0.2 % Appy bid to affected area prn (Patient not taking: Reported on 03/20/2021) 15 g 2   prednisoLONE acetate (PRED FORTE) 1 % ophthalmic suspension Place 1 drop into both eyes daily as needed (eye irritation). (Patient not taking: Reported on 03/20/2021)     minocycline (MINOCIN) 50 MG capsule Take by mouth daily. (Patient not taking: Reported on 03/20/2021)     No facility-administered medications prior to visit.    No Known Allergies  ROS As per HPI  PE: Vitals with BMI 03/20/2021 12/06/2020 11/12/2020  Height 6\' 1"  6\' 1"  6\' 1"   Weight 254 lbs 6 oz 250 lbs 10 oz 251 lbs 3 oz  BMI 33.57 52.84 13.24  Systolic 401 027 253  Diastolic 75 73 72  Pulse 70 71 90     Physical Exam  VS: noted--normal. Gen: alert, NAD, NONTOXIC APPEARING. HEENT: eyes without injection, drainage, or swelling.  Ears: EACs clear, TMs with normal light reflex and landmarks.  Nose: Small amount of crusty exudate adherent to mildly injected mucosa, no signif edema of mucosa.  No purulent d/c.  No paranasal sinus TTP.  No facial swelling.  Throat and mouth without focal lesion.  No pharyngial swelling, erythema, or exudate.   Neck: supple, no LAD.   LUNGS: CTA bilat, nonlabored resps.   CV: RRR (rate 70 by me), no m/r/g. EXT: no c/c/e SKIN: no rash   LABS:  Last CBC Lab Results  Component Value Date   WBC 4.0 03/07/2020   HGB 11.3 (L) 03/07/2020   HCT 36.7 (L) 03/07/2020   MCV 62.7 (L) 03/07/2020   MCH 19.3 (L) 03/07/2020   RDW 17.1 (H) 03/07/2020   PLT 260 66/44/0347   Last metabolic panel Lab Results  Component Value Date   GLUCOSE 124 (H) 11/12/2020   NA 139 11/12/2020   K 4.2 11/12/2020   CL 105 11/12/2020   CO2 23 11/12/2020   BUN 18 11/12/2020   CREATININE 1.17 11/12/2020   GFRNONAA 59 09/05/2020   CALCIUM 9.5 11/12/2020   PROT 7.2 06/06/2020   ALBUMIN 4.6 07/08/2016   BILITOT 1.0 06/06/2020   ALKPHOS 48  07/08/2016   AST 12 06/06/2020   ALT 12 06/06/2020   ANIONGAP 13 01/24/2020   Last lipids Lab Results  Component Value Date   CHOL 110 09/06/2020   HDL 41 09/06/2020   LDLCALC 47 09/06/2020   TRIG 138 09/06/2020   CHOLHDL 2.7 09/06/2020   Last hemoglobin A1c Lab Results  Component Value Date   HGBA1C 6.4 (A) 12/06/2020   HGBA1C 6.4 12/06/2020   HGBA1C 6.4 12/06/2020   HGBA1C  6.4 12/06/2020   Last thyroid functions Lab Results  Component Value Date   TSH 0.84 07/24/2017   Lab Results  Component Value Date   PSA 0.93 05/31/2019   PSA 0.86 02/01/2018   PSA 0.73 01/16/2017   IMPRESSION AND PLAN:  1) DM 2 - well controlled on metformin and pioglitazone. Last a1c was 6.5%. Recheck a1c today.  2) HTN - well controlled on lisinopril 10mg  qd, hctz 25 qd, and amlod 10 qd. Electrolytes and creatinine today.  3) CRI III: he avoids regular use of nsaids, is working on better hydration habits. Electrolytes and creatinine today.  4) chronic sinusitis.  Patient reports a long history of this, not much response to daily antihistamine or various nasal sprays in the past. He saw an ENT at least 2 decades ago, is interested in seeing 1 again to see if anything new to offer.  Referral made today.  An After Visit Summary was printed and given to the patient.  FOLLOW UP: Return in about 3 months (around 06/17/2021) for annual CPE (fasting).  Signed:  Crissie Sickles, MD           03/20/2021

## 2021-03-21 LAB — COMPREHENSIVE METABOLIC PANEL
AG Ratio: 1.5 (calc) (ref 1.0–2.5)
ALT: 14 U/L (ref 9–46)
AST: 11 U/L (ref 10–35)
Albumin: 4 g/dL (ref 3.6–5.1)
Alkaline phosphatase (APISO): 57 U/L (ref 35–144)
BUN: 20 mg/dL (ref 7–25)
CO2: 22 mmol/L (ref 20–32)
Calcium: 9 mg/dL (ref 8.6–10.3)
Chloride: 106 mmol/L (ref 98–110)
Creat: 1.13 mg/dL (ref 0.70–1.28)
Globulin: 2.6 g/dL (calc) (ref 1.9–3.7)
Glucose, Bld: 137 mg/dL — ABNORMAL HIGH (ref 65–99)
Potassium: 4.2 mmol/L (ref 3.5–5.3)
Sodium: 141 mmol/L (ref 135–146)
Total Bilirubin: 0.9 mg/dL (ref 0.2–1.2)
Total Protein: 6.6 g/dL (ref 6.1–8.1)

## 2021-03-21 LAB — HEMOGLOBIN A1C
Hgb A1c MFr Bld: 6.3 % of total Hgb — ABNORMAL HIGH (ref <5.7)
Mean Plasma Glucose: 134 mg/dL
eAG (mmol/L): 7.4 mmol/L

## 2021-03-21 LAB — LIPID PANEL
Cholesterol: 117 mg/dL (ref <200)
HDL: 40 mg/dL (ref >=40)
LDL Cholesterol (Calc): 55 mg/dL (calc)
Non-HDL Cholesterol (Calc): 77 mg/dL (calc) (ref <130)
Total CHOL/HDL Ratio: 2.9 (calc) (ref <5.0)
Triglycerides: 140 mg/dL (ref <150)

## 2021-04-10 DIAGNOSIS — M5431 Sciatica, right side: Secondary | ICD-10-CM | POA: Diagnosis not present

## 2021-04-10 DIAGNOSIS — M9903 Segmental and somatic dysfunction of lumbar region: Secondary | ICD-10-CM | POA: Diagnosis not present

## 2021-04-11 DIAGNOSIS — M5431 Sciatica, right side: Secondary | ICD-10-CM | POA: Diagnosis not present

## 2021-04-11 DIAGNOSIS — M9903 Segmental and somatic dysfunction of lumbar region: Secondary | ICD-10-CM | POA: Diagnosis not present

## 2021-04-17 ENCOUNTER — Telehealth: Payer: Self-pay

## 2021-04-17 NOTE — Telephone Encounter (Signed)
Per wife: Pt would like to do AWV every other year. Declines to do this year. ?

## 2021-04-22 DIAGNOSIS — M5431 Sciatica, right side: Secondary | ICD-10-CM | POA: Diagnosis not present

## 2021-04-22 DIAGNOSIS — M9903 Segmental and somatic dysfunction of lumbar region: Secondary | ICD-10-CM | POA: Diagnosis not present

## 2021-04-24 DIAGNOSIS — M5431 Sciatica, right side: Secondary | ICD-10-CM | POA: Diagnosis not present

## 2021-04-24 DIAGNOSIS — M9903 Segmental and somatic dysfunction of lumbar region: Secondary | ICD-10-CM | POA: Diagnosis not present

## 2021-05-17 ENCOUNTER — Other Ambulatory Visit: Payer: Self-pay | Admitting: Family Medicine

## 2021-05-22 ENCOUNTER — Telehealth: Payer: Self-pay | Admitting: Family Medicine

## 2021-05-22 NOTE — Telephone Encounter (Signed)
Wife called for her husband. ? ?He's needing med refill for medication ?They told her to call the office they won't approve medication ? ?lisinopril ?lisinopril (ZESTRIL) 10 MG tablet ? ?Pt cell: [phone] ? ?[facility] [place] [place] [facility] [address] Phone:  [phone]  ?Fax:  [phone]  ?  ? ?

## 2021-05-22 NOTE — Telephone Encounter (Signed)
Last rx completed 02/13/21(90,1) to [facility]. Pt advised. He has pills left but pharmacy told him we would not refill medication. He voiced understanding.  ?

## 2021-05-27 ENCOUNTER — Other Ambulatory Visit: Payer: Self-pay | Admitting: Family Medicine

## 2021-06-26 ENCOUNTER — Ambulatory Visit (INDEPENDENT_AMBULATORY_CARE_PROVIDER_SITE_OTHER): Payer: Medicare HMO | Admitting: Family Medicine

## 2021-06-26 ENCOUNTER — Telehealth: Payer: Self-pay

## 2021-06-26 ENCOUNTER — Encounter: Payer: Self-pay | Admitting: Family Medicine

## 2021-06-26 VITALS — BP 132/76 | HR 80 | Temp 98.3°F | Ht 73.5 in | Wt 250.0 lb

## 2021-06-26 DIAGNOSIS — Z125 Encounter for screening for malignant neoplasm of prostate: Secondary | ICD-10-CM

## 2021-06-26 DIAGNOSIS — Z1211 Encounter for screening for malignant neoplasm of colon: Secondary | ICD-10-CM | POA: Diagnosis not present

## 2021-06-26 DIAGNOSIS — Z0000 Encounter for general adult medical examination without abnormal findings: Secondary | ICD-10-CM

## 2021-06-26 DIAGNOSIS — E1121 Type 2 diabetes mellitus with diabetic nephropathy: Secondary | ICD-10-CM | POA: Diagnosis not present

## 2021-06-26 DIAGNOSIS — I10 Essential (primary) hypertension: Secondary | ICD-10-CM | POA: Diagnosis not present

## 2021-06-26 DIAGNOSIS — E7800 Pure hypercholesterolemia, unspecified: Secondary | ICD-10-CM | POA: Diagnosis not present

## 2021-06-26 DIAGNOSIS — D509 Iron deficiency anemia, unspecified: Secondary | ICD-10-CM

## 2021-06-26 LAB — CBC
HCT: 34.8 % — ABNORMAL LOW (ref 39.0–52.0)
Hemoglobin: 10.8 g/dL — ABNORMAL LOW (ref 13.0–17.0)
MCHC: 31.1 g/dL (ref 30.0–36.0)
MCV: 62.2 fl — ABNORMAL LOW (ref 78.0–100.0)
Platelets: 241 10*3/uL (ref 150.0–400.0)
RBC: 5.59 Mil/uL (ref 4.22–5.81)
RDW: 16.4 % — ABNORMAL HIGH (ref 11.5–15.5)
WBC: 5.9 10*3/uL (ref 4.0–10.5)

## 2021-06-26 LAB — MICROALBUMIN / CREATININE URINE RATIO
Creatinine,U: 92.8 mg/dL
Microalb Creat Ratio: 3 mg/g (ref 0.0–30.0)
Microalb, Ur: 2.7 mg/dL — ABNORMAL HIGH (ref 0.0–1.9)

## 2021-06-26 LAB — PSA, MEDICARE: PSA: 0.69 ng/ml (ref 0.10–4.00)

## 2021-06-26 LAB — BASIC METABOLIC PANEL
BUN: 27 mg/dL — ABNORMAL HIGH (ref 6–23)
CO2: 24 mEq/L (ref 19–32)
Calcium: 9.3 mg/dL (ref 8.4–10.5)
Chloride: 105 mEq/L (ref 96–112)
Creatinine, Ser: 1.28 mg/dL (ref 0.40–1.50)
GFR: 55.67 mL/min — ABNORMAL LOW (ref >60.00)
Glucose, Bld: 143 mg/dL — ABNORMAL HIGH (ref 70–99)
Potassium: 4.1 mEq/L (ref 3.5–5.1)
Sodium: 140 mEq/L (ref 135–145)

## 2021-06-26 LAB — HEMOGLOBIN A1C: Hgb A1c MFr Bld: 6.7 % — ABNORMAL HIGH (ref 4.6–6.5)

## 2021-06-26 MED ORDER — HYDROCORTISONE VALERATE 0.2 % EX OINT: TOPICAL_OINTMENT | CUTANEOUS | 2 refills | Status: DC

## 2021-06-26 MED ORDER — METFORMIN HCL 1000 MG PO TABS: 1000.0000 mg | ORAL_TABLET | Freq: Two times a day (BID) | ORAL | 1 refills | Status: DC

## 2021-06-26 MED ORDER — ATORVASTATIN CALCIUM 20 MG PO TABS: 20.0000 mg | ORAL_TABLET | Freq: Every day | ORAL | 1 refills | Status: DC

## 2021-06-26 MED ORDER — LISINOPRIL 10 MG PO TABS: 10.0000 mg | ORAL_TABLET | Freq: Every day | ORAL | 1 refills | Status: DC

## 2021-06-26 MED ORDER — PIOGLITAZONE HCL 45 MG PO TABS: 45.0000 mg | ORAL_TABLET | Freq: Every day | ORAL | 1 refills | Status: DC

## 2021-06-26 MED ORDER — CHOLESTYRAMINE 4 G PO PACK: 4.0000 g | PACK | ORAL | 3 refills | Status: DC

## 2021-06-26 NOTE — Patient Instructions (Signed)
Health Maintenance, Male Adopting a healthy lifestyle and getting preventive care are important in promoting health and wellness. Ask your health care provider about: The right schedule for you to have regular tests and exams. Things you can do on your own to prevent diseases and keep yourself healthy. What should I know about diet, weight, and exercise? Eat a healthy diet  Eat a diet that includes plenty of vegetables, fruits, low-fat dairy products, and lean protein. Do not eat a lot of foods that are high in solid fats, added sugars, or sodium. Maintain a healthy weight Body mass index (BMI) is a measurement that can be used to identify possible weight problems. It estimates body fat based on height and weight. Your health care provider can help determine your BMI and help you achieve or maintain a healthy weight. Get regular exercise Get regular exercise. This is one of the most important things you can do for your health. Most adults should: Exercise for at least 150 minutes each week. The exercise should increase your heart rate and make you sweat (moderate-intensity exercise). Do strengthening exercises at least twice a week. This is in addition to the moderate-intensity exercise. Spend less time sitting. Even light physical activity can be beneficial. Watch cholesterol and blood lipids Have your blood tested for lipids and cholesterol at 73 years of age, then have this test every 5 years. You may need to have your cholesterol levels checked more often if: Your lipid or cholesterol levels are high. You are older than 73 years of age. You are at high risk for heart disease. What should I know about cancer screening? Many types of cancers can be detected early and may often be prevented. Depending on your health history and family history, you may need to have cancer screening at various ages. This may include screening for: Colorectal cancer. Prostate cancer. Skin cancer. Lung  cancer. What should I know about heart disease, diabetes, and high blood pressure? Blood pressure and heart disease High blood pressure causes heart disease and increases the risk of stroke. This is more likely to develop in people who have high blood pressure readings or are overweight. Talk with your health care provider about your target blood pressure readings. Have your blood pressure checked: Every 3-5 years if you are 18-39 years of age. Every year if you are 40 years old or older. If you are between the ages of 65 and 75 and are a current or former smoker, ask your health care provider if you should have a one-time screening for abdominal aortic aneurysm (AAA). Diabetes Have regular diabetes screenings. This checks your fasting blood sugar level. Have the screening done: Once every three years after age 45 if you are at a normal weight and have a low risk for diabetes. More often and at a younger age if you are overweight or have a high risk for diabetes. What should I know about preventing infection? Hepatitis B If you have a higher risk for hepatitis B, you should be screened for this virus. Talk with your health care provider to find out if you are at risk for hepatitis B infection. Hepatitis C Blood testing is recommended for: Everyone born from 1945 through 1965. Anyone with known risk factors for hepatitis C. Sexually transmitted infections (STIs) You should be screened each year for STIs, including gonorrhea and chlamydia, if: You are sexually active and are younger than 73 years of age. You are older than 73 years of age and your   health care provider tells you that you are at risk for this type of infection. Your sexual activity has changed since you were last screened, and you are at increased risk for chlamydia or gonorrhea. Ask your health care provider if you are at risk. Ask your health care provider about whether you are at high risk for HIV. Your health care provider  may recommend a prescription medicine to help prevent HIV infection. If you choose to take medicine to prevent HIV, you should first get tested for HIV. You should then be tested every 3 months for as long as you are taking the medicine. Follow these instructions at home: Alcohol use Do not drink alcohol if your health care provider tells you not to drink. If you drink alcohol: Limit how much you have to 0-2 drinks a day. Know how much alcohol is in your drink. In the [place] one drink equals one 12 oz bottle of beer (355 mL), one 5 oz glass of wine (148 mL), or one 1 oz glass of hard liquor (44 mL). Lifestyle Do not use any products that contain nicotine or tobacco. These products include cigarettes, chewing tobacco, and vaping devices, such as e-cigarettes. If you need help quitting, ask your health care provider. Do not use street drugs. Do not share needles. Ask your health care provider for help if you need support or information about quitting drugs. General instructions Schedule regular health, dental, and eye exams. Stay current with your vaccines. Tell your health care provider if: You often feel depressed. You have ever been abused or do not feel safe at home. Summary Adopting a healthy lifestyle and getting preventive care are important in promoting health and wellness. Follow your health care provider's instructions about healthy diet, exercising, and getting tested or screened for diseases. Follow your health care provider's instructions on monitoring your cholesterol and blood pressure. This information is not intended to replace advice given to you by your health care provider. Make sure you discuss any questions you have with your health care provider. Document Revised: 06/18/2020 Document Reviewed: 06/18/2020 Elsevier Patient Education  2023 Elsevier Inc.  

## 2021-06-26 NOTE — Telephone Encounter (Signed)
Spoke with pt to schedule AWV in office. Patient declined to schedule wellness visit at this time.   

## 2021-06-26 NOTE — Progress Notes (Signed)
Office Note ?06/26/2021 ? ?CC:  ?Chief Complaint  ?Patient presents with  ? Annual Exam  ?  Pt is fasting  ? ?HPI:  ?Patient is a 71 y.o. male who is here for annual health maintenance exam and 3 mo f/u DM, HTN, HLD, CRI III. ?A/P as of last visit: ?"1) DM 2 - well controlled on metformin and pioglitazone. Last a1c was 6.5%. ?Recheck a1c today. ?  ?2) HTN - well controlled on lisinopril '10mg'$  qd, hctz 25 qd, and amlod 10 qd. ?Electrolytes and creatinine today. ?  ?3) CRI III: he avoids regular use of nsaids, is working on better hydration habits. ?Electrolytes and creatinine today. ?  ?4) chronic sinusitis.  Patient reports a long history of this, not much response to daily antihistamine or various nasal sprays in the past. ?He saw an ENT at least 2 decades ago, is interested in seeing 1 again to see if anything new to offer.  Referral made today." ? ?INTERIM HX: ?Feeling well, no concerns. ?No home bp or glucose monitoring data today. ? ? ?Past Medical History:  ?Diagnosis Date  ? Carpal tunnel syndrome on left   ? Cataract 07/2015  ? R eye--surgery recommended by his eye MD 08/03/15  ? Chronic fatigue   ? Chronic renal insufficiency, stage 3 (moderate) ([place]) 2021  ? Cr 1.2-1.4, GFR 50s-60s  ? Closed fracture of greater tuberosity of right humerus 02/28/2019  ? Immobil w/sling (Dr. Mardelle Matte)  ? Colon cancer screening 02/15/2018  ? iFOB neg 02/2018 and 06/2020.  ? Erectile dysfunction   ? Dr. Gaynelle Arabian.  Testosterone normal 04/2016 at [facility].  ? Gout of big toe 1990s  ? only one episode; he cut back on peanut butter and hasn't had any prob since  ? Herpes simplex ophthalmicus   ? L eye, has opthalmologist, uses prednisone drops prn  ? History of gallstones   ? per pt report  ? History of kidney stones   ? History of prostatitis 1980s  ? Remote hx of seeing Dr. Terance Hart at [facility]--then Dr. Gaynelle Arabian.   ? Hyperlipidemia   ? Hypertension dx'd 08/2013  ? Microcytic anemia 06/2016  ? Nodular basal cell carcinoma  (BCC) 02/19/2017  ? right temple-[id number]. Dr. Denna Haggard  ? Shingles   ? recurrent L thorax  ? Sinus disease   ? Thalassemia   ? Mild anemia with MCV 60s (alpha thal minor or beta thal trait: Hb electrophoresis not done/no result available.).  ? Type 2 diabetes mellitus with microalbuminuria ([place]) Dx'd 08/2013  ? ? ?Past Surgical History:  ?Procedure Laterality Date  ? CHOLECYSTECTOMY N/A 01/26/2020  ? Procedure: LAPAROSCOPIC CHOLECYSTECTOMY;  Surgeon: Coralie Keens, MD;  Location: [facility];  Service: General;  Laterality: N/A;  ? [place]  ? WISDOM TOOTH EXTRACTION    ? ? ?Family History  ?Problem Relation Age of Onset  ? Heart disease Mother   ? Hypertension Mother   ? Diabetes Mother   ? Heart disease Father   ? Heart attack Sister   ? ? ?Social History  ? ?Socioeconomic History  ? Marital status: Married  ?  Spouse name: Not on file  ? Number of children: Not on file  ? Years of education: Not on file  ? Highest education level: Not on file  ?Occupational History  ? Not on file  ?Tobacco Use  ? Smoking status: Former  ?  Types: Cigarettes  ?  Quit date: 02/28/1970  ?  Years since quitting:  51.3  ? Smokeless tobacco: Never  ?Vaping Use  ? Vaping Use: Never used  ?Substance and Sexual Activity  ? Alcohol use: No  ?  Alcohol/week: 0.0 standard drinks  ? Drug use: No  ? Sexual activity: Not on file  ?Other Topics Concern  ? Not on file  ?Social History Narrative  ? Married, has one 58 y/o son.  ? 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    ? Orig from [place].  ? Was in [employer] [phone].  No combat duty.  No VA care.  ? Smoked 10-12 yrs, quit in his early 48s.  ? Alcohol: none  ? No hx of drugs.  ?   ?   ?   ? ?Social Determinants of Health  ? ?Financial Resource Strain: Low Risk   ? Difficulty of Paying Living Expenses: Not hard at all  ?Food Insecurity: No Food Insecurity  ? Worried About Charity fundraiser in the Last Year: Never true  ? Ran Out of Food in the Last Year: Never true   ?Transportation Needs: No Transportation Needs  ? Lack of Transportation (Medical): No  ? Lack of Transportation (Non-Medical): No  ?Physical Activity: Insufficiently Active  ? Days of Exercise per Week: 3 days  ? Minutes of Exercise per Session: 30 min  ?Stress: No Stress Concern Present  ? Feeling of Stress : Not at all  ?Social Connections: Moderately Integrated  ? Frequency of Communication with Friends and Family: Three times a week  ? Frequency of Social Gatherings with Friends and Family: Once a week  ? Attends Religious Services: More than 4 times per year  ? Active Member of Clubs or Organizations: No  ? Attends Archivist Meetings: Never  ? Marital Status: Married  ?Intimate Partner Violence: Not At Risk  ? Fear of Current or Ex-Partner: No  ? Emotionally Abused: No  ? Physically Abused: No  ? Sexually Abused: No  ? ? ?Outpatient Medications Prior to Visit  ?Medication Sig Dispense Refill  ? acetaminophen (TYLENOL) 500 MG tablet Take 500-1,000 mg by mouth every 6 (six) hours as needed (pain.).    ? amLODipine (NORVASC) 10 MG tablet TAKE 1 TABLET DAILY 90 tablet 1  ? atorvastatin (LIPITOR) 20 MG tablet Take 1 tablet (20 mg total) by mouth daily. 90 tablet 3  ? cetirizine (ZYRTEC) 10 MG tablet Take 10 mg by mouth daily.    ? cholestyramine (QUESTRAN) 4 g packet Take 4 g by mouth once a week.    ? glucose blood test strip Use as instructed 100 each 6  ? hydrochlorothiazide (HYDRODIURIL) 25 MG tablet TAKE 1 TABLET DAILY 90 tablet 1  ? hydrocortisone valerate ointment (WEST-CORT) 0.2 % Appy bid to affected area prn 15 g 2  ? Lancets (FREESTYLE) lancets Use as instructed 100 each 12  ? lisinopril (ZESTRIL) 10 MG tablet TAKE 1 TABLET DAILY 90 tablet 1  ? metFORMIN (GLUCOPHAGE) 1000 MG tablet TAKE 1 TABLET TWICE DAILY  WITH MEALS 180 tablet 1  ? phenylephrine (4-WAY FAST ACTING) 1 % nasal spray Place 1 drop into both nostrils every 6 (six) hours as needed for congestion.    ? pioglitazone (ACTOS) 45 MG  tablet Take 1 tablet (45 mg total) by mouth daily. 90 tablet 3  ? prednisoLONE acetate (PRED FORTE) 1 % ophthalmic suspension Place 1 drop into both eyes daily as needed (eye irritation).    ? ?No facility-administered medications prior to visit.  ? ? ?  No Known Allergies ? ?ROS ?Review of Systems  ?Constitutional:  Negative for appetite change, chills, fatigue and fever.  ?HENT:  Negative for congestion, dental problem, ear pain and sore throat.   ?Eyes:  Negative for discharge, redness and visual disturbance.  ?Respiratory:  Negative for cough, chest tightness, shortness of breath and wheezing.   ?Cardiovascular:  Negative for chest pain, palpitations and leg swelling.  ?Gastrointestinal:  Negative for abdominal pain, blood in stool, diarrhea, nausea and vomiting.  ?Genitourinary:  Negative for difficulty urinating, dysuria, flank pain, frequency, hematuria and urgency.  ?Musculoskeletal:  Negative for arthralgias, back pain, joint swelling, myalgias and neck stiffness.  ?Skin:  Negative for pallor and rash.  ?Neurological:  Negative for dizziness, speech difficulty, weakness and headaches.  ?Hematological:  Negative for adenopathy. Does not bruise/bleed easily.  ?Psychiatric/Behavioral:  Negative for confusion and sleep disturbance. The patient is not nervous/anxious.   ? ?PE; ? ?  06/26/2021  ?  8:40 AM 03/20/2021  ?  9:31 AM 12/06/2020  ?  8:57 AM  ?Vitals with BMI  ?Height 6' 1.5" '6\' 1"'$  '6\' 1"'$   ?Weight 250 lbs 254 lbs 6 oz 250 lbs 10 oz  ?BMI 32.53 33.57 33.07  ?Systolic [phone]  ?Diastolic 76 75 73  ?Pulse 80 70 71  ? ? ? ?Gen: Alert, well appearing.  Patient is oriented to person, place, time, and situation. ?AFFECT: pleasant, lucid thought and speech. ?ENT: Ears: EACs clear, normal epithelium.  TMs with good light reflex and landmarks bilaterally.  Eyes: no injection, icteris, swelling, or exudate.  EOMI, PERRLA. ?Nose: no drainage or turbinate edema/swelling.  No injection or focal lesion.  Mouth: lips  without lesion/swelling.  Oral mucosa pink and moist.  Dentition intact and without obvious caries or gingival swelling.  Oropharynx without erythema, exudate, or swelling.  ?Neck: supple/nontender.  No LAD

## 2021-06-27 ENCOUNTER — Telehealth: Payer: Self-pay

## 2021-06-27 ENCOUNTER — Other Ambulatory Visit: Payer: Self-pay

## 2021-06-27 MED ORDER — HYDROCORTISONE VALERATE 0.2 % EX OINT: TOPICAL_OINTMENT | CUTANEOUS | 2 refills | Status: DC

## 2021-06-27 MED ORDER — CHOLESTYRAMINE 4 G PO PACK: 4.0000 g | PACK | ORAL | 3 refills | Status: DC

## 2021-06-27 NOTE — Telephone Encounter (Signed)
Patient was seen yesterday. Meds needed to be sent to local pharmacy, not mail order. I directed wife to call mail order to have prescriptions cancelled in their system.   Wife called back, they have cancelled order.  [facility]  hydrocortisone valerate ointment (WEST-CORT) 0.2 % [[id number]]   cholestyramine (QUESTRAN) 4 g packet [[id number]]

## 2021-06-27 NOTE — Telephone Encounter (Signed)
Pt was contacted regarding results and recalled needing rx sent to local pharmacy. This has been handled. Pt was made aware

## 2021-08-08 DIAGNOSIS — J342 Deviated nasal septum: Secondary | ICD-10-CM | POA: Diagnosis not present

## 2021-08-08 DIAGNOSIS — J3489 Other specified disorders of nose and nasal sinuses: Secondary | ICD-10-CM | POA: Diagnosis not present

## 2021-08-11 ENCOUNTER — Other Ambulatory Visit: Payer: Self-pay | Admitting: Family Medicine

## 2021-08-16 DIAGNOSIS — J3489 Other specified disorders of nose and nasal sinuses: Secondary | ICD-10-CM | POA: Diagnosis not present

## 2021-08-16 DIAGNOSIS — J342 Deviated nasal septum: Secondary | ICD-10-CM | POA: Diagnosis not present

## 2021-08-28 ENCOUNTER — Ambulatory Visit (INDEPENDENT_AMBULATORY_CARE_PROVIDER_SITE_OTHER): Payer: Medicare HMO

## 2021-08-28 DIAGNOSIS — Z0000 Encounter for general adult medical examination without abnormal findings: Secondary | ICD-10-CM

## 2021-08-28 NOTE — Progress Notes (Signed)
Virtual Visit via Telephone Note  I connected with  Zachary Chandler on 08/28/21 at 11:30 AM EDT by telephone and verified that I am speaking with the correct person using two identifiers.  Medicare Annual Wellness visit completed telephonically due to Covid-19 pandemic.   Persons participating in this call: This Health Coach and this patient.   Location: Patient: home Provider: office    I discussed the limitations, risks, security and privacy concerns of performing an evaluation and management service by telephone and the availability of in person appointments. The patient expressed understanding and agreed to proceed.  Unable to perform video visit due to video visit attempted and failed and/or patient does not have video capability.   Some vital signs may be absent or patient reported.   Zachary Brace, LPN   Subjective:   Zachary Chandler is a 71 y.o. male who presents for Medicare Annual/Subsequent preventive examination.  Review of Systems     Cardiac Risk Factors include: advanced age (>55mn, >>22women);diabetes mellitus;hypertension;dyslipidemia;male gender;obesity (BMI >30kg/m2)     Objective:    There were no vitals filed for this visit. There is no height or weight on file to calculate BMI.     08/28/2021   11:30 AM 08/22/2020    2:21 PM 01/23/2020   12:30 PM 11/15/2018   12:00 PM 07/08/2016    8:38 AM  Advanced Directives  Does Patient Have a Medical Advance Directive? Yes Yes No No No  Type of Advance Directive Healthcare [place]    Does patient want to make changes to medical advance directive?  No - Patient declined     [place]? No - copy requested      Would patient like information on creating a medical advance directive?  No - Patient declined   No - Patient declined    Current Medications (verified) Outpatient Encounter Medications as of 08/28/2021  Medication Sig    acetaminophen (TYLENOL) 500 MG tablet Take 500-1,000 mg by mouth every 6 (six) hours as needed (pain.).   amLODipine (NORVASC) 10 MG tablet TAKE 1 TABLET DAILY   atorvastatin (LIPITOR) 20 MG tablet Take 1 tablet (20 mg total) by mouth daily.   cetirizine (ZYRTEC) 10 MG tablet Take 10 mg by mouth daily.   cholestyramine (QUESTRAN) 4 g packet Take 1 packet (4 g total) by mouth once a week.   glucose blood test strip Use as instructed   hydrochlorothiazide (HYDRODIURIL) 25 MG tablet TAKE 1 TABLET DAILY   hydrocortisone valerate ointment (WEST-CORT) 0.2 % Appy bid to affected area prn   Lancets (FREESTYLE) lancets Use as instructed   lisinopril (ZESTRIL) 10 MG tablet Take 1 tablet (10 mg total) by mouth daily.   metFORMIN (GLUCOPHAGE) 1000 MG tablet Take 1 tablet (1,000 mg total) by mouth 2 (two) times daily with a meal.   phenylephrine (4-WAY FAST ACTING) 1 % nasal spray Place 1 drop into both nostrils every 6 (six) hours as needed for congestion.   pioglitazone (ACTOS) 45 MG tablet Take 1 tablet (45 mg total) by mouth daily.   prednisoLONE acetate (PRED FORTE) 1 % ophthalmic suspension Place 1 drop into both eyes daily as needed (eye irritation).   No facility-administered encounter medications on file as of 08/28/2021.    Allergies (verified) Patient has no known allergies.   History: Past Medical History:  Diagnosis Date   Carpal tunnel syndrome on left  Cataract 07/2015   R eye--surgery recommended by his eye MD 08/03/15   Chronic fatigue    Chronic renal insufficiency, stage 3 (moderate) ([place]) 2021   Cr 1.2-1.4, GFR 50s-60s   Closed fracture of greater tuberosity of right humerus 02/28/2019   Immobil w/sling (Dr. Mardelle Matte)   Colon cancer screening 02/15/2018   iFOB neg 02/2018 and 06/2020.   Erectile dysfunction    Dr. Gaynelle Arabian.  [facility]/2016 [facility].   Gout of big toe 1990s   only one episode; he cut back on peanut butter and hasn't had any prob since   Herpes  simplex ophthalmicus    L eye, has opthalmologist, uses prednisone drops prn   History of gallstones    per pt report   History of kidney stones    History of prostatitis 1980s   Remote hx of seeing Dr. Terance Hart at [facility]--then Dr. Gaynelle Arabian.    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 Dr. Denna Haggard   Shingles    recurrent L thorax   Sinus disease    Thalassemia    Mild anemia with MCV 60s (alpha thal minor or beta thal trait: Hb electrophoresis not done/no result available.).   Type 2 diabetes mellitus with microalbuminuria ([place]) Dx'd 08/2013   Past Surgical History:  Procedure Laterality Date   CHOLECYSTECTOMY N/A 01/26/2020   Procedure: LAPAROSCOPIC CHOLECYSTECTOMY;  Surgeon: Coralie Keens, MD;  Location: [facility];  Service: General;  Laterality: N/A;   NASAL POLYP EXCISION  1960   WISDOM TOOTH EXTRACTION     Family History  Problem Relation Age of Onset   Heart disease Mother    Hypertension Mother    Diabetes Mother    Heart disease Father    Heart attack Sister    Social History   Socioeconomic History   Marital status: Married    Spouse name: Not on file   Number of children: Not on file   Years of education: Not on file   Highest education level: Not on file  Occupational History   Not on file  Tobacco Use   Smoking status: Former    Types: Cigarettes    Quit date: 02/28/1970    Years since quitting: 51.5   Smokeless tobacco: Never  Vaping Use   Vaping Use: Never used  Substance and Sexual Activity   Alcohol use: No    Alcohol/week: 0.0 standard drinks of alcohol   Drug use: No   Sexual activity: Not on file  Other Topics Concern   Not on file  Social History Narrative   Married, has one 33 y/o son.   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     Orig from [place].   Was in [employer] [id number].  No combat duty.  No VA care.    Smoked 10-12 yrs, quit in his early 25s.   Alcohol: none   No hx of drugs.            Social Determinants of Health   Financial Resource Strain: Low Risk  (08/28/2021)   Overall Financial Resource Strain (CARDIA)    Difficulty of Paying Living Expenses: Not hard at all  Food Insecurity: No Food Insecurity (08/28/2021)   Hunger Vital Sign    Worried About Running Out of Food in the Last Year: Never true    Ran Out of Food in the Last Year: Never  true  Transportation Needs: No Transportation Needs (08/28/2021)   PRAPARE - Hydrologist (Medical): No    Lack of Transportation (Non-Medical): No  Physical Activity: Inactive (08/28/2021)   Exercise Vital Sign    Days of Exercise per Week: 0 days    Minutes of Exercise per Session: 0 min  Stress: No Stress Concern Present (08/28/2021)   [place]    Feeling of Stress : Not at all  Social Connections: Moderately Integrated (08/28/2021)   Social Connection and Isolation Panel [NHANES]    Frequency of Communication with Friends and Family: Once a week    Frequency of Social Gatherings with Friends and Family: Twice a week    Attends Religious Services: More than 4 times per year    Active Member of [employer] or Organizations: No    Attends Music therapist: Never    Marital Status: Married    Tobacco Counseling Counseling given: Not Answered   Clinical Intake:  Pre-visit preparation completed: Yes  Pain : No/denies pain     BMI - recorded: 32.54 Nutritional Status: BMI > 30  Obese Nutritional Risks: None Diabetes: Yes CBG done?: No Did pt. bring in CBG monitor from home?: No  How often do you need to have someone help you when you read instructions, pamphlets, or other written materials from your doctor or pharmacy?: 1 - Never  Diabetic?Nutrition Risk Assessment:  Has the patient had any N/V/D within the last 2 months?   No  Does the patient have any non-healing wounds?  No  Has the patient had any unintentional weight loss or weight gain?  No   Diabetes:  Is the patient diabetic?  Yes  If diabetic, was a CBG obtained today?  No  Did the patient bring in their glucometer from home?  No  How often do you monitor your CBG's? N/a.   Financial Strains and Diabetes Management:  Are you having any financial strains with the device, your supplies or your medication? No .  Does the patient want to be seen by Chronic Care Management for management of their diabetes?  No  Would the patient like to be referred to a Nutritionist or for Diabetic Management?  No   Diabetic Exams:  Diabetic Eye Exam: Completed 10/18/20 Diabetic Foot Exam: Completed 06/26/21   Interpreter Needed?: No  Information entered by :: Charlott Rakes, LPN   Activities of Daily Living    08/28/2021   11:31 AM  In your present state of health, do you have any difficulty performing the following activities:  Hearing? 0  Vision? 0  Difficulty concentrating or making decisions? 0  Walking or climbing stairs? 0  Dressing or bathing? 0  Doing errands, shopping? 0  Preparing Food and eating ? N  Using the Toilet? N  In the past six months, have you accidently leaked urine? N  Do you have problems with loss of bowel control? Y  Comment at times  Managing your Medications? N  Managing your Finances? N  Housekeeping or managing your Housekeeping? N    Patient Care Team: Tammi Sou, MD as PCP - General (Family Medicine) Danice Goltz, MD as Consulting Physician (Ophthalmology) Carolan Clines, MD (Inactive) as Consulting Physician (Urology) Marchia Bond, MD as Consulting Physician (Orthopedic Surgery) Coralie Keens, MD as Consulting Physician (General Surgery) [name], MD as Consulting Physician (Dermatology)  Indicate any recent Medical Services you may have received  from other than [facility] providers  in the past year (date may be approximate).     Assessment:   This is a routine wellness examination for [place].  Hearing/Vision screen Hearing Screening - Comments:: Pt denies any hearing issues  Vision Screening - Comments:: Pt follows up with Dr  Wynetta Emery for annual eye exams   Dietary issues and exercise activities discussed: Current Exercise Habits: The patient does not participate in regular exercise at present   Goals Addressed             This Visit's Progress    Patient Stated       None at this time        Depression Screen    08/28/2021   11:29 AM 06/26/2021    8:47 AM 12/06/2020    9:04 AM 09/06/2020    8:29 AM 08/22/2020    2:32 PM 06/06/2020    7:57 AM 08/31/2019    8:04 AM  PHQ 2/9 Scores  PHQ - 2 Score 0 0 0 0 0 0 0    Fall Risk    08/28/2021   11:31 AM 06/26/2021    8:48 AM 12/06/2020    9:04 AM 09/06/2020    8:29 AM 08/22/2020    2:25 PM  Fall Risk   Falls in the past year? 0 0 0 0 0  Number falls in past yr: 0 0 0  0  Injury with Fall? 0 0 0  0  Risk for fall due to : Impaired balance/gait;Impaired vision Impaired vision     Follow up Falls prevention discussed Falls evaluation completed Falls evaluation completed  Falls evaluation completed;Falls prevention discussed    FALL RISK PREVENTION PERTAINING TO THE HOME:  Any stairs in or around the home? Yes  If so, are there any without handrails? No Home free of loose throw rugs in walkways, pet beds, electrical cords, etc? Yes  Adequate lighting in your home to reduce risk of falls? Yes   ASSISTIVE DEVICES UTILIZED TO PREVENT FALLS:  Life alert? No  Use of a cane, walker or w/c? No  Grab bars in the bathroom? No  Shower chair or bench in shower? No  Elevated toilet seat or a handicapped toilet? No   TIMED UP AND GO:  Was the test performed? No .    Cognitive Function:        08/28/2021   11:33 AM  6CIT Screen  What Year? 0 points  What month? 0 points  What time? 0 points  Count  back from 20 0 points  Months in reverse 0 points  Repeat phrase 0 points  Total Score 0 points    Immunizations Immunization History  Administered Date(s) Administered   PFIZER Comirnaty(Gray Top)Covid-19 Tri-Sucrose Vaccine 08/06/2020   PFIZER(Purple Top)SARS-COV-2 Vaccination 11/01/2019, 11/22/2019   Pneumococcal Conjugate-13 09/20/2013   Pneumococcal Polysaccharide-23 01/08/2015   Tdap 01/07/2016    TDAP status: Up to date  Flu Vaccine status: Declined, Education has been provided regarding the importance of this vaccine but patient still declined. Advised may receive this vaccine at local pharmacy or Health Dept. Aware to provide a copy of the vaccination record if obtained from local pharmacy or Health Dept. Verbalized acceptance and understanding.  Pneumococcal vaccine status: Up to date  Covid-19 vaccine status: Completed vaccines  Qualifies for Shingles Vaccine? Yes   Zostavax completed No   Shingrix Completed?: No.    Education has been provided regarding the importance of this vaccine. Patient has been  advised to call insurance company to determine out of pocket expense if they have not yet received this vaccine. Advised may also receive vaccine at local pharmacy or Health Dept. Verbalized acceptance and understanding.  Screening Tests Health Maintenance  Topic Date Due   COLONOSCOPY (Pts 45-18yr Insurance coverage will need to be confirmed)  07/08/2017   COVID-19 Vaccine (4 - Booster for Pfizer series) 10/01/2020   Zoster Vaccines- Shingrix (1 of 2) 09/26/2021 (Originally 05/14/1969)   INFLUENZA VACCINE  09/10/2021   OPHTHALMOLOGY EXAM  10/18/2021   HEMOGLOBIN A1C  12/27/2021   FOOT EXAM  06/27/2022   TETANUS/TDAP  01/06/2026   Pneumonia Vaccine 71 Years old  Completed   Hepatitis C Screening  Completed   HPV VACCINES  Aged Out    Health Maintenance  Health Maintenance Due  Topic Date Due   COLONOSCOPY (Pts 45-489yrInsurance coverage will need to be  confirmed)  07/08/2017   COVID-19 Vaccine (4 - Booster for PfBetsy Layneeries) 10/01/2020    Colorectal cancer screening: Type of screening: FOBT/FIT. Completed 06/14/20. Repeat every 1 years   Additional Screening:  Hepatitis C Screening:  Completed 01/07/16  Vision Screening: Recommended annual ophthalmology exams for early detection of glaucoma and other disorders of the eye. Is the patient up to date with their annual eye exam?  Yes  Who is the provider or what is the name of the office in which the patient attends annual eye exams? Dr JoWynetta EmeryIf pt is not established with a provider, would they like to be referred to a provider to establish care? No .   Dental Screening: Recommended annual dental exams for proper oral hygiene  Community Resource Referral / Chronic Care Management: CRR required this visit?  No   CCM required this visit?  No      Plan:     I have personally reviewed and noted the following in the patient's chart:   Medical and social history Use of alcohol, tobacco or illicit drugs  Current medications and supplements including opioid prescriptions. Patient is not currently taking opioid prescriptions. Functional ability and status Nutritional status Physical activity Advanced directives List of other physicians Hospitalizations, surgeries, and ER visits in previous 12 months Vitals Screenings to include cognitive, depression, and falls Referrals and appointments  In addition, I have reviewed and discussed with patient certain preventive protocols, quality metrics, and best practice recommendations. A written personalized care plan for preventive services as well as general preventive health recommendations were provided to patient.     TiWillette BraceLPN   08/12/77/0240 Nurse Notes: none

## 2021-08-28 NOTE — Patient Instructions (Signed)
Zachary Chandler , Thank you for taking time to come for your Medicare Wellness Visit. I appreciate your ongoing commitment to your health goals. Please review the following plan we discussed and let me know if I can assist you in the future.   Screening recommendations/referrals: Colonoscopy: completed 06/14/20 repeat every year  Recommended yearly ophthalmology/optometry visit for glaucoma screening and checkup Recommended yearly dental visit for hygiene and checkup  Vaccinations: Influenza vaccine: declined  Pneumococcal vaccine: Up to date Tdap vaccine: done 01/07/16 repeat every 10 years  Shingles vaccine: Shingrix discussed. Please contact your pharmacy for coverage information.    Covid-19: completed 9/21, 11/22/19 & 08/06/20   Advanced directives: Please bring a copy of your health care power of attorney and living will to the office at your convenience.  Conditions/risks identified: none at this time   Next appointment: Follow up in one year for your annual wellness visit.   Preventive Care 73 Years and Older, Male Preventive care refers to lifestyle choices and visits with your health care provider that can promote health and wellness. What does preventive care include? A yearly physical exam. This is also called an annual well check. Dental exams once or twice a year. Routine eye exams. Ask your health care provider how often you should have your eyes checked. Personal lifestyle choices, including: Daily care of your teeth and gums. Regular physical activity. Eating a healthy diet. Avoiding tobacco and drug use. Limiting alcohol use. Practicing safe sex. Taking low doses of aspirin every day. Taking vitamin and mineral supplements as recommended by your health care provider. What happens during an annual well check? The services and screenings done by your health care provider during your annual well check will depend on your age, overall health, lifestyle risk factors, and  family history of disease. Counseling  Your health care provider may ask you questions about your: Alcohol use. Tobacco use. Drug use. Emotional well-being. Home and relationship well-being. Sexual activity. Eating habits. History of falls. Memory and ability to understand (cognition). Work and work Statistician. Screening  You may have the following tests or measurements: Height, weight, and BMI. Blood pressure. Lipid and cholesterol levels. These may be checked every 5 years, or more frequently if you are over 73 years old. Skin check. Lung cancer screening. You may have this screening every year starting at age 73 if you have a 30-pack-year history of smoking and currently smoke or have quit within the past 15 years. Fecal occult blood test (FOBT) of the stool. You may have this test every year starting at age 73. Flexible sigmoidoscopy or colonoscopy. You may have a sigmoidoscopy every 5 years or a colonoscopy every 10 years starting at age 73. Prostate cancer screening. Recommendations will vary depending on your family history and other risks. Hepatitis C blood test. Hepatitis B blood test. Sexually transmitted disease (STD) testing. Diabetes screening. This is done by checking your blood sugar (glucose) after you have not eaten for a while (fasting). You may have this done every 1-3 years. Abdominal aortic aneurysm (AAA) screening. You may need this if you are a current or former smoker. Osteoporosis. You may be screened starting at age 73 if you are at high risk. Talk with your health care provider about your test results, treatment options, and if necessary, the need for more tests. Vaccines  Your health care provider may recommend certain vaccines, such as: Influenza vaccine. This is recommended every year. Tetanus, diphtheria, and acellular pertussis (Tdap, Td) vaccine. You may  need a Td booster every 10 years. Zoster vaccine. You may need this after age 73. Pneumococcal  13-valent conjugate (PCV13) vaccine. One dose is recommended after age 73. Pneumococcal polysaccharide (PPSV23) vaccine. One dose is recommended after age 73. Talk to your health care provider about which screenings and vaccines you need and how often you need them. This information is not intended to replace advice given to you by your health care provider. Make sure you discuss any questions you have with your health care provider. Document Released: 02/23/2015 Document Revised: 10/17/2015 Document Reviewed: 11/28/2014 Elsevier Interactive Patient Education  2017 [place] Prevention in the Home Falls can cause injuries. They can happen to people of all ages. There are many things you can do to make your home safe and to help prevent falls. What can I do on the outside of my home? Regularly fix the edges of walkways and driveways and fix any cracks. Remove anything that might make you trip as you walk through a door, such as a raised step or threshold. Trim any bushes or trees on the path to your home. Use bright outdoor lighting. Clear any walking paths of anything that might make someone trip, such as rocks or tools. Regularly check to see if handrails are loose or broken. Make sure that both sides of any steps have handrails. Any raised decks and porches should have guardrails on the edges. Have any leaves, snow, or ice cleared regularly. Use sand or salt on walking paths during winter. Clean up any spills in your garage right away. This includes oil or grease spills. What can I do in the bathroom? Use night lights. Install grab bars by the toilet and in the tub and shower. Do not use towel bars as grab bars. Use non-skid mats or decals in the tub or shower. If you need to sit down in the shower, use a plastic, non-slip stool. Keep the floor dry. Clean up any water that spills on the floor as soon as it happens. Remove soap buildup in the tub or shower regularly. Attach  bath mats securely with double-sided non-slip rug tape. Do not have throw rugs and other things on the floor that can make you trip. What can I do in the bedroom? Use night lights. Make sure that you have a light by your bed that is easy to reach. Do not use any sheets or blankets that are too big for your bed. They should not hang down onto the floor. Have a firm chair that has side arms. You can use this for support while you get dressed. Do not have throw rugs and other things on the floor that can make you trip. What can I do in the kitchen? Clean up any spills right away. Avoid walking on wet floors. Keep items that you use a lot in easy-to-reach places. If you need to reach something above you, use a strong step stool that has a grab bar. Keep electrical cords out of the way. Do not use floor polish or wax that makes floors slippery. If you must use wax, use non-skid floor wax. Do not have throw rugs and other things on the floor that can make you trip. What can I do with my stairs? Do not leave any items on the stairs. Make sure that there are handrails on both sides of the stairs and use them. Fix handrails that are broken or loose. Make sure that handrails are as long as the stairways.  Check any carpeting to make sure that it is firmly attached to the stairs. Fix any carpet that is loose or worn. Avoid having throw rugs at the top or bottom of the stairs. If you do have throw rugs, attach them to the floor with carpet tape. Make sure that you have a light switch at the top of the stairs and the bottom of the stairs. If you do not have them, ask someone to add them for you. What else can I do to help prevent falls? Wear shoes that: Do not have high heels. Have rubber bottoms. Are comfortable and fit you well. Are closed at the toe. Do not wear sandals. If you use a stepladder: Make sure that it is fully opened. Do not climb a closed stepladder. Make sure that both sides of the  stepladder are locked into place. Ask someone to hold it for you, if possible. Clearly mark and make sure that you can see: Any grab bars or handrails. First and last steps. Where the edge of each step is. Use tools that help you move around (mobility aids) if they are needed. These include: Canes. Walkers. Scooters. Crutches. Turn on the lights when you go into a dark area. Replace any light bulbs as soon as they burn out. Set up your furniture so you have a clear path. Avoid moving your furniture around. If any of your floors are uneven, fix them. If there are any pets around you, be aware of where they are. Review your medicines with your doctor. Some medicines can make you feel dizzy. This can increase your chance of falling. Ask your doctor what other things that you can do to help prevent falls. This information is not intended to replace advice given to you by your health care provider. Make sure you discuss any questions you have with your health care provider. Document Released: 11/23/2008 Document Revised: 07/05/2015 Document Reviewed: 03/03/2014 Elsevier Interactive Patient Education  2017 [employer].

## 2021-09-23 ENCOUNTER — Encounter: Payer: Self-pay | Admitting: Family Medicine

## 2021-09-23 ENCOUNTER — Ambulatory Visit (INDEPENDENT_AMBULATORY_CARE_PROVIDER_SITE_OTHER): Payer: Medicare HMO | Admitting: Family Medicine

## 2021-09-23 VITALS — BP 128/70 | HR 104 | Temp 97.5°F | Ht 73.0 in | Wt 251.0 lb

## 2021-09-23 DIAGNOSIS — M79671 Pain in right foot: Secondary | ICD-10-CM

## 2021-09-23 DIAGNOSIS — M109 Gout, unspecified: Secondary | ICD-10-CM | POA: Diagnosis not present

## 2021-09-23 MED ORDER — PREDNISONE 20 MG PO TABS: 40.0000 mg | ORAL_TABLET | Freq: Every day | ORAL | 0 refills | Status: AC

## 2021-09-23 NOTE — Patient Instructions (Signed)
Gout  Gout is painful swelling of your joints. Gout is a type of arthritis. It is caused by having too much uric acid in your body. Uric acid is a chemical that is made when your body breaks down substances called purines. If your body has too much uric acid, sharp crystals can form and build up in your joints. This causes pain and swelling. Gout attacks can happen quickly and be very painful (acute gout). Over time, the attacks can affect more joints and happen more often (chronic gout). What are the causes? Gout is caused by too much uric acid in your blood. This can happen because: Your kidneys do not remove enough uric acid from your blood. Your body makes too much uric acid. You eat too many foods that are high in purines. These foods include organ meats, some seafood, and beer. Trauma or stress can bring on an attack. What increases the risk? Having a family history of gout. Being male and middle-aged. Being male and having gone through menopause. Having an organ transplant. Taking certain medicines. Having certain conditions, such as: Being very overweight (obese). Lead poisoning. Kidney disease. A skin condition called psoriasis. Other risks include: Losing weight too quickly. Not having enough water in the body (being dehydrated). Drinking alcohol, especially beer. Drinking beverages that are sweetened with a type of sugar called fructose. What are the signs or symptoms? An attack of acute gout often starts at night and usually happens in just one joint. The most common place is the big toe. Other joints that may be affected include joints of the feet, ankle, knee, fingers, wrist, or elbow. Symptoms may include: Very bad pain. Warmth. Swelling. Stiffness. Tenderness. The affected joint may be very painful to touch. Shiny, red, or purple skin. Chills and fever. Chronic gout may cause symptoms more often. More joints may be involved. You may also have white or yellow lumps  (tophi) on your hands or feet or in other areas near your joints. How is this treated? Treatment for an acute attack may include medicines for pain and swelling, such as: NSAIDs, such as ibuprofen. Steroids taken by mouth or injected into a joint. Colchicine. This can be given by mouth or through an IV tube. Treatment to prevent future attacks may include: Taking small doses of NSAIDs or colchicine daily. Using a medicine that reduces uric acid levels in your blood, such as allopurinol. Making changes to your diet. You may need to see a food expert (dietitian) about what to eat and drink to prevent gout. Follow these instructions at home: During a gout attack  If told, put ice on the painful area. To do this: Put ice in a plastic bag. Place a towel between your skin and the bag. Leave the ice on for 20 minutes, 2-3 times a day. Take off the ice if your skin turns bright red. This is very important. If you cannot feel pain, heat, or cold, you have a greater risk of damage to the area. Raise the painful joint above the level of your heart as often as you can. Rest the joint as much as possible. If the joint is in your leg, you may be given crutches. Follow instructions from your doctor about what you cannot eat or drink. Avoiding future gout attacks Eat a low-purine diet. Avoid foods and drinks such as: Liver. Kidney. Anchovies. Asparagus. Herring. Mushrooms. Mussels. Beer. Stay at a healthy weight. If you want to lose weight, talk with your doctor. Do not   lose weight too fast. Start or continue an exercise plan as told by your doctor. Eating and drinking Avoid drinks sweetened by fructose. Drink enough fluids to keep your pee (urine) pale yellow. If you drink alcohol: Limit how much you have to: 0-1 drink a day for women who are not pregnant. 0-2 drinks a day for men. Know how much alcohol is in a drink. In the [place] one drink equals one 12 oz bottle of beer (355 mL), one 5 oz  glass of wine (148 mL), or one 1 oz glass of hard liquor (44 mL). General instructions Take over-the-counter and prescription medicines only as told by your doctor. Ask your doctor if you should avoid driving or using machines while you are taking your medicine. Return to your normal activities when your doctor says that it is safe. Keep all follow-up visits. Where to find more information National Institutes of Health: www.niams.nih.gov Contact a doctor if: You have another gout attack. You still have symptoms of a gout attack after 10 days of treatment. You have problems (side effects) because of your medicines. You have chills or a fever. You have burning pain when you pee (urinate). You have pain in your lower back or belly. Get help right away if: You have very bad pain. Your pain cannot be controlled. You cannot pee. Summary Gout is painful swelling of the joints. The most common site of pain is the big toe, but it can affect other joints. Medicines and avoiding some foods can help to prevent and treat gout attacks. This information is not intended to replace advice given to you by your health care provider. Make sure you discuss any questions you have with your health care provider. Document Revised: 10/31/2020 Document Reviewed: 10/31/2020 Elsevier Patient Education  2023 Elsevier Inc.  

## 2021-09-23 NOTE — Progress Notes (Signed)
Zachary Chandler , 1950-11-08, 71 y.o., male MRN: [id number] Patient Care Team    Relationship Specialty Notifications Start End  McGowen, Adrian Blackwater, MD PCP - General Family Medicine  02/28/13    Comment: Jim Like, Lerry Paterson, MD Consulting Physician Ophthalmology  08/12/15   Carolan Clines, MD (Inactive) Consulting Physician Urology  05/12/16   Marchia Bond, MD Consulting Physician Orthopedic Surgery  03/29/19   Coralie Keens, MD Consulting Physician General Surgery  01/30/20   [name], MD Consulting Physician Dermatology  08/08/20     Chief Complaint  Patient presents with   Foot Pain    Pt c/o R foot pain x 2 days;      Subjective: Pt presents for an OV with complaints of right foot pain that started on Saturday.  He states it was red and swollen consistent with prior history of gout.  Typically he is prescribed prednisone 40 mg daily x5 days and is able to achieve resolution of symptoms.  He reports he did have some prednisone left over from last time and took 40 mg yesterday.  He denies any fevers or chills.  He denies any recent injury.     08/28/2021   11:29 AM 06/26/2021    8:47 AM 12/06/2020    9:04 AM 09/06/2020    8:29 AM 08/22/2020    2:32 PM  Depression screen PHQ 2/9  Decreased Interest 0 0 0 0 0  Down, Depressed, Hopeless 0 0 0 0 0  PHQ - 2 Score 0 0 0 0 0    No Known Allergies Social History   Social History Narrative   Married, has one 10 y/o son.   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     Orig from [place].   Was in [employer] [phone].  No combat duty.  No VA care.   Smoked 10-12 yrs, quit in his early 67s.   Alcohol: none   No hx of drugs.            Past Medical History:  Diagnosis Date   Carpal tunnel syndrome on left    Cataract 07/2015   R eye--surgery recommended by his eye MD 08/03/15   Chronic fatigue    Chronic renal insufficiency, stage 3 (moderate) ([place]) 2021   Cr 1.2-1.4, GFR 50s-60s    Closed fracture of greater tuberosity of right humerus 02/28/2019   Immobil w/sling (Dr. Mardelle Matte)   Colon cancer screening 02/15/2018   iFOB neg 02/2018 and 06/2020.   Erectile dysfunction    Dr. Gaynelle Arabian.  Testosterone normal 04/2016 at [facility].   Gout of big toe 1990s   only one episode; he cut back on peanut butter and hasn't had any prob since   Herpes simplex ophthalmicus    L eye, has opthalmologist, uses prednisone drops prn   History of gallstones    per pt report   History of kidney stones    History of prostatitis 1980s   Remote hx of seeing Dr. Terance Hart at [facility]--then Dr. Gaynelle Arabian.    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 Dr. Denna Haggard   Shingles    recurrent L thorax   Sinus disease    Thalassemia    Mild anemia with MCV 60s (alpha thal minor or beta thal trait: Hb electrophoresis not done/no result available.).   Type 2 diabetes mellitus  with microalbuminuria ([place]) Dx'd 08/2013   Past Surgical History:  Procedure Laterality Date   CHOLECYSTECTOMY N/A 01/26/2020   Procedure: LAPAROSCOPIC CHOLECYSTECTOMY;  Surgeon: Coralie Keens, MD;  Location: [facility];  Service: General;  Laterality: N/A;   NASAL POLYP EXCISION  1960   WISDOM TOOTH EXTRACTION     Family History  Problem Relation Age of Onset   Heart disease Mother    Hypertension Mother    Diabetes Mother    Heart disease Father    Heart attack Sister    Allergies as of 09/23/2021   No Known Allergies      Medication List        Accurate as of September 23, 2021  9:27 AM. If you have any questions, ask your nurse or doctor.          4-Way Fast Acting 1 % nasal spray Generic drug: phenylephrine Place 1 drop into both nostrils every 6 (six) hours as needed for congestion.   acetaminophen 500 MG tablet Commonly known as: TYLENOL Take 500-1,000 mg by mouth every 6 (six) hours as needed  (pain.).   amLODipine 10 MG tablet Commonly known as: NORVASC TAKE 1 TABLET DAILY   atorvastatin 20 MG tablet Commonly known as: LIPITOR Take 1 tablet (20 mg total) by mouth daily.   cetirizine 10 MG tablet Commonly known as: ZYRTEC Take 10 mg by mouth daily.   cholestyramine 4 g packet Commonly known as: QUESTRAN Take 1 packet (4 g total) by mouth once a week.   freestyle lancets Use as instructed   glucose blood test strip Use as instructed   hydrochlorothiazide 25 MG tablet Commonly known as: HYDRODIURIL TAKE 1 TABLET DAILY   hydrocortisone valerate ointment 0.2 % Commonly known as: WEST-CORT Appy bid to affected area prn   lisinopril 10 MG tablet Commonly known as: ZESTRIL Take 1 tablet (10 mg total) by mouth daily.   metFORMIN 1000 MG tablet Commonly known as: GLUCOPHAGE Take 1 tablet (1,000 mg total) by mouth 2 (two) times daily with a meal.   pioglitazone 45 MG tablet Commonly known as: ACTOS Take 1 tablet (45 mg total) by mouth daily.   prednisoLONE acetate 1 % ophthalmic suspension Commonly known as: PRED FORTE Place 1 drop into both eyes daily as needed (eye irritation).   predniSONE 20 MG tablet Commonly known as: DELTASONE Take 2 tablets (40 mg total) by mouth daily with breakfast for 6 days. Started by: Howard Pouch, DO        All past medical history, surgical history, allergies, family history, immunizations andmedications were updated in the EMR today and reviewed under the history and medication portions of their EMR.     ROS Negative, with the exception of above mentioned in HPI   Objective:  BP 128/70   Pulse (!) 104   Temp (!) 97.5 F (36.4 C) (Oral)   Ht '6\' 1"'$  (1.854 m)   Wt 251 lb (113.9 kg)   SpO2 98%   BMI 33.12 kg/m  Body mass index is 33.12 kg/m. Physical Exam Vitals and nursing note reviewed.  Constitutional:      General: He is not in acute distress.    Appearance: Normal appearance. He is not ill-appearing,  toxic-appearing or diaphoretic.  HENT:     Head: Normocephalic and atraumatic.  Eyes:     General: No scleral icterus.       Right eye: No discharge.        Left eye: No discharge.  Extraocular Movements: Extraocular movements intact.     Pupils: Pupils are equal, round, and reactive to light.  Musculoskeletal:        General: Swelling and tenderness present.     Comments: Right foot: Mild swelling, mild erythema along right lateral foot/fifth metatarsal.  Skin:    General: Skin is warm and dry.     Coloration: Skin is not jaundiced or pale.     Findings: Erythema present. No rash.  Neurological:     Mental Status: He is alert and oriented to person, place, and time. Mental status is at baseline.  Psychiatric:        Mood and Affect: Mood normal.        Behavior: Behavior normal.        Thought Content: Thought content normal.        Judgment: Judgment normal.     No results found. No results found. No results found for this or any previous visit (from the past 24 hour(s)).  Assessment/Plan: CHANDON LAZCANO is a 71 y.o. male present for OV for  Acute gout involving toe, unspecified cause, unspecified laterality/ Right foot pain Patient has a known history of gout.  He started to notice a gout flare 2 days ago.  Took 1 dose of prednisone and states it is already starting to improve. Prednisone 40 mg daily x3-5 days prescribed. Follow-up with PCP in 1 week if not resolved.  Reviewed expectations re: course of current medical issues. Discussed self-management of symptoms. Outlined signs and symptoms indicating need for more acute intervention. Patient verbalized understanding and all questions were answered. Patient received an After-Visit Summary.    No orders of the defined types were placed in this encounter.  Meds ordered this encounter  Medications   predniSONE (DELTASONE) 20 MG tablet    Sig: Take 2 tablets (40 mg total) by mouth daily with breakfast for 6  days.    Dispense:  12 tablet    Refill:  0   Referral Orders  No referral(s) requested today     Note is dictated utilizing voice recognition software. Although note has been proof read prior to signing, occasional typographical errors still can be missed. If any questions arise, please do not hesitate to call for verification.   electronically signed by:  Howard Pouch, DO  [place]

## 2021-09-26 ENCOUNTER — Ambulatory Visit: Payer: Medicare HMO | Admitting: Family Medicine

## 2021-10-08 ENCOUNTER — Encounter: Payer: Self-pay | Admitting: Family Medicine

## 2021-10-08 ENCOUNTER — Ambulatory Visit (INDEPENDENT_AMBULATORY_CARE_PROVIDER_SITE_OTHER): Payer: Medicare HMO | Admitting: Family Medicine

## 2021-10-08 VITALS — BP 137/82 | HR 85 | Temp 97.9°F | Ht 73.0 in | Wt 251.4 lb

## 2021-10-08 DIAGNOSIS — R0981 Nasal congestion: Secondary | ICD-10-CM | POA: Diagnosis not present

## 2021-10-08 DIAGNOSIS — I10 Essential (primary) hypertension: Secondary | ICD-10-CM | POA: Diagnosis not present

## 2021-10-08 DIAGNOSIS — N1831 Chronic kidney disease, stage 3a: Secondary | ICD-10-CM

## 2021-10-08 DIAGNOSIS — E119 Type 2 diabetes mellitus without complications: Secondary | ICD-10-CM | POA: Diagnosis not present

## 2021-10-08 DIAGNOSIS — Z1211 Encounter for screening for malignant neoplasm of colon: Secondary | ICD-10-CM | POA: Diagnosis not present

## 2021-10-08 DIAGNOSIS — E7800 Pure hypercholesterolemia, unspecified: Secondary | ICD-10-CM | POA: Diagnosis not present

## 2021-10-08 DIAGNOSIS — J342 Deviated nasal septum: Secondary | ICD-10-CM

## 2021-10-08 LAB — POCT GLYCOSYLATED HEMOGLOBIN (HGB A1C)
HbA1c POC (<> result, manual entry): 6.7 % (ref 4.0–5.6)
HbA1c, POC (controlled diabetic range): 6.7 % (ref 0.0–7.0)
HbA1c, POC (prediabetic range): 6.7 % — AB (ref 5.7–6.4)
Hemoglobin A1C: 6.7 % — AB (ref 4.0–5.6)

## 2021-10-08 LAB — FECAL OCCULT BLOOD, IMMUNOCHEMICAL: Fecal Occult Bld: NEGATIVE

## 2021-10-08 NOTE — Progress Notes (Signed)
OFFICE VISIT  10/08/2021  CC:  Chief Complaint  Patient presents with   Diabetes    Pt is fasting. A1c check today   Hypertension   Hyperlipidemia   Patient is a 71 y.o. male who presents for 3 mo f/u DM, HTN, HLD, CRI. A/P as of last visit: "1) HTN, well controlled on amlod 10 qd, hctz 25 qd, lisin 10 qd. Lytes/cr today.   2) DM 2, history of microalbuminuria. Feet exam normal today. Hemoglobin A1c and urine microalbumin/creatinine ordered today. Continue metformin 1000 mg twice daily and pioglitazone 45 mg daily.   #3 hyperlipidemia.  Doing well on atorvastatin 20 mg a day. Lipid panel and hepatic panel today.  4.  Chronic renal insufficiency stage III.  Avoid NSAIDs.  Hydrate well. Lites and creatinine today.   5. Health maintenance exam: Reviewed age and gender appropriate health maintenance issues (prudent diet, regular exercise, health risks of tobacco and excessive alcohol, use of seatbelts, fire alarms in home, use of sunscreen).  Also reviewed age and gender appropriate health screening as well as vaccine recommendations. Vaccines: Shingrix-pt declined.  O/W UTD. Labs: cbc,bmet,Hba1c,urine microalb/cr Prostate ca screening: PSA today Colon ca screening: iFOB neg x 2 in the last few years, most recent May 2022-->iFOB ordered."  INTERIM HX: Doing well. Occasional home blood pressure checks consistently 120s to 130s over 70s to 80s. No home glucose monitoring. He has improved his diet some since last visit.  He saw ENT a couple months ago for his chronic nasal congestion. After the evaluation was done it was told that he does not have any sinusitis but has inflamed right nasal passage and nasal septum deviation to the right.  Surgery was presented as an option but patient was not pleased with the interaction he had and is not sure about proceeding.  He controls his symptoms with regular use of nasal lavage and phenylephrine nasal drops.  Past Medical History:   Diagnosis Date   Carpal tunnel syndrome on left    Cataract 07/2015   R eye--surgery recommended by his eye MD 08/03/15   Chronic fatigue    Chronic renal insufficiency, stage 3 (moderate) ([place]) 2021   Cr 1.2-1.4, GFR 50s-60s   Closed fracture of greater tuberosity of right humerus 02/28/2019   Immobil w/sling (Dr. Mardelle Matte)   Colon cancer screening 02/15/2018   iFOB neg 02/2018 and 06/2020.   Erectile dysfunction    Dr. Gaynelle Arabian.  Testosterone [facility] 04/2016 at [facility].   Gout of big toe 1990s   only one episode; he cut back on peanut butter and hasn't had any prob since   Herpes simplex ophthalmicus    L eye, has opthalmologist, uses prednisone drops prn   History of gallstones    per pt report   History of kidney stones    History of prostatitis 1980s   Remote hx of seeing Dr. Terance Hart at [facility]--then Dr. Gaynelle Arabian.    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 Dr. Denna Haggard   Shingles    recurrent L thorax   Sinus disease    Thalassemia    Mild anemia with MCV 60s (alpha thal minor or beta thal trait: Hb electrophoresis not done/no result available.).   Type 2 diabetes mellitus with microalbuminuria ([place]) Dx'd 08/2013    Past Surgical History:  Procedure Laterality Date   CHOLECYSTECTOMY N/A 01/26/2020   Procedure: LAPAROSCOPIC CHOLECYSTECTOMY;  Surgeon:  Coralie Keens, MD;  Location: [facility];  Service: General;  Laterality: N/A;   NASAL POLYP EXCISION  1960   WISDOM TOOTH EXTRACTION      Outpatient Medications Prior to Visit  Medication Sig Dispense Refill   acetaminophen (TYLENOL) 500 MG tablet Take 500-1,000 mg by mouth every 6 (six) hours as needed (pain.).     amLODipine (NORVASC) 10 MG tablet TAKE 1 TABLET DAILY 90 tablet 0   atorvastatin (LIPITOR) 20 MG tablet Take 1 tablet (20 mg total) by mouth daily. 90 tablet 1   cetirizine (ZYRTEC) 10 MG tablet Take 10  mg by mouth daily.     cholestyramine (QUESTRAN) 4 g packet Take 1 packet (4 g total) by mouth once a week. 21 each 3   glucose blood test strip Use as instructed 100 each 6   hydrochlorothiazide (HYDRODIURIL) 25 MG tablet TAKE 1 TABLET DAILY 90 tablet 1   hydrocortisone valerate ointment (WEST-CORT) 0.2 % Appy bid to affected area prn 15 g 2   Lancets (FREESTYLE) lancets Use as instructed 100 each 12   lisinopril (ZESTRIL) 10 MG tablet Take 1 tablet (10 mg total) by mouth daily. 90 tablet 1   metFORMIN (GLUCOPHAGE) 1000 MG tablet Take 1 tablet (1,000 mg total) by mouth 2 (two) times daily with a meal. 180 tablet 1   phenylephrine (4-WAY FAST ACTING) 1 % nasal spray Place 1 drop into both nostrils every 6 (six) hours as needed for congestion.     pioglitazone (ACTOS) 45 MG tablet Take 1 tablet (45 mg total) by mouth daily. 90 tablet 1   prednisoLONE acetate (PRED FORTE) 1 % ophthalmic suspension Place 1 drop into both eyes daily as needed (eye irritation).     No facility-administered medications prior to visit.    No Known Allergies  ROS As per HPI  PE:    10/08/2021    8:54 AM 09/23/2021    9:03 AM 06/26/2021    8:40 AM  Vitals with BMI  Height '6\' 1"'$  '6\' 1"'$  6' 1.5"  Weight 251 lbs 6 oz 251 lbs 250 lbs  BMI 33.18 93.23 55.73  Systolic 220 254 270  Diastolic 82 70 76  Pulse 85 104 80   Physical Exam  Gen: Alert, well appearing.  Patient is oriented to person, place, time, and situation. AFFECT: pleasant, lucid thought and speech. CV: RRR, no m/r/g.   LUNGS: CTA bilat, nonlabored resps, good aeration in all lung fields.. EXT: no clubbing or cyanosis.  no edema.    LABS:  Last CBC Lab Results  Component Value Date   WBC 5.9 06/26/2021   HGB 10.8 (L) 06/26/2021   HCT 34.8 (L) 06/26/2021   MCV 62.2 Repeated and verified X2. (L) 06/26/2021   MCH 19.3 (L) 03/07/2020   RDW 16.4 (H) 06/26/2021   PLT 241.0 06/26/2021   Lab Results  Component Value Date   IRON 187 (H)  07/09/2016   FERRITIN 260.1 62/37/6283   Last metabolic panel Lab Results  Component Value Date   GLUCOSE 143 (H) 06/26/2021   NA 140 06/26/2021   K 4.1 06/26/2021   CL 105 06/26/2021   CO2 24 06/26/2021   BUN 27 (H) 06/26/2021   CREATININE 1.28 06/26/2021   GFRNONAA 59 09/05/2020   CALCIUM 9.3 06/26/2021   PROT 6.6 03/20/2021   ALBUMIN 4.6 07/08/2016   BILITOT 0.9 03/20/2021   ALKPHOS 48 07/08/2016   AST 11 03/20/2021   ALT 14 03/20/2021   ANIONGAP 13  01/24/2020   Last lipids Lab Results  Component Value Date   CHOL 117 03/20/2021   HDL 40 03/20/2021   LDLCALC 55 03/20/2021   TRIG 140 03/20/2021   CHOLHDL 2.9 03/20/2021   Last hemoglobin A1c Lab Results  Component Value Date   HGBA1C 6.7 (A) 10/08/2021   HGBA1C 6.7 10/08/2021   HGBA1C 6.7 (A) 10/08/2021   HGBA1C 6.7 10/08/2021   Last thyroid functions Lab Results  Component Value Date   TSH 0.84 07/24/2017   Lab Results  Component Value Date   PSA 0.69 06/26/2021   PSA 0.93 05/31/2019   PSA 0.86 02/01/2018   IMPRESSION AND PLAN:  #1 diabetes without complication--good control. POC Hba1c today is 6.7%.  Continue pioglitazone 45 mg a day and metformin 1000 mg twice a day. Electrolytes and creatinine today.  2.  Hypertension, well controlled on amlodipine 10 mg a day, HCTZ 25 mg a day, and lisinopril 10 mg a day. Electrolytes and creatinine today.  3.  Hyperlipidemia, doing well on atorvastatin 20 mg a day. Lipid and hepatic panel today.  4.  Chronic renal insufficiency stage III. Avoid NSAIDs. Emphasized importance of good hydration. Monitor electrolytes and creatinine today.  #5 chronic nasal congestion, localized only to the right side.  He has deviated septum, small osseous nasal spur seen on CT. He saw ENT 08/2021 who stated that septoplasty is and option for him but he is not sure how to proceed. He requests referral to a different ENT for second opinion.  #6 gout. He had a flare in the  right foot about 2 weeks ago.  This resolved well with prednisone.  An After Visit Summary was printed and given to the patient.  FOLLOW UP: Return in about 3 months (around 01/08/2022) for routine chronic illness f/u. Next cpe 06/2022  Signed:  Crissie Sickles, MD           10/08/2021

## 2021-10-09 ENCOUNTER — Encounter: Payer: Self-pay | Admitting: Family Medicine

## 2021-10-09 LAB — COMPREHENSIVE METABOLIC PANEL
ALT: 13 U/L (ref 0–53)
AST: 12 U/L (ref 0–37)
Albumin: 4.1 g/dL (ref 3.5–5.2)
Alkaline Phosphatase: 62 U/L (ref 39–117)
BUN: 16 mg/dL (ref 6–23)
CO2: 23 mEq/L (ref 19–32)
Calcium: 8.9 mg/dL (ref 8.4–10.5)
Chloride: 107 mEq/L (ref 96–112)
Creatinine, Ser: 1.1 mg/dL (ref 0.40–1.50)
GFR: 66.65 mL/min (ref >60.00)
Glucose, Bld: 164 mg/dL — ABNORMAL HIGH (ref 70–99)
Potassium: 4 mEq/L (ref 3.5–5.1)
Sodium: 140 mEq/L (ref 135–145)
Total Bilirubin: 1 mg/dL (ref 0.2–1.2)
Total Protein: 6.6 g/dL (ref 6.0–8.3)

## 2021-10-09 LAB — LIPID PANEL
Cholesterol: 124 mg/dL (ref 0–200)
HDL: 44 mg/dL (ref >39.00)
LDL Cholesterol: 58 mg/dL (ref 0–99)
NonHDL: 80.43
Total CHOL/HDL Ratio: 3
Triglycerides: 112 mg/dL (ref 0.0–149.0)
VLDL: 22.4 mg/dL (ref 0.0–40.0)

## 2021-10-21 DIAGNOSIS — H524 Presbyopia: Secondary | ICD-10-CM | POA: Diagnosis not present

## 2021-10-21 DIAGNOSIS — H52223 Regular astigmatism, bilateral: Secondary | ICD-10-CM | POA: Diagnosis not present

## 2021-10-21 DIAGNOSIS — Z961 Presence of intraocular lens: Secondary | ICD-10-CM | POA: Diagnosis not present

## 2021-10-21 DIAGNOSIS — H5203 Hypermetropia, bilateral: Secondary | ICD-10-CM | POA: Diagnosis not present

## 2021-10-21 DIAGNOSIS — H0100A Unspecified blepharitis right eye, upper and lower eyelids: Secondary | ICD-10-CM | POA: Diagnosis not present

## 2021-10-21 DIAGNOSIS — H0100B Unspecified blepharitis left eye, upper and lower eyelids: Secondary | ICD-10-CM | POA: Diagnosis not present

## 2021-10-21 DIAGNOSIS — E119 Type 2 diabetes mellitus without complications: Secondary | ICD-10-CM | POA: Diagnosis not present

## 2021-10-21 DIAGNOSIS — H35363 Drusen (degenerative) of macula, bilateral: Secondary | ICD-10-CM | POA: Diagnosis not present

## 2021-11-04 DIAGNOSIS — J3489 Other specified disorders of nose and nasal sinuses: Secondary | ICD-10-CM | POA: Diagnosis not present

## 2021-11-04 DIAGNOSIS — J342 Deviated nasal septum: Secondary | ICD-10-CM | POA: Diagnosis not present

## 2021-11-04 DIAGNOSIS — J310 Chronic rhinitis: Secondary | ICD-10-CM | POA: Diagnosis not present

## 2021-11-09 ENCOUNTER — Other Ambulatory Visit: Payer: Self-pay | Admitting: Family Medicine

## 2021-11-16 ENCOUNTER — Other Ambulatory Visit: Payer: Self-pay | Admitting: Family Medicine

## 2021-12-10 ENCOUNTER — Other Ambulatory Visit: Payer: Self-pay | Admitting: Family Medicine

## 2022-01-06 DIAGNOSIS — J342 Deviated nasal septum: Secondary | ICD-10-CM | POA: Diagnosis not present

## 2022-01-06 DIAGNOSIS — Z9889 Other specified postprocedural states: Secondary | ICD-10-CM | POA: Diagnosis not present

## 2022-01-06 DIAGNOSIS — J310 Chronic rhinitis: Secondary | ICD-10-CM | POA: Diagnosis not present

## 2022-01-06 DIAGNOSIS — J3489 Other specified disorders of nose and nasal sinuses: Secondary | ICD-10-CM | POA: Diagnosis not present

## 2022-01-09 ENCOUNTER — Encounter: Payer: Self-pay | Admitting: Family Medicine

## 2022-01-09 ENCOUNTER — Ambulatory Visit (INDEPENDENT_AMBULATORY_CARE_PROVIDER_SITE_OTHER): Payer: Medicare HMO | Admitting: Family Medicine

## 2022-01-09 ENCOUNTER — Telehealth: Payer: Self-pay

## 2022-01-09 VITALS — BP 134/70 | HR 81 | Temp 98.4°F | Ht 73.0 in | Wt 255.0 lb

## 2022-01-09 DIAGNOSIS — M10072 Idiopathic gout, left ankle and foot: Secondary | ICD-10-CM

## 2022-01-09 DIAGNOSIS — R809 Proteinuria, unspecified: Secondary | ICD-10-CM | POA: Diagnosis not present

## 2022-01-09 DIAGNOSIS — E1129 Type 2 diabetes mellitus with other diabetic kidney complication: Secondary | ICD-10-CM

## 2022-01-09 DIAGNOSIS — N1831 Chronic kidney disease, stage 3a: Secondary | ICD-10-CM

## 2022-01-09 DIAGNOSIS — I10 Essential (primary) hypertension: Secondary | ICD-10-CM | POA: Diagnosis not present

## 2022-01-09 LAB — POCT GLYCOSYLATED HEMOGLOBIN (HGB A1C)
HbA1c POC (<> result, manual entry): 6.9 % (ref 4.0–5.6)
HbA1c, POC (controlled diabetic range): 6.9 % (ref 0.0–7.0)
HbA1c, POC (prediabetic range): 6.9 % — AB (ref 5.7–6.4)
Hemoglobin A1C: 6.9 % — AB (ref 4.0–5.6)

## 2022-01-09 MED ORDER — PREDNISONE 20 MG PO TABS: ORAL_TABLET | ORAL | 1 refills | Status: DC

## 2022-01-09 MED ORDER — PIOGLITAZONE HCL 45 MG PO TABS: 45.0000 mg | ORAL_TABLET | Freq: Every day | ORAL | 0 refills | Status: DC

## 2022-01-09 MED ORDER — LISINOPRIL 10 MG PO TABS: 10.0000 mg | ORAL_TABLET | Freq: Every day | ORAL | 1 refills | Status: DC

## 2022-01-09 MED ORDER — AMLODIPINE BESYLATE 10 MG PO TABS: 10.0000 mg | ORAL_TABLET | Freq: Every day | ORAL | 0 refills | Status: DC

## 2022-01-09 MED ORDER — METFORMIN HCL 1000 MG PO TABS: 1000.0000 mg | ORAL_TABLET | Freq: Two times a day (BID) | ORAL | 1 refills | Status: DC

## 2022-01-09 MED ORDER — ATORVASTATIN CALCIUM 20 MG PO TABS: 20.0000 mg | ORAL_TABLET | Freq: Every day | ORAL | 0 refills | Status: DC

## 2022-01-09 NOTE — Progress Notes (Signed)
OFFICE VISIT  01/09/2022  CC:  Chief Complaint  Patient presents with   Diabetes    Rci; pt is fasting    Patient is a 71 y.o. male who presents for 3 mo f/u DM, HTN, CRI III A/P as of last visit:  #1 diabetes without complication--good control. POC Hba1c today is 6.7%.  Continue pioglitazone 45 mg a day and metformin 1000 mg twice a day. Electrolytes and creatinine today.   2.  Hypertension, well controlled on amlodipine 10 mg a day, HCTZ 25 mg a day, and lisinopril 10 mg a day. Electrolytes and creatinine today.   3.  Hyperlipidemia, doing well on atorvastatin 20 mg a day. Lipid and hepatic panel today.   4.  Chronic renal insufficiency stage III. Avoid NSAIDs. Emphasized importance of good hydration. Monitor electrolytes and creatinine today.   #5 chronic nasal congestion, localized only to the right side.  He has deviated septum, small osseous nasal spur seen on CT. He saw ENT 08/2021 who stated that septoplasty is and option for him but he is not sure how to proceed. He requests referral to a different ENT for second opinion.   #6 gout. He had a flare in the right foot about 2 weeks ago.  This resolved well with prednisone."  INTERIM HX: All labs stable last visit.  He feels well. No home glucose monitoring. Occ bp check: normal.  ENT update: switched to flonase, given some prednisone, much improved 2 wks later. Says he'll still need surgery.  L foot gout flare last week, relatively mild, took prednisone '20mg'$  he had leftover and it helped it completely resolve.  ROS as above, plus--> no fevers, no CP, no SOB, no wheezing, no cough, no dizziness, no HAs, no rashes, no melena/hematochezia.  No polyuria or polydipsia.  No myalgias or arthralgias.  No focal weakness, paresthesias, or tremors.  No acute vision or hearing abnormalities.  No dysuria or unusual/new urinary urgency or frequency.  No recent changes in lower legs. No n/v/d or abd pain.  No palpitations.     Past Medical History:  Diagnosis Date   Carpal tunnel syndrome on left    Cataract 07/2015   R eye--surgery recommended by his eye MD 08/03/15   Chronic fatigue    Chronic renal insufficiency, stage 3 (moderate) ([place]) 2021   Cr 1.2-1.4, GFR 50s-60s   Closed fracture of greater tuberosity of right humerus 02/28/2019   Immobil w/sling (Dr. Mardelle Matte)   Colon cancer screening 02/15/2018   iFOB neg 02/2018,  06/2020, and 09/2021.   Erectile dysfunction    Dr. Gaynelle Arabian.  Testosterone normal 04/2016 at [facility].   Gout of big toe 1990s   only one episode; he cut back on peanut butter and hasn't had any prob since   Herpes simplex ophthalmicus    L eye, has opthalmologist, uses prednisone drops prn   History of gallstones    per pt report   History of kidney stones    History of prostatitis 1980s   Remote hx of seeing Dr. Terance Hart at [facility]--then Dr. Gaynelle Arabian.    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 Dr. Denna Haggard   Shingles    recurrent L thorax   Sinus disease    Thalassemia    Mild anemia with MCV 60s (alpha thal minor or beta thal trait: Hb electrophoresis not done/no result available.).   Type 2 diabetes  mellitus with microalbuminuria ([place]) Dx'd 08/2013    Past Surgical History:  Procedure Laterality Date   CHOLECYSTECTOMY N/A 01/26/2020   Procedure: LAPAROSCOPIC CHOLECYSTECTOMY;  Surgeon: Coralie Keens, MD;  Location: [facility];  Service: General;  Laterality: N/A;   NASAL POLYP [place] EXTRACTION      Outpatient Medications Prior to Visit  Medication Sig Dispense Refill   acetaminophen (TYLENOL) 500 MG tablet Take 500-1,000 mg by mouth every 6 (six) hours as needed (pain.).     amLODipine (NORVASC) 10 MG tablet TAKE 1 TABLET DAILY 90 tablet 0   atorvastatin (LIPITOR) 20 MG tablet TAKE 1 TABLET DAILY 90 tablet 0   cetirizine (ZYRTEC) 10 MG  tablet Take 10 mg by mouth daily.     cholestyramine (QUESTRAN) 4 g packet Take 1 packet (4 g total) by mouth once a week. 21 each 3   fluticasone (FLONASE) 50 MCG/ACT nasal spray Place 2 sprays into both nostrils daily.     glucose blood test strip Use as instructed 100 each 6   hydrochlorothiazide (HYDRODIURIL) 25 MG tablet TAKE 1 TABLET DAILY 90 tablet 1   hydrocortisone valerate ointment (WEST-CORT) 0.2 % Appy bid to affected area prn 15 g 2   Lancets (FREESTYLE) lancets Use as instructed 100 each 12   lisinopril (ZESTRIL) 10 MG tablet Take 1 tablet (10 mg total) by mouth daily. 90 tablet 1   metFORMIN (GLUCOPHAGE) 1000 MG tablet Take 1 tablet (1,000 mg total) by mouth 2 (two) times daily with a meal. 180 tablet 1   mometasone (NASONEX) 50 MCG/ACT nasal spray Place 2 sprays into the nose daily.     pioglitazone (ACTOS) 45 MG tablet TAKE 1 TABLET DAILY 90 tablet 0   prednisoLONE acetate (PRED FORTE) 1 % ophthalmic suspension Place 1 drop into both eyes daily as needed (eye irritation).     MOMETASONE FUROATE NA Place into the nose 2 (two) times daily.     phenylephrine (4-WAY FAST ACTING) 1 % nasal spray Place 1 drop into both nostrils every 6 (six) hours as needed for congestion. (Patient not taking: Reported on 01/09/2022)     No facility-administered medications prior to visit.    No Known Allergies  ROS As per HPI  PE:    01/09/2022    8:13 AM 10/08/2021    8:54 AM 09/23/2021    9:03 AM  Vitals with BMI  Height '6\' 1"'$  '6\' 1"'$  '6\' 1"'$   Weight 255 lbs 251 lbs 6 oz 251 lbs  BMI 33.65 34.74 25.95  Systolic 638 756 433  Diastolic 70 82 70  Pulse 81 85 104     Physical Exam  Gen: Alert, well appearing.  Patient is oriented to person, place, time, and situation. AFFECT: pleasant, lucid thought and speech. CV: RRR, no m/r/g.   LUNGS: CTA bilat, nonlabored resps, good aeration in all lung fields. EXT: no clubbing or cyanosis.  1+RLL pitting edema and no L leg pitting edema.     LABS:  Last CBC Lab Results  Component Value Date   WBC 5.9 06/26/2021   HGB 10.8 (L) 06/26/2021   HCT 34.8 (L) 06/26/2021   MCV 62.2 Repeated and verified X2. (L) 06/26/2021   MCH 19.3 (L) 03/07/2020   RDW 16.4 (H) 06/26/2021   PLT 241.0 06/26/2021   Lab Results  Component Value Date   IRON 187 (H) 07/09/2016   FERRITIN 260.1 29/51/8841   Last metabolic panel Lab Results  Component Value  Date   GLUCOSE 164 (H) 10/08/2021   NA 140 10/08/2021   K 4.0 10/08/2021   CL 107 10/08/2021   CO2 23 10/08/2021   BUN 16 10/08/2021   CREATININE 1.10 10/08/2021   GFRNONAA 59 09/05/2020   CALCIUM 8.9 10/08/2021   PROT 6.6 10/08/2021   ALBUMIN 4.1 10/08/2021   BILITOT 1.0 10/08/2021   ALKPHOS 62 10/08/2021   AST 12 10/08/2021   ALT 13 10/08/2021   ANIONGAP 13 01/24/2020   Last lipids Lab Results  Component Value Date   CHOL 124 10/08/2021   HDL 44.00 10/08/2021   LDLCALC 58 10/08/2021   TRIG 112.0 10/08/2021   CHOLHDL 3 10/08/2021   Last hemoglobin A1c Lab Results  Component Value Date   HGBA1C 6.7 (A) 10/08/2021   HGBA1C 6.7 10/08/2021   HGBA1C 6.7 (A) 10/08/2021   HGBA1C 6.7 10/08/2021   Last thyroid functions Lab Results  Component Value Date   TSH 0.84 07/24/2017   IMPRESSION AND PLAN:  #1 diabetes without complication.  Great control. POC hba1c today is 6.9%. Continue pioglitazone 45 mg a day and metformin 1000 mg twice a day.  2.  Hypertension, well controlled on amlodipine 10 mg a day hydrochlorothiazide 25 mg a day and lisinopril 10 mg a day.  #3 chronic renal insufficiency stage III. Avoid NSAIDs. Renal function checked today.  #4 gout.  Occasional episode in left foot. Most recent flare resolved with prednisone 20 mg a day for 3 days. I refilled his prednisone to use as needed.  If not improving with this after 2 to 3 days of use then he is to come in for evaluation.  #5 chronic nasal congestion. He is pleased with his latest ENT  evaluation. There is still plan for surgery at some point.  An After Visit Summary was printed and given to the patient.  FOLLOW UP: No follow-ups on file. Next CPE 06/2022  Signed:  Crissie Sickles, MD           01/09/2022

## 2022-01-09 NOTE — Telephone Encounter (Signed)
Patient was seen today by Dr. Anitra Lauth. Patient has all medications filled at mail order pharmacy.  [facility] - which [facility] not [facility].  Please delete [facility], and [facility] as preferred pharmacies.  [facility] and have them return medication to stock (patient rec'd message it was ready for pick up), send script to [facility].

## 2022-01-09 NOTE — Telephone Encounter (Signed)
Rx cancelled and resent

## 2022-01-10 ENCOUNTER — Other Ambulatory Visit: Payer: Self-pay

## 2022-01-10 DIAGNOSIS — R7989 Other specified abnormal findings of blood chemistry: Secondary | ICD-10-CM

## 2022-01-10 LAB — BASIC METABOLIC PANEL
BUN/Creatinine Ratio: 17 (calc) (ref 6–22)
BUN: 26 mg/dL — ABNORMAL HIGH (ref 7–25)
CO2: 24 mmol/L (ref 20–32)
Calcium: 9.3 mg/dL (ref 8.6–10.3)
Chloride: 106 mmol/L (ref 98–110)
Creat: 1.51 mg/dL — ABNORMAL HIGH (ref 0.70–1.28)
Glucose, Bld: 144 mg/dL — ABNORMAL HIGH (ref 65–99)
Potassium: 4.6 mmol/L (ref 3.5–5.3)
Sodium: 140 mmol/L (ref 135–146)

## 2022-01-16 DIAGNOSIS — E559 Vitamin D deficiency, unspecified: Secondary | ICD-10-CM | POA: Diagnosis not present

## 2022-01-23 ENCOUNTER — Other Ambulatory Visit (INDEPENDENT_AMBULATORY_CARE_PROVIDER_SITE_OTHER): Payer: Medicare HMO

## 2022-01-23 DIAGNOSIS — R7989 Other specified abnormal findings of blood chemistry: Secondary | ICD-10-CM | POA: Diagnosis not present

## 2022-01-23 LAB — BASIC METABOLIC PANEL
BUN: 17 mg/dL (ref 6–23)
CO2: 27 mEq/L (ref 19–32)
Calcium: 8.9 mg/dL (ref 8.4–10.5)
Chloride: 103 mEq/L (ref 96–112)
Creatinine, Ser: 1.21 mg/dL (ref 0.40–1.50)
GFR: 59.32 mL/min — ABNORMAL LOW (ref >60.00)
Glucose, Bld: 159 mg/dL — ABNORMAL HIGH (ref 70–99)
Potassium: 4.2 mEq/L (ref 3.5–5.1)
Sodium: 139 mEq/L (ref 135–145)

## 2022-02-12 DIAGNOSIS — Z9889 Other specified postprocedural states: Secondary | ICD-10-CM | POA: Diagnosis not present

## 2022-02-12 DIAGNOSIS — J310 Chronic rhinitis: Secondary | ICD-10-CM | POA: Diagnosis not present

## 2022-02-12 DIAGNOSIS — J342 Deviated nasal septum: Secondary | ICD-10-CM | POA: Diagnosis not present

## 2022-02-12 DIAGNOSIS — J3489 Other specified disorders of nose and nasal sinuses: Secondary | ICD-10-CM | POA: Diagnosis not present

## 2022-03-20 DIAGNOSIS — J320 Chronic maxillary sinusitis: Secondary | ICD-10-CM | POA: Diagnosis not present

## 2022-03-20 DIAGNOSIS — J342 Deviated nasal septum: Secondary | ICD-10-CM | POA: Diagnosis not present

## 2022-03-20 DIAGNOSIS — J329 Chronic sinusitis, unspecified: Secondary | ICD-10-CM | POA: Diagnosis not present

## 2022-03-20 DIAGNOSIS — J343 Hypertrophy of nasal turbinates: Secondary | ICD-10-CM | POA: Diagnosis not present

## 2022-03-20 DIAGNOSIS — J3489 Other specified disorders of nose and nasal sinuses: Secondary | ICD-10-CM | POA: Diagnosis not present

## 2022-03-20 HISTORY — PX: NASAL SINUS SURGERY: SHX719

## 2022-04-02 DIAGNOSIS — Z9889 Other specified postprocedural states: Secondary | ICD-10-CM | POA: Diagnosis not present

## 2022-04-14 ENCOUNTER — Ambulatory Visit (INDEPENDENT_AMBULATORY_CARE_PROVIDER_SITE_OTHER): Payer: Medicare HMO | Admitting: Family Medicine

## 2022-04-14 ENCOUNTER — Encounter: Payer: Self-pay | Admitting: Family Medicine

## 2022-04-14 VITALS — BP 134/73 | HR 75 | Temp 97.5°F | Ht 73.0 in | Wt 254.2 lb

## 2022-04-14 DIAGNOSIS — I10 Essential (primary) hypertension: Secondary | ICD-10-CM | POA: Diagnosis not present

## 2022-04-14 DIAGNOSIS — N1831 Chronic kidney disease, stage 3a: Secondary | ICD-10-CM

## 2022-04-14 DIAGNOSIS — E1122 Type 2 diabetes mellitus with diabetic chronic kidney disease: Secondary | ICD-10-CM

## 2022-04-14 DIAGNOSIS — E119 Type 2 diabetes mellitus without complications: Secondary | ICD-10-CM

## 2022-04-14 LAB — POCT GLYCOSYLATED HEMOGLOBIN (HGB A1C)
HbA1c POC (<> result, manual entry): 6.7 % (ref 4.0–5.6)
HbA1c, POC (controlled diabetic range): 6.7 % — AB (ref 0.0–7.0)
HbA1c, POC (prediabetic range): 6.7 % — AB (ref 5.7–6.4)
Hemoglobin A1C: 6.7 % — AB (ref 4.0–5.6)

## 2022-04-14 MED ORDER — LISINOPRIL 10 MG PO TABS: 10.0000 mg | ORAL_TABLET | Freq: Every day | ORAL | 1 refills | Status: DC

## 2022-04-14 MED ORDER — HYDROCHLOROTHIAZIDE 25 MG PO TABS: 25.0000 mg | ORAL_TABLET | Freq: Every day | ORAL | 1 refills | Status: DC

## 2022-04-14 NOTE — Progress Notes (Signed)
OFFICE VISIT  04/14/2022  CC:  Chief Complaint  Patient presents with   Medical Management of Chronic Issues    Pt is fasting    Patient is a 72 y.o. male who presents for 87-monthfollow-up diabetes, hypertension, and chronic renal insufficiency stage III. A/P as of last visit: "1 diabetes without complication.  Great control. POC hba1c today is 6.9%. Continue pioglitazone 45 mg a day and metformin 1000 mg twice a day.   2.  Hypertension, well controlled on amlodipine 10 mg a day hydrochlorothiazide 25 mg a day and lisinopril 10 mg a day.   #3 chronic renal insufficiency stage III. Avoid NSAIDs. Renal function checked today.   #4 gout.  Occasional episode in left foot. Most recent flare resolved with prednisone 20 mg a day for 3 days. I refilled his prednisone to use as needed.  If not improving with this after 2 to 3 days of use then he is to come in for evaluation.   #5 chronic nasal congestion. He is pleased with his latest ENT evaluation. There is still plan for surgery at some point."  INTERIM HX: FMdgot sinus surgery since I last saw him. Nasal/sinus symptoms are gradually improving.  Gets some periods of feeling cold sensation, random, he gets a blanket on and feels like it resolves over about an hour.  He checks his temperature and it is normal.  No rigors.  No malaise, no palpitations, no dizziness.  Has been working more on low carbohydrate diet.  ROS as above, plus--> no fevers, no CP, no SOB, no wheezing, no cough, no dizziness, no HAs, no rashes, no melena/hematochezia.  No polyuria or polydipsia. No focal weakness, paresthesias, or tremors.  No acute vision or hearing abnormalities.  No dysuria or unusual/new urinary urgency or frequency.  No recent changes in lower legs. No n/v/d or abd pain.  No palpitations.    Past Medical History:  Diagnosis Date   Carpal tunnel syndrome on left    Cataract 07/2015   R eye--surgery recommended by his eye MD 08/03/15    Chronic fatigue    [place]   Cr 1.2-1.4, GFR 50s-60s   Closed fracture of greater tuberosity of right humerus 02/28/2019   Immobil w/sling (Dr. LMardelle Matte   Colon cancer screening 02/15/2018   iFOB neg 02/2018,  06/2020, and 09/2021.   Erectile dysfunction    Dr. [name]  [facility]   Gout of big toe 1990s   only one episode; he cut back on peanut butter and hasn't had any prob since   Herpes simplex ophthalmicus    L eye, has opthalmologist, uses prednisone drops prn   History of gallstones    per pt report   History of kidney stones    History of prostatitis 1980s   Remote hx of seeing Dr[facility]ance [facility]--then Dr. [name]    Hyperlipidemia    Hypertension dx'd 08/2013   Microcytic anemia 06/2016   Nodular basal cell carcinoma (BCC) 02/19/2017   right temple-[id number]. Dr. TDenna Haggard  Shingles    recurrent L thorax   Sinus disease    Thalassemia    Mild anemia with MCV 60s (alpha thal minor or beta thal trait: Hb electrophoresis not done/no result available.).   Type 2 diabetes mellitus [place]albuminuria [place] Dx'd 08/2013    Past Surgical History:  Procedure Laterality Date   CHOLECYSTECTOMY N/A 01/26/2020   Procedure: LAPAROSCOPIC CHOLECYSTECTOMY;  Surgeon: Coralie Keens, MD;  Location: [facility];  Service: General;  Laterality: N/A;   [place]  03/20/2022   WISDOM TOOTH EXTRACTION      Outpatient Medications Prior to Visit  Medication Sig Dispense Refill   acetaminophen (TYLENOL) 500 MG tablet Take 500-1,000 mg by mouth every 6 (six) hours as needed (pain.).     amLODipine (NORVASC) 10 MG tablet Take 1 tablet (10 mg total) by mouth daily. 90 tablet 0   atorvastatin (LIPITOR) 20 MG tablet Take 1 tablet (20 mg total) by mouth daily. 90 tablet 0   cetirizine (ZYRTEC) 10 MG tablet Take 10 mg by mouth daily.     cholestyramine (QUESTRAN) 4 g  packet Take 1 packet (4 g total) by mouth once a week. 21 each 3   fluticasone (FLONASE) 50 MCG/ACT nasal spray Place 2 sprays into both nostrils daily.     glucose blood test strip Use as instructed 100 each 6   hydrocortisone valerate ointment (WEST-CORT) 0.2 % Appy bid to affected area prn 15 g 2   Lancets (FREESTYLE) lancets Use as instructed 100 each 12   metFORMIN (GLUCOPHAGE) 1000 MG tablet Take 1 tablet (1,000 mg total) by mouth 2 (two) times daily with a meal. 180 tablet 1   mometasone (NASONEX) 50 MCG/ACT nasal spray Place 2 sprays into the nose daily.     pioglitazone (ACTOS) 45 MG tablet Take 1 tablet (45 mg total) by mouth daily. 90 tablet 0   prednisoLONE acetate (PRED FORTE) 1 % ophthalmic suspension Place 1 drop into both eyes daily as needed (eye irritation).     predniSONE (DELTASONE) 20 MG tablet 1 tab po qd prn gout 10 tablet 1   hydrochlorothiazide (HYDRODIURIL) 25 MG tablet TAKE 1 TABLET DAILY 90 tablet 1   lisinopril (ZESTRIL) 10 MG tablet Take 1 tablet (10 mg total) by mouth daily. 90 tablet 1   No facility-administered medications prior to visit.    No Known Allergies  Review of Systems As per HPI  PE:    04/14/2022    8:18 AM 01/09/2022    8:13 AM 10/08/2021    8:54 AM  Vitals with BMI  Height '6\' 1"'$  '6\' 1"'$  '6\' 1"'$   Weight 254 lbs 3 oz 255 lbs 251 lbs 6 oz  BMI 33.54 [id number]  Systolic Q000111Q Q000111Q 0000000  Diastolic 73 70 82  Pulse 75 81 85     Physical Exam  Gen: Alert, well appearing.  Patient is oriented to person, place, time, and situation. AFFECT: pleasant, lucid thought and speech. CV: RRR, 1/6 syst murmur, no diastolic murmur, no r/g.   LUNGS: CTA bilat, nonlabored resps, good aeration in all lung fields. EXT: no clubbing or cyanosis.  1+ bilat LL pitting edema.    LABS:  Last metabolic panel Lab Results  Component Value Date   GLUCOSE 159 (H) 01/23/2022   NA 139 01/23/2022   K 4.2 01/23/2022   CL 103 01/23/2022   CO2 27 01/23/2022   BUN 17  01/23/2022   CREATININE 1.21 01/23/2022   GFRNONAA 59 09/05/2020   CALCIUM 8.9 01/23/2022   PROT 6.6 10/08/2021   ALBUMIN 4.1 10/08/2021   BILITOT 1.0 10/08/2021   ALKPHOS 62 10/08/2021   AST 12 10/08/2021   ALT 13 10/08/2021   ANIONGAP 13 01/24/2020   Lab Results  Component Value Date   HGBA1C 6.7 (A) 04/14/2022   HGBA1C 6.7 04/14/2022  HGBA1C 6.7 (A) 04/14/2022   HGBA1C 6.7 (A) 04/14/2022   IMPRESSION AND PLAN:  #1 diabetes without complication. Excellent control on metformin 1000 mg twice a day and pioglitazone 45 mg a day. POC Hba1c today is 6.7%.  #2 hypertension, well-controlled on amlodipine 10 mg a day and HCTZ 25 mg a day and lisinopril 10 mg a day. Serum creatinine and electrolytes have been stable over the last year.  #3 chronic renal insufficiency stage III. Serum creatinine and electrolytes have been stable over the last year. Plan repeat these labs at CPE in 3 months.  An After Visit Summary was printed and given to the patient.  FOLLOW UP: Return in about 3 months (around 07/15/2022) for annual CPE (fasting).  Signed:  Crissie Sickles, MD           04/14/2022

## 2022-05-08 ENCOUNTER — Other Ambulatory Visit: Payer: Self-pay | Admitting: Family Medicine

## 2022-05-08 DIAGNOSIS — I10 Essential (primary) hypertension: Secondary | ICD-10-CM

## 2022-06-09 ENCOUNTER — Other Ambulatory Visit: Payer: Self-pay | Admitting: Family Medicine

## 2022-06-25 ENCOUNTER — Telehealth: Payer: Self-pay | Admitting: Family Medicine

## 2022-06-25 NOTE — Telephone Encounter (Signed)
Contacted [facility] to schedule their annual wellness visit. Appointment made for 07/09/2022.  Zachary Chandler [facility] TEAM Direct Dial [phone]

## 2022-07-07 ENCOUNTER — Other Ambulatory Visit: Payer: Self-pay

## 2022-07-07 ENCOUNTER — Emergency Department (HOSPITAL_COMMUNITY): Payer: Medicare HMO

## 2022-07-07 ENCOUNTER — Inpatient Hospital Stay (HOSPITAL_COMMUNITY)
Admission: EM | Admit: 2022-07-07 | Discharge: 2022-07-09 | DRG: 193 | Disposition: A | Discharge status: Home / Self Care | Payer: Medicare HMO | Attending: Family Medicine | Admitting: Family Medicine

## 2022-07-07 ENCOUNTER — Other Ambulatory Visit: Payer: Self-pay | Admitting: Family Medicine

## 2022-07-07 ENCOUNTER — Encounter (HOSPITAL_COMMUNITY): Payer: Self-pay | Admitting: Emergency Medicine

## 2022-07-07 DIAGNOSIS — Z87891 Personal history of nicotine dependence: Secondary | ICD-10-CM

## 2022-07-07 DIAGNOSIS — R5381 Other malaise: Secondary | ICD-10-CM | POA: Diagnosis not present

## 2022-07-07 DIAGNOSIS — J189 Pneumonia, unspecified organism: Secondary | ICD-10-CM | POA: Diagnosis not present

## 2022-07-07 DIAGNOSIS — D569 Thalassemia, unspecified: Secondary | ICD-10-CM | POA: Diagnosis present

## 2022-07-07 DIAGNOSIS — Z7984 Long term (current) use of oral hypoglycemic drugs: Secondary | ICD-10-CM

## 2022-07-07 DIAGNOSIS — N179 Acute kidney failure, unspecified: Secondary | ICD-10-CM | POA: Diagnosis present

## 2022-07-07 DIAGNOSIS — E785 Hyperlipidemia, unspecified: Secondary | ICD-10-CM | POA: Diagnosis present

## 2022-07-07 DIAGNOSIS — N183 Chronic kidney disease, stage 3 unspecified: Secondary | ICD-10-CM | POA: Diagnosis present

## 2022-07-07 DIAGNOSIS — R059 Cough, unspecified: Secondary | ICD-10-CM | POA: Diagnosis not present

## 2022-07-07 DIAGNOSIS — E118 Type 2 diabetes mellitus with unspecified complications: Secondary | ICD-10-CM

## 2022-07-07 DIAGNOSIS — E872 Acidosis, unspecified: Secondary | ICD-10-CM

## 2022-07-07 DIAGNOSIS — Z85828 Personal history of other malignant neoplasm of skin: Secondary | ICD-10-CM

## 2022-07-07 DIAGNOSIS — Z79899 Other long term (current) drug therapy: Secondary | ICD-10-CM

## 2022-07-07 DIAGNOSIS — Z8249 Family history of ischemic heart disease and other diseases of the circulatory system: Secondary | ICD-10-CM

## 2022-07-07 DIAGNOSIS — I129 Hypertensive chronic kidney disease with stage 1 through stage 4 chronic kidney disease, or unspecified chronic kidney disease: Secondary | ICD-10-CM | POA: Diagnosis present

## 2022-07-07 DIAGNOSIS — E860 Dehydration: Secondary | ICD-10-CM | POA: Diagnosis present

## 2022-07-07 DIAGNOSIS — J9811 Atelectasis: Secondary | ICD-10-CM | POA: Diagnosis not present

## 2022-07-07 DIAGNOSIS — J9601 Acute respiratory failure with hypoxia: Secondary | ICD-10-CM | POA: Diagnosis present

## 2022-07-07 DIAGNOSIS — R5382 Chronic fatigue, unspecified: Secondary | ICD-10-CM | POA: Diagnosis present

## 2022-07-07 DIAGNOSIS — I10 Essential (primary) hypertension: Secondary | ICD-10-CM

## 2022-07-07 DIAGNOSIS — Z1152 Encounter for screening for COVID-19: Secondary | ICD-10-CM

## 2022-07-07 DIAGNOSIS — Z87442 Personal history of urinary calculi: Secondary | ICD-10-CM

## 2022-07-07 DIAGNOSIS — Z9049 Acquired absence of other specified parts of digestive tract: Secondary | ICD-10-CM

## 2022-07-07 DIAGNOSIS — R0602 Shortness of breath: Secondary | ICD-10-CM | POA: Diagnosis not present

## 2022-07-07 DIAGNOSIS — R7989 Other specified abnormal findings of blood chemistry: Secondary | ICD-10-CM | POA: Insufficient documentation

## 2022-07-07 DIAGNOSIS — J9809 Other diseases of bronchus, not elsewhere classified: Secondary | ICD-10-CM | POA: Diagnosis not present

## 2022-07-07 DIAGNOSIS — Z833 Family history of diabetes mellitus: Secondary | ICD-10-CM

## 2022-07-07 DIAGNOSIS — E1122 Type 2 diabetes mellitus with diabetic chronic kidney disease: Secondary | ICD-10-CM | POA: Diagnosis present

## 2022-07-07 LAB — CBC
HCT: 32 % — ABNORMAL LOW (ref 39.0–52.0)
Hemoglobin: 9.8 g/dL — ABNORMAL LOW (ref 13.0–17.0)
MCH: 19.4 pg — ABNORMAL LOW (ref 26.0–34.0)
MCHC: 30.6 g/dL (ref 30.0–36.0)
MCV: 63.4 fL — ABNORMAL LOW (ref 80.0–100.0)
Platelets: 195 10*3/uL (ref 150–400)
RBC: 5.05 MIL/uL (ref 4.22–5.81)
RDW: 16.9 % — ABNORMAL HIGH (ref 11.5–15.5)
WBC: 6.3 10*3/uL (ref 4.0–10.5)
nRBC: 0 % (ref 0.0–0.2)

## 2022-07-07 LAB — COMPREHENSIVE METABOLIC PANEL
ALT: 20 U/L (ref 0–44)
AST: 36 U/L (ref 15–41)
Albumin: 4 g/dL (ref 3.5–5.0)
Alkaline Phosphatase: 66 U/L (ref 38–126)
Anion gap: 17 — ABNORMAL HIGH (ref 5–15)
BUN: 26 mg/dL — ABNORMAL HIGH (ref 8–23)
CO2: 16 mmol/L — ABNORMAL LOW (ref 22–32)
Calcium: 8.6 mg/dL — ABNORMAL LOW (ref 8.9–10.3)
Chloride: 107 mmol/L (ref 98–111)
Creatinine, Ser: 1.46 mg/dL — ABNORMAL HIGH (ref 0.61–1.24)
GFR, Estimated: 50 mL/min — ABNORMAL LOW (ref >60)
Glucose, Bld: 151 mg/dL — ABNORMAL HIGH (ref 70–99)
Potassium: 3.8 mmol/L (ref 3.5–5.1)
Sodium: 140 mmol/L (ref 135–145)
Total Bilirubin: 0.9 mg/dL (ref 0.3–1.2)
Total Protein: 7.3 g/dL (ref 6.5–8.1)

## 2022-07-07 LAB — CULTURE, BLOOD (ROUTINE X 2)

## 2022-07-07 LAB — MAGNESIUM: Magnesium: 1.5 mg/dL — ABNORMAL LOW (ref 1.7–2.4)

## 2022-07-07 LAB — TROPONIN I (HIGH SENSITIVITY)
Troponin I (High Sensitivity): 3 ng/L (ref <18)
Troponin I (High Sensitivity): 3 ng/L (ref <18)

## 2022-07-07 LAB — BRAIN NATRIURETIC PEPTIDE: B Natriuretic Peptide: 24 pg/mL (ref 0.0–100.0)

## 2022-07-07 LAB — LACTIC ACID, PLASMA: Lactic Acid, Venous: 2.9 mmol/L (ref 0.5–1.9)

## 2022-07-07 LAB — SARS CORONAVIRUS 2 BY RT PCR: SARS Coronavirus 2 by RT PCR: NEGATIVE

## 2022-07-07 MED ORDER — SODIUM CHLORIDE 0.9 % IV SOLN
1.0000 g | Freq: Once | INTRAVENOUS | Status: AC
  Administered 2022-07-08: 1 g via INTRAVENOUS
  Filled 2022-07-07: qty 10

## 2022-07-07 MED ORDER — ALBUTEROL SULFATE HFA 108 (90 BASE) MCG/ACT IN AERS: 2.0000 | INHALATION_SPRAY | RESPIRATORY_TRACT | Status: DC | PRN

## 2022-07-07 MED ORDER — SODIUM CHLORIDE 0.9 % IV SOLN
500.0000 mg | Freq: Once | INTRAVENOUS | Status: AC
  Administered 2022-07-08: 500 mg via INTRAVENOUS
  Filled 2022-07-07: qty 5

## 2022-07-07 MED ORDER — IPRATROPIUM-ALBUTEROL 0.5-2.5 (3) MG/3ML IN SOLN
3.0000 mL | Freq: Once | RESPIRATORY_TRACT | Status: AC
  Administered 2022-07-07: 3 mL via RESPIRATORY_TRACT
  Filled 2022-07-07: qty 3

## 2022-07-07 MED ORDER — IOHEXOL 300 MG/ML  SOLN
75.0000 mL | Freq: Once | INTRAMUSCULAR | Status: AC | PRN
  Administered 2022-07-07: 75 mL via INTRAVENOUS

## 2022-07-07 MED ORDER — SODIUM CHLORIDE 0.9 % IV BOLUS
500.0000 mL | Freq: Once | INTRAVENOUS | Status: AC
  Administered 2022-07-07: 500 mL via INTRAVENOUS

## 2022-07-07 MED ORDER — MAGNESIUM SULFATE 2 GM/50ML IV SOLN
2.0000 g | Freq: Once | INTRAVENOUS | Status: AC
  Administered 2022-07-07: 2 g via INTRAVENOUS
  Filled 2022-07-07: qty 50

## 2022-07-07 NOTE — ED Provider Notes (Signed)
[place] EMERGENCY DEPARTMENT AT [facility] Provider Note   CSN: [id number] Arrival date & time: 07/07/22  2139     History {Add pertinent medical, surgical, social history, OB history to HPI:1} Chief Complaint  Patient presents with   Shortness of Breath    Zachary Chandler is a 72 y.o. male.  He is here with a plaint of cough shortness of breath chills but have been going on for the last for 5 days.  He said it started like sinus pressure and nasal congestion but moved into his chest.  He has had a cough but it has not really been productive, feels like it is getting caught in his chest.  He coughs so hard he feels lightheaded like he is going to pass out.  No known hemoptysis.  [place] he has had a low-grade fever.  He took a couple of home COVID test that were negative.  Wife sick with general malaise  The history is provided by the patient.  Shortness of Breath Severity:  Moderate Onset quality:  Gradual Duration:  5 days Timing:  Constant Progression:  Worsening Chronicity:  New Relieved by:  Nothing Worsened by:  Activity and coughing Ineffective treatments:  Rest Associated symptoms: chest pain, cough, fever and sputum production   Associated symptoms: no abdominal pain, no hemoptysis and no vomiting   Risk factors: no tobacco use        Home Medications Prior to Admission medications   Medication Sig Start Date End Date Taking? Authorizing Provider  acetaminophen (TYLENOL) 500 MG tablet Take 500-1,000 mg by mouth every 6 (six) hours as needed (pain.).    [provider]  amLODipine (NORVASC) 10 MG tablet TAKE 1 TABLET DAILY 05/08/22   McGowen, Maryjean Morn, MD  atorvastatin (LIPITOR) 20 MG tablet TAKE 1 TABLET DAILY 06/09/22   McGowen, Maryjean Morn, MD  cetirizine (ZYRTEC) 10 MG tablet Take 10 mg by mouth daily.    [provider]  cholestyramine (QUESTRAN) 4 g packet Take 1 packet (4 g total) by mouth once a week. 06/27/21   McGowen, Maryjean Morn, MD   fluticasone (FLONASE) 50 MCG/ACT nasal spray Place 2 sprays into both nostrils daily. 11/04/21   [provider]  glucose blood test strip Use as instructed 02/07/14   McGowen, Maryjean Morn, MD  hydrochlorothiazide (HYDRODIURIL) 25 MG tablet Take 1 tablet (25 mg total) by mouth daily. 04/14/22   McGowen, Maryjean Morn, MD  hydrocortisone valerate ointment (WEST-CORT) 0.2 % Appy bid to affected area prn 06/27/21   McGowen, Maryjean Morn, MD  Lancets (FREESTYLE) lancets Use as instructed 09/21/13   McGowen, Maryjean Morn, MD  lisinopril (ZESTRIL) 10 MG tablet Take 1 tablet (10 mg total) by mouth daily. 04/14/22   McGowen, Maryjean Morn, MD  metFORMIN (GLUCOPHAGE) 1000 MG tablet Take 1 tablet (1,000 mg total) by mouth 2 (two) times daily with a meal. 01/09/22   McGowen, Maryjean Morn, MD  mometasone (NASONEX) 50 MCG/ACT nasal spray Place 2 sprays into the nose daily. 12/02/21   [provider]  pioglitazone (ACTOS) 45 MG tablet TAKE 1 TABLET DAILY 06/09/22   McGowen, Maryjean Morn, MD  prednisoLONE acetate (PRED FORTE) 1 % ophthalmic suspension Place 1 drop into both eyes daily as needed (eye irritation). 04/05/19   [provider]  predniSONE (DELTASONE) 20 MG tablet 1 tab po qd prn gout 01/09/22   McGowen, Maryjean Morn, MD      Allergies    Patient has no known  allergies.    Review of Systems   Review of Systems  Constitutional:  Positive for fever.  Respiratory:  Positive for cough, sputum production and shortness of breath. Negative for hemoptysis.   Cardiovascular:  Positive for chest pain.  Gastrointestinal:  Negative for abdominal pain, nausea and vomiting.    Physical Exam Updated Vital Signs BP 114/80 (BP Location: Right Arm)   Pulse (!) 122   Temp 98.1 F (36.7 C) (Oral)   Resp (!) 24   Ht 6\' 1"  (1.854 m)   Wt 115.2 kg   SpO2 94%   BMI 33.51 kg/m  Physical Exam Vitals and nursing note reviewed.  Constitutional:      General: He is not in acute distress.    Appearance: He is  well-developed.  HENT:     Head: Normocephalic and atraumatic.  Eyes:     Conjunctiva/sclera: Conjunctivae normal.  Cardiovascular:     Rate and Rhythm: Regular rhythm. Tachycardia present.     Heart sounds: No murmur heard. Pulmonary:     Effort: Pulmonary effort is normal. No respiratory distress.     Breath sounds: Normal breath sounds.  Abdominal:     Palpations: Abdomen is soft.     Tenderness: There is no abdominal tenderness.  Musculoskeletal:        General: No swelling.     Cervical back: Neck supple.     Right lower leg: No tenderness. No edema.     Left lower leg: No tenderness. No edema.  Skin:    General: Skin is warm and dry.     Capillary Refill: Capillary refill takes less than 2 seconds.  Neurological:     General: No focal deficit present.     Mental Status: He is alert.     ED Results / Procedures / Treatments   Labs (all labs ordered are listed, but only abnormal results are displayed) Labs Reviewed  CBC  COMPREHENSIVE METABOLIC PANEL  BRAIN NATRIURETIC PEPTIDE  MAGNESIUM  TROPONIN I (HIGH SENSITIVITY)    EKG EKG Interpretation  Date/Time:  Monday Jul 07 2022 21:55:19 EDT Ventricular Rate:  111 PR Interval:  200 QRS Duration: 76 QT Interval:  422 QTC Calculation: 573 R Axis:   249 Text Interpretation: *** Critical Test Result: Long QTc Sinus tachycardia Right superior axis deviation Low voltage QRS Inferior infarct (cited on or before 24-Jan-2020) Cannot rule out Anterior infarct (cited on or before 24-Jan-2020) Prolonged QT Abnormal ECG When compared with ECG of 24-Jan-2020 09:44, Questionable change in initial forces of Inferior leads Confirmed by Meridee Score [phone]) on 07/07/2022 10:22:02 PM  Radiology No results found.  Procedures Procedures  {Document cardiac monitor, telemetry assessment procedure when appropriate:1}  Medications Ordered in ED Medications  albuterol (VENTOLIN HFA) 108 (90 Base) MCG/ACT inhaler 2 puff (has no  administration in time range)    ED Course/ Medical Decision Making/ A&P   {   Click here for ABCD2, HEART and other calculatorsREFRESH Note before signing :1}                          Medical Decision Making Amount and/or Complexity of Data Reviewed Labs: ordered. Radiology: ordered.  Risk Prescription drug management.   This patient complains of ***; this involves an extensive number of treatment Options and is a complaint that carries with it a high risk of complications and morbidity. The differential includes ***  I ordered, reviewed and interpreted labs, which included ***  I ordered medication *** and reviewed PMP when indicated. I ordered imaging studies which included *** and I independently    visualized and interpreted imaging which showed *** Additional history obtained from *** Previous records obtained and reviewed *** I consulted *** and discussed lab and imaging findings and discussed disposition.  Cardiac monitoring reviewed, *** Social determinants considered, *** Critical Interventions: ***  After the interventions stated above, I reevaluated the patient and found *** Admission and further testing considered, ***   {Document critical care time when appropriate:1} {Document review of labs and clinical decision tools ie heart score, Chads2Vasc2 etc:1}  {Document your independent review of radiology images, and any outside records:1} {Document your discussion with family members, caretakers, and with consultants:1} {Document social determinants of health affecting pt's care:1} {Document your decision making why or why not admission, treatments were needed:1} Final Clinical Impression(s) / ED Diagnoses Final diagnoses:  None    Rx / DC Orders ED Discharge Orders     None

## 2022-07-07 NOTE — ED Triage Notes (Signed)
Pt reports SOB and "feeling bad" since last Wednesday with sinus congestion, cough, and general malaise. Today symptoms have worsened and pt feels like he can't breathe and is unable to tolerate activity without feeling SOB. Notably dyspneic in triage. Pt reports CP, fevers/chills, and sweaty spells, and dizziness. O2 sat 94% on room air.

## 2022-07-07 NOTE — ED Notes (Signed)
Noted critical result on pt EKG in triage; hard copy of EKG given to Dr. Meridee Score for review.

## 2022-07-07 NOTE — ED Provider Notes (Signed)
Care of the patient assumed at shift change. Here for SOB and cough, concern for PNA on CXR. Awaiting labs and CT to confirm. Given Abx. Cultures pending.  Physical Exam  BP (!) 124/56   Pulse 97   Temp 98.1 F (36.7 C) (Oral)   Resp 17   Ht 6\' 1"  (1.854 m)   Wt 115.2 kg   SpO2 94%   BMI 33.51 kg/m   Physical Exam  Procedures  Procedures  ED Course / MDM   Clinical Course as of 07/08/22 0337  Mon Jul 07, 2022  2245 Chest x-ray interpreted by me as possible infiltrate on right, awaiting radiology reading. [MB]  2333 CBC with normal WBC. Anemia is similar to previous.  [CS]  2345 Covid is neg. Lactic acid is mildly elevated.  [CS]  2357 Trop is normal.  [CS]  Tue Jul 08, 2022  0012 I personally viewed the images from radiology studies and agree with radiologist interpretation:  CT concerning for pneumonia as suspected. Patient with occasional drop in SpO2 to ~90% on room air at rest. Plan admission for further management.  [CS]  0109 Repeat lactic acid has gone up some, but still <4.0. Will give LR bolus and begin supplemental oxygen. Recheck after IVF.  [CS]  0124 Spoke with Dr. Carren Rang, Hospitalist, who will evaluate for admission.  [CS]    Clinical Course User Index [CS] Pollyann Savoy, MD [MB] Terrilee Files, MD   Medical Decision Making Problems Addressed: Community acquired pneumonia, unspecified laterality: acute illness or injury Lactic acidosis: acute illness or injury  Amount and/or Complexity of Data Reviewed Labs: ordered. Radiology: ordered.  Risk Prescription drug management. Decision regarding hospitalization.          Pollyann Savoy, MD 07/08/22 [phone]

## 2022-07-08 DIAGNOSIS — Z87891 Personal history of nicotine dependence: Secondary | ICD-10-CM | POA: Diagnosis not present

## 2022-07-08 DIAGNOSIS — E872 Acidosis, unspecified: Secondary | ICD-10-CM | POA: Diagnosis not present

## 2022-07-08 DIAGNOSIS — J189 Pneumonia, unspecified organism: Secondary | ICD-10-CM | POA: Diagnosis not present

## 2022-07-08 DIAGNOSIS — Z8249 Family history of ischemic heart disease and other diseases of the circulatory system: Secondary | ICD-10-CM | POA: Diagnosis not present

## 2022-07-08 DIAGNOSIS — I10 Essential (primary) hypertension: Secondary | ICD-10-CM

## 2022-07-08 DIAGNOSIS — D569 Thalassemia, unspecified: Secondary | ICD-10-CM | POA: Diagnosis not present

## 2022-07-08 DIAGNOSIS — E118 Type 2 diabetes mellitus with unspecified complications: Secondary | ICD-10-CM

## 2022-07-08 DIAGNOSIS — E785 Hyperlipidemia, unspecified: Secondary | ICD-10-CM | POA: Diagnosis not present

## 2022-07-08 DIAGNOSIS — Z9049 Acquired absence of other specified parts of digestive tract: Secondary | ICD-10-CM | POA: Diagnosis not present

## 2022-07-08 DIAGNOSIS — E782 Mixed hyperlipidemia: Secondary | ICD-10-CM

## 2022-07-08 DIAGNOSIS — N183 Chronic kidney disease, stage 3 unspecified: Secondary | ICD-10-CM | POA: Diagnosis not present

## 2022-07-08 DIAGNOSIS — Z7984 Long term (current) use of oral hypoglycemic drugs: Secondary | ICD-10-CM | POA: Diagnosis not present

## 2022-07-08 DIAGNOSIS — E1122 Type 2 diabetes mellitus with diabetic chronic kidney disease: Secondary | ICD-10-CM | POA: Diagnosis not present

## 2022-07-08 DIAGNOSIS — Z85828 Personal history of other malignant neoplasm of skin: Secondary | ICD-10-CM | POA: Diagnosis not present

## 2022-07-08 DIAGNOSIS — R7989 Other specified abnormal findings of blood chemistry: Secondary | ICD-10-CM

## 2022-07-08 DIAGNOSIS — Z79899 Other long term (current) drug therapy: Secondary | ICD-10-CM | POA: Diagnosis not present

## 2022-07-08 DIAGNOSIS — J9601 Acute respiratory failure with hypoxia: Secondary | ICD-10-CM | POA: Diagnosis not present

## 2022-07-08 DIAGNOSIS — Z1152 Encounter for screening for COVID-19: Secondary | ICD-10-CM | POA: Diagnosis not present

## 2022-07-08 DIAGNOSIS — Z87442 Personal history of urinary calculi: Secondary | ICD-10-CM | POA: Diagnosis not present

## 2022-07-08 DIAGNOSIS — R5382 Chronic fatigue, unspecified: Secondary | ICD-10-CM | POA: Diagnosis not present

## 2022-07-08 DIAGNOSIS — N179 Acute kidney failure, unspecified: Secondary | ICD-10-CM | POA: Diagnosis not present

## 2022-07-08 DIAGNOSIS — I129 Hypertensive chronic kidney disease with stage 1 through stage 4 chronic kidney disease, or unspecified chronic kidney disease: Secondary | ICD-10-CM | POA: Diagnosis not present

## 2022-07-08 DIAGNOSIS — E860 Dehydration: Secondary | ICD-10-CM | POA: Diagnosis not present

## 2022-07-08 DIAGNOSIS — Z833 Family history of diabetes mellitus: Secondary | ICD-10-CM | POA: Diagnosis not present

## 2022-07-08 LAB — COMPREHENSIVE METABOLIC PANEL
ALT: 18 U/L (ref 0–44)
AST: 38 U/L (ref 15–41)
Albumin: 3.4 g/dL — ABNORMAL LOW (ref 3.5–5.0)
Alkaline Phosphatase: 58 U/L (ref 38–126)
Anion gap: 15 (ref 5–15)
BUN: 25 mg/dL — ABNORMAL HIGH (ref 8–23)
CO2: 18 mmol/L — ABNORMAL LOW (ref 22–32)
Calcium: 8.1 mg/dL — ABNORMAL LOW (ref 8.9–10.3)
Chloride: 106 mmol/L (ref 98–111)
Creatinine, Ser: 1.55 mg/dL — ABNORMAL HIGH (ref 0.61–1.24)
GFR, Estimated: 47 mL/min — ABNORMAL LOW (ref >60)
Glucose, Bld: 177 mg/dL — ABNORMAL HIGH (ref 70–99)
Potassium: 4.1 mmol/L (ref 3.5–5.1)
Sodium: 139 mmol/L (ref 135–145)
Total Bilirubin: 0.9 mg/dL (ref 0.3–1.2)
Total Protein: 6.4 g/dL — ABNORMAL LOW (ref 6.5–8.1)

## 2022-07-08 LAB — GLUCOSE, CAPILLARY
Glucose-Capillary: 149 mg/dL — ABNORMAL HIGH (ref 70–99)
Glucose-Capillary: 154 mg/dL — ABNORMAL HIGH (ref 70–99)
Glucose-Capillary: 133 mg/dL — ABNORMAL HIGH (ref 70–99)
Glucose-Capillary: 153 mg/dL — ABNORMAL HIGH (ref 70–99)

## 2022-07-08 LAB — CBC WITH DIFFERENTIAL/PLATELET
Abs Immature Granulocytes: 0.04 10*3/uL (ref 0.00–0.07)
Basophils Absolute: 0 10*3/uL (ref 0.0–0.1)
Basophils Relative: 0 %
Eosinophils Absolute: 0 10*3/uL (ref 0.0–0.5)
Eosinophils Relative: 0 %
HCT: 29.3 % — ABNORMAL LOW (ref 39.0–52.0)
Hemoglobin: 9 g/dL — ABNORMAL LOW (ref 13.0–17.0)
Immature Granulocytes: 1 %
Lymphocytes Relative: 10 %
Lymphs Abs: 0.5 10*3/uL — ABNORMAL LOW (ref 0.7–4.0)
MCH: 19.3 pg — ABNORMAL LOW (ref 26.0–34.0)
MCHC: 30.7 g/dL (ref 30.0–36.0)
MCV: 62.9 fL — ABNORMAL LOW (ref 80.0–100.0)
Monocytes Absolute: 0.6 10*3/uL (ref 0.1–1.0)
Monocytes Relative: 12 %
Neutro Abs: 3.7 10*3/uL (ref 1.7–7.7)
Neutrophils Relative %: 77 %
Platelets: 188 10*3/uL (ref 150–400)
RBC: 4.66 MIL/uL (ref 4.22–5.81)
RDW: 16.7 % — ABNORMAL HIGH (ref 11.5–15.5)
WBC: 4.9 10*3/uL (ref 4.0–10.5)
nRBC: 0 % (ref 0.0–0.2)

## 2022-07-08 LAB — EXPECTORATED SPUTUM ASSESSMENT W GRAM STAIN, RFLX TO RESP C

## 2022-07-08 LAB — LACTIC ACID, PLASMA
Lactic Acid, Venous: 3.7 mmol/L (ref 0.5–1.9)
Lactic Acid, Venous: 3 mmol/L (ref 0.5–1.9)

## 2022-07-08 LAB — CULTURE, BLOOD (ROUTINE X 2)
Special Requests: ADEQUATE
Special Requests: ADEQUATE

## 2022-07-08 LAB — MAGNESIUM: Magnesium: 1.9 mg/dL (ref 1.7–2.4)

## 2022-07-08 MED ORDER — SODIUM CHLORIDE 0.9 % IV SOLN: INTRAVENOUS | Status: DC

## 2022-07-08 MED ORDER — ONDANSETRON HCL 4 MG PO TABS: 4.0000 mg | ORAL_TABLET | Freq: Four times a day (QID) | ORAL | Status: DC | PRN

## 2022-07-08 MED ORDER — LACTATED RINGERS IV BOLUS
1000.0000 mL | Freq: Once | INTRAVENOUS | Status: AC
  Administered 2022-07-08: 1000 mL via INTRAVENOUS

## 2022-07-08 MED ORDER — HEPARIN SODIUM (PORCINE) 5000 UNIT/ML IJ SOLN
5000.0000 [IU] | Freq: Three times a day (TID) | INTRAMUSCULAR | Status: DC
  Administered 2022-07-08 – 2022-07-09 (×4): 5000 [IU] via SUBCUTANEOUS
  Filled 2022-07-08 (×4): qty 1

## 2022-07-08 MED ORDER — ONDANSETRON HCL 4 MG/2ML IJ SOLN: 4.0000 mg | Freq: Four times a day (QID) | INTRAMUSCULAR | Status: DC | PRN

## 2022-07-08 MED ORDER — INSULIN ASPART 100 UNIT/ML IJ SOLN: 0.0000 [IU] | Freq: Every day | INTRAMUSCULAR | Status: DC

## 2022-07-08 MED ORDER — ALBUTEROL SULFATE (2.5 MG/3ML) 0.083% IN NEBU
INHALATION_SOLUTION | RESPIRATORY_TRACT | Status: AC
  Filled 2022-07-08: qty 3

## 2022-07-08 MED ORDER — SODIUM CHLORIDE 0.9 % IV SOLN
2.0000 g | INTRAVENOUS | Status: DC
  Administered 2022-07-08: 2 g via INTRAVENOUS
  Filled 2022-07-08: qty 20

## 2022-07-08 MED ORDER — FLUTICASONE PROPIONATE 50 MCG/ACT NA SUSP
1.0000 | Freq: Every day | NASAL | Status: DC
  Administered 2022-07-08 – 2022-07-09 (×2): 1 via NASAL
  Filled 2022-07-08: qty 16

## 2022-07-08 MED ORDER — CHOLESTYRAMINE LIGHT 4 G PO PACK
4.0000 g | PACK | ORAL | Status: DC
  Filled 2022-07-08: qty 1

## 2022-07-08 MED ORDER — MORPHINE SULFATE (PF) 2 MG/ML IV SOLN: 2.0000 mg | INTRAVENOUS | Status: DC | PRN

## 2022-07-08 MED ORDER — DM-GUAIFENESIN ER 30-600 MG PO TB12
1.0000 | ORAL_TABLET | Freq: Two times a day (BID) | ORAL | Status: DC
  Administered 2022-07-08 – 2022-07-09 (×3): 1 via ORAL
  Filled 2022-07-08 (×3): qty 1

## 2022-07-08 MED ORDER — ACETAMINOPHEN 650 MG RE SUPP: 650.0000 mg | Freq: Four times a day (QID) | RECTAL | Status: DC | PRN

## 2022-07-08 MED ORDER — ACETAMINOPHEN 325 MG PO TABS
650.0000 mg | ORAL_TABLET | Freq: Four times a day (QID) | ORAL | Status: DC | PRN
  Administered 2022-07-08 (×4): 650 mg via ORAL
  Filled 2022-07-08 (×4): qty 2

## 2022-07-08 MED ORDER — AMLODIPINE BESYLATE 5 MG PO TABS
10.0000 mg | ORAL_TABLET | Freq: Every day | ORAL | Status: DC
  Administered 2022-07-08 – 2022-07-09 (×2): 10 mg via ORAL
  Filled 2022-07-08 (×2): qty 2

## 2022-07-08 MED ORDER — SODIUM CHLORIDE 0.9 % IV SOLN
500.0000 mg | INTRAVENOUS | Status: DC
  Administered 2022-07-09: 500 mg via INTRAVENOUS
  Filled 2022-07-08: qty 5

## 2022-07-08 MED ORDER — INSULIN ASPART 100 UNIT/ML IJ SOLN: 0.0000 [IU] | Freq: Three times a day (TID) | INTRAMUSCULAR | Status: DC

## 2022-07-08 MED ORDER — BUDESONIDE 0.5 MG/2ML IN SUSP
0.5000 mg | Freq: Two times a day (BID) | RESPIRATORY_TRACT | Status: DC
  Administered 2022-07-08 – 2022-07-09 (×2): 0.5 mg via RESPIRATORY_TRACT
  Filled 2022-07-08 (×2): qty 2

## 2022-07-08 MED ORDER — ALBUTEROL SULFATE (2.5 MG/3ML) 0.083% IN NEBU
2.5000 mg | INHALATION_SOLUTION | RESPIRATORY_TRACT | Status: DC | PRN
  Administered 2022-07-08 – 2022-07-09 (×4): 2.5 mg via RESPIRATORY_TRACT
  Filled 2022-07-08 (×3): qty 3

## 2022-07-08 MED ORDER — LORATADINE 10 MG PO TABS
10.0000 mg | ORAL_TABLET | Freq: Every day | ORAL | Status: DC
  Administered 2022-07-08 – 2022-07-09 (×2): 10 mg via ORAL
  Filled 2022-07-08 (×2): qty 1

## 2022-07-08 MED ORDER — OXYCODONE HCL 5 MG PO TABS: 5.0000 mg | ORAL_TABLET | ORAL | Status: DC | PRN

## 2022-07-08 MED ORDER — ATORVASTATIN CALCIUM 20 MG PO TABS
20.0000 mg | ORAL_TABLET | Freq: Every day | ORAL | Status: DC
  Administered 2022-07-08 – 2022-07-09 (×2): 20 mg via ORAL
  Filled 2022-07-08 (×2): qty 1

## 2022-07-08 NOTE — Assessment & Plan Note (Signed)
Continue statin. 

## 2022-07-08 NOTE — Assessment & Plan Note (Signed)
Continue Norvasc

## 2022-07-08 NOTE — H&P (Signed)
History and Physical    Patient: Zachary Chandler [id number] DOB: 1950/03/13 DOA: 07/07/2022 DOS: the patient was seen and examined on 07/08/2022 PCP: Jeoffrey Massed, MD  Patient coming from: Home  Chief Complaint:  Chief Complaint  Patient presents with   Shortness of Breath   HPI: EON BRAKE is a 72 y.o. male with medical history significant of CKD stage III, hypertension, hyperlipidemia, diabetes mellitus type 2, and more presents the ED with chief complaint of shortness of breath.  Patient reports that he had URI symptoms that started 5 days ago.  At first he thought it was seasonal allergies, but it continued to get worse.  He has had a cough it has been nonproductive.  He has had a subjective fever.  He felt clammy on the morning of the day of presentation.  He reports that his shortness of breath is better with exertion.  He has had chest tightness in the upper part of his chest.  He does not wear oxygen at home.  Since having albuterol and oxygen in the ED he is feeling better.  He had some nausea but no vomiting.  He thinks the nausea is associated with coughing spells.  He has not had any abdominal pain or dysuria.  He reports no change in his normal loose bowels secondary to cholecystectomy.  Patient has no other complaints at this time.  Patient does not smoke, does not drink, does not use illicit drugs.  He is vaccinated for COVID.  Patient is full code. Review of Systems: As mentioned in the history of present illness. All other systems reviewed and are negative. Past Medical History:  Diagnosis Date   Carpal tunnel syndrome on left    Cataract 07/2015   R eye--surgery recommended by his eye MD 08/03/15   Chronic fatigue    Chronic renal insufficiency, stage 3 (moderate) (HCC) 2021   Cr 1.2-1.4, GFR 50s-60s   Closed fracture of greater tuberosity of right humerus 02/28/2019   Immobil w/sling (Dr. Dion Saucier)   Colon cancer screening 02/15/2018   iFOB neg 02/2018,   06/2020, and 09/2021.   Erectile dysfunction    Dr. Patsi Sears.  Testosterone normal 04/2016 at [facility].   Gout of big toe 1990s   only one episode; he cut back on peanut butter and hasn't had any prob since   Herpes simplex ophthalmicus    L eye, has opthalmologist, uses prednisone drops prn   History of gallstones    per pt report   History of kidney stones    History of prostatitis 1980s   Remote hx of seeing Dr. Vonita Moss at [facility]--then Dr. Patsi Sears.    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 Dr. Jorja Loa   Shingles    recurrent L thorax   Sinus disease    Thalassemia    Mild anemia with MCV 60s (alpha thal minor or beta thal trait: Hb electrophoresis not done/no result available.).   Type 2 diabetes mellitus with microalbuminuria (HCC) Dx'd 08/2013   Past Surgical History:  Procedure Laterality Date   CHOLECYSTECTOMY N/A 01/26/2020   Procedure: LAPAROSCOPIC CHOLECYSTECTOMY;  Surgeon: Abigail Miyamoto, MD;  Location: [facility];  Service: General;  Laterality: N/A;   NASAL POLYP EXCISION  1960   NASAL SINUS SURGERY  03/20/2022   WISDOM TOOTH EXTRACTION     Social History:  reports that he quit smoking about 52  years ago. His smoking use included cigarettes. He has never used smokeless tobacco. He reports that he does not drink alcohol and does not use drugs.  No Known Allergies  Family History  Problem Relation Age of Onset   Heart disease Mother    Hypertension Mother    Diabetes Mother    Heart disease Father    Heart attack Sister     Prior to Admission medications   Medication Sig Start Date End Date Taking? Authorizing Provider  acetaminophen (TYLENOL) 500 MG tablet Take 500-1,000 mg by mouth every 6 (six) hours as needed (pain.).    [provider]  amLODipine (NORVASC) 10 MG tablet TAKE 1 TABLET DAILY 05/08/22   McGowen, Maryjean Morn, MD  atorvastatin  (LIPITOR) 20 MG tablet TAKE 1 TABLET DAILY 06/09/22   McGowen, Maryjean Morn, MD  cetirizine (ZYRTEC) 10 MG tablet Take 10 mg by mouth daily.    [provider]  cholestyramine (QUESTRAN) 4 g packet Take 1 packet (4 g total) by mouth once a week. 06/27/21   McGowen, Maryjean Morn, MD  fluticasone (FLONASE) 50 MCG/ACT nasal spray Place 2 sprays into both nostrils daily. 11/04/21   [provider]  glucose blood test strip Use as instructed 02/07/14   McGowen, Maryjean Morn, MD  hydrochlorothiazide (HYDRODIURIL) 25 MG tablet Take 1 tablet (25 mg total) by mouth daily. 04/14/22   McGowen, Maryjean Morn, MD  hydrocortisone valerate ointment (WEST-CORT) 0.2 % Appy bid to affected area prn 06/27/21   McGowen, Maryjean Morn, MD  Lancets (FREESTYLE) lancets Use as instructed 09/21/13   McGowen, Maryjean Morn, MD  lisinopril (ZESTRIL) 10 MG tablet Take 1 tablet (10 mg total) by mouth daily. 04/14/22   McGowen, Maryjean Morn, MD  metFORMIN (GLUCOPHAGE) 1000 MG tablet Take 1 tablet (1,000 mg total) by mouth 2 (two) times daily with a meal. 01/09/22   McGowen, Maryjean Morn, MD  mometasone (NASONEX) 50 MCG/ACT nasal spray Place 2 sprays into the nose daily. 12/02/21   [provider]  pioglitazone (ACTOS) 45 MG tablet TAKE 1 TABLET DAILY 06/09/22   McGowen, Maryjean Morn, MD  prednisoLONE acetate (PRED FORTE) 1 % ophthalmic suspension Place 1 drop into both eyes daily as needed (eye irritation). 04/05/19   [provider]  predniSONE (DELTASONE) 20 MG tablet 1 tab po qd prn gout 01/09/22   Jeoffrey Massed, MD    Physical Exam: Vitals:   07/07/22 2304 07/08/22 0145 07/08/22 0215 07/08/22 0243  BP:   (!) 159/78   Pulse:  (!) 108 (!) 122   Resp:   20   Temp:   98.9 F (37.2 C)   TempSrc:   Oral   SpO2: 94% 95% 99% 95%  Weight:      Height:       1.  General: Patient lying supine in bed,  no acute distress   2. Psychiatric: Alert and oriented x 3, mood and behavior normal for situation, pleasant and cooperative with  exam   3. Neurologic: Speech and language are normal, face is symmetric, moves all 4 extremities voluntarily, at baseline without acute deficits on limited exam   4. HEENMT:  Head is atraumatic, normocephalic, pupils reactive to light, neck is supple, trachea is midline, mucous membranes are moist   5. Respiratory : Lungs are clear to auscultation bilaterally without wheezing, rhonchi, rales, no cyanosis, no increase in work of breathing or accessory muscle use   6. Cardiovascular : Heart rate normal, rhythm is regular,  no murmurs, rubs or gallops, no peripheral edema, peripheral pulses palpated   7. Gastrointestinal:  Abdomen is soft, nondistended, nontender to palpation bowel sounds active, no masses or organomegaly palpated   8. Skin:  Skin is warm, dry and intact without rashes, acute lesions, or ulcers on limited exam   9.Musculoskeletal:  No acute deformities or trauma, no asymmetry in tone, no peripheral edema, peripheral pulses palpated, no tenderness to palpation in the extremities  Data Reviewed: In the ED Temp 98.1, heart rate 97-1 22, respiratory rate 17-24, blood pressure 114/56-124/80, satting 90-94% No leukocytosis with a white blood cell count of 6.3, hemoglobin 9.8 Chemistry reveals a decreased bicarb at 16 and a bump in creatinine at 1.46 Lactic acidosis that initially was 2.9 and then increased to 3.7, repeat pending Blood cultures pending COVID-negative Chest x-ray is showing an ill-defined nodular infiltrate within the right lung so CT was recommended which showed right-sided upper and lower pneumonia Patient was started on Rocephin, Zithromax, given a DuoNeb and an albuterol, given a 1.5 L bolus and started on mag in the ED Admission requested for CAP  Assessment and Plan: * Pneumonia - CT scan shows right-sided pneumonia - Patient was started on Rocephin and Zithromax in the ED - Continue Rocephin and Zithromax - Sputum culture pending - Strep and  Legionella urine antigens pending - Blood cultures pending - Continue albuterol as needed - Continue to monitor  Lactic acidosis Secondary to borderline hypoxia and dehydration - 1.5 L given in the ED - Trend lactic acid  Elevated serum creatinine - Creatinine up to 1.46 from 1.2 - Hold nephrotoxic agents when possible - 1.5 L given in the ED - Trend in the a.m.  Hyperlipidemia - Continue statin  Diabetes mellitus with complication (HCC) - Hold metformin and Actos - Sliding scale coverage - Continue to monitor  HTN (hypertension), benign - Continue Norvasc      Advance Care Planning:   Code Status: Full Code  Consults: None at this time  Family Communication: Wife at bedside  Severity of Illness: The appropriate patient status for this patient is INPATIENT. Inpatient status is judged to be reasonable and necessary in order to provide the required intensity of service to ensure the patient's safety. The patient's presenting symptoms, physical exam findings, and initial radiographic and laboratory data in the context of their chronic comorbidities is felt to place them at high risk for further clinical deterioration. Furthermore, it is not anticipated that the patient will be medically stable for discharge from the hospital within 2 midnights of admission.   * I certify that at the point of admission it is my clinical judgment that the patient will require inpatient hospital care spanning beyond 2 midnights from the point of admission due to high intensity of service, high risk for further deterioration and high frequency of surveillance required.*  Author: Lilyan Gilford, DO 07/08/2022 3:58 AM  For on call review [email or web address].

## 2022-07-08 NOTE — Progress Notes (Signed)
   07/08/22 1346  Assess: MEWS Score  Temp 98.8 F (37.1 C)  BP (!) 137/43  MAP (mmHg) 67  Pulse Rate 78  Resp (!) 25  SpO2 98 %  O2 Device Nasal Cannula  O2 Flow Rate (L/min) 2 L/min  Assess: MEWS Score  MEWS Temp 0  MEWS Systolic 0  MEWS Pulse 0  MEWS RR 1  MEWS LOC 0  MEWS Score 1  MEWS Score Color Green  Assess: SIRS CRITERIA  SIRS Temperature  0  SIRS Pulse 0  SIRS Respirations  1  SIRS WBC 0  SIRS Score Sum  1

## 2022-07-08 NOTE — Telephone Encounter (Signed)
Pt has upcoming appt 6/4.

## 2022-07-08 NOTE — Progress Notes (Signed)
Patient refused to take insulin this morning, patient stated he takes metformin at home. Patient educated regarding the need to take insulin. MD Gwenlyn Perking made aware.

## 2022-07-08 NOTE — Progress Notes (Signed)
Shortness of breath, intermittent coughing spells and general malaise.  Workup has demonstrated right lung pneumonia lactic acidosis.  Patient with underlying history of diabetes.  Currently afebrile and receiving 2-3 L nasal cannula supplementation to maintain saturation.  Low-grade temperature appreciated overnight; mildly tachycardic and tachypneic at time of admission.  Please refer to H&P written by Dr. Carren Rang for further info/details on admission.  Plan: -Continue current IV antibiotics, supportive care, bronchodilator management and mucolytic's -Flutter valve started -Continue adequate hydration and follow clinical response.  Vassie Loll MD [phone]

## 2022-07-08 NOTE — TOC CM/SW Note (Signed)
Transition of Care [facility]) - Inpatient Brief Assessment   Patient Details  Name: Zachary Chandler MRN: [id number] Date of Birth: 02-27-1950  Transition of Care [facility]) CM/SW Contact:    Elliot Gault, LCSW Phone Number: 07/08/2022, 10:03 AM   Clinical Narrative:  Transition of Care Department [facility]) has reviewed patient and no TOC needs have been identified at this time. We will continue to monitor patient advancement through interdisciplinary progression rounds. If new patient transition needs arise, please place a TOC consult.  Transition of Care Asessment: Insurance and Status: Insurance coverage has been reviewed Patient has primary care physician: Yes Home environment has been reviewed: from home with spouse Prior level of function:: independent Prior/Current Home Services: No current home services Social Determinants of Health Reivew: SDOH reviewed no interventions necessary Readmission risk has been reviewed: Yes Transition of care needs: no transition of care needs at this time

## 2022-07-08 NOTE — Assessment & Plan Note (Addendum)
-   CT scan shows right-sided pneumonia - Patient was started on Rocephin and Zithromax in the ED - Continue Rocephin and Zithromax - Sputum culture pending - Strep and Legionella urine antigens pending - Blood cultures pending - Continue albuterol as needed - Continue to monitor

## 2022-07-08 NOTE — Progress Notes (Signed)
   07/08/22 1048  Assess: MEWS Score  Temp 98.5 F (36.9 C)  BP (!) 146/59  MAP (mmHg) 83  Pulse Rate 85  Resp (!) 32 (Assigned RN AWARE)  SpO2 96 %  O2 Device Nasal Cannula  O2 Flow Rate (L/min) 1.8 L/min (2 liters)  Assess: MEWS Score  MEWS Temp 0  MEWS Systolic 0  MEWS Pulse 0  MEWS RR 2  MEWS LOC 0  MEWS Score 2  MEWS Score Color Yellow  Assess: if the MEWS score is Yellow or Red  Were vital signs taken at a resting state? Yes  Focused Assessment Change from prior assessment (see assessment flowsheet)  Does the patient meet 2 or more of the SIRS criteria? No  MEWS guidelines implemented  Yes, yellow  Treat  MEWS Interventions Considered administering scheduled or prn medications/treatments as ordered  Take Vital Signs  Increase Vital Sign Frequency  Yellow: Q2hr x1, continue Q4hrs until patient remains green for 12hrs  Escalate  MEWS: Escalate Yellow: Discuss with charge nurse and consider notifying provider and/or RRT  Notify: Charge Nurse/RN  Name of Charge Nurse/RN Notified [facility] RN  Provider Notification  Provider Name/Title Vassie Loll, MD  Date Provider Notified 07/08/22  Time Provider Notified 1057  Method of Notification Page (Secure chat)  Notification Reason Other (Comment) (yellow MEWs)  Provider response No new orders  Date of Provider Response 07/08/22  Time of Provider Response 1100  Assess: SIRS CRITERIA  SIRS Temperature  0  SIRS Pulse 0  SIRS Respirations  1  SIRS WBC 0  SIRS Score Sum  1

## 2022-07-08 NOTE — Assessment & Plan Note (Signed)
-   Creatinine up to 1.46 from 1.2 - Hold nephrotoxic agents when possible - 1.5 L given in the ED - Trend in the a.m.

## 2022-07-08 NOTE — Assessment & Plan Note (Signed)
Secondary to borderline hypoxia and dehydration - 1.5 L given in the ED - Trend lactic acid

## 2022-07-08 NOTE — Assessment & Plan Note (Signed)
-   Hold metformin and Actos - Sliding scale coverage - Continue to monitor

## 2022-07-09 ENCOUNTER — Other Ambulatory Visit: Payer: Self-pay | Admitting: *Deleted

## 2022-07-09 DIAGNOSIS — R7989 Other specified abnormal findings of blood chemistry: Secondary | ICD-10-CM | POA: Diagnosis not present

## 2022-07-09 DIAGNOSIS — I10 Essential (primary) hypertension: Secondary | ICD-10-CM | POA: Diagnosis not present

## 2022-07-09 DIAGNOSIS — J189 Pneumonia, unspecified organism: Secondary | ICD-10-CM

## 2022-07-09 DIAGNOSIS — E872 Acidosis, unspecified: Secondary | ICD-10-CM | POA: Diagnosis not present

## 2022-07-09 LAB — CBC
HCT: 27.9 % — ABNORMAL LOW (ref 39.0–52.0)
Hemoglobin: 8.4 g/dL — ABNORMAL LOW (ref 13.0–17.0)
MCH: 19.1 pg — ABNORMAL LOW (ref 26.0–34.0)
MCHC: 30.1 g/dL (ref 30.0–36.0)
MCV: 63.6 fL — ABNORMAL LOW (ref 80.0–100.0)
Platelets: 181 10*3/uL (ref 150–400)
RBC: 4.39 MIL/uL (ref 4.22–5.81)
RDW: 16.6 % — ABNORMAL HIGH (ref 11.5–15.5)
WBC: 3.4 10*3/uL — ABNORMAL LOW (ref 4.0–10.5)
nRBC: 0 % (ref 0.0–0.2)

## 2022-07-09 LAB — BASIC METABOLIC PANEL
Anion gap: 13 (ref 5–15)
BUN: 18 mg/dL (ref 8–23)
CO2: 19 mmol/L — ABNORMAL LOW (ref 22–32)
Calcium: 7.9 mg/dL — ABNORMAL LOW (ref 8.9–10.3)
Chloride: 106 mmol/L (ref 98–111)
Creatinine, Ser: 1.28 mg/dL — ABNORMAL HIGH (ref 0.61–1.24)
GFR, Estimated: 59 mL/min — ABNORMAL LOW (ref >60)
Glucose, Bld: 125 mg/dL — ABNORMAL HIGH (ref 70–99)
Potassium: 3.6 mmol/L (ref 3.5–5.1)
Sodium: 138 mmol/L (ref 135–145)

## 2022-07-09 LAB — GLUCOSE, CAPILLARY
Glucose-Capillary: 122 mg/dL — ABNORMAL HIGH (ref 70–99)
Glucose-Capillary: 174 mg/dL — ABNORMAL HIGH (ref 70–99)

## 2022-07-09 LAB — CULTURE, BLOOD (ROUTINE X 2)

## 2022-07-09 MED ORDER — LISINOPRIL 10 MG PO TABS: 10.0000 mg | ORAL_TABLET | Freq: Every day | ORAL | 1 refills | Status: DC

## 2022-07-09 MED ORDER — ATORVASTATIN CALCIUM 20 MG PO TABS: 20.0000 mg | ORAL_TABLET | Freq: Every day | ORAL | 3 refills | Status: DC

## 2022-07-09 MED ORDER — ALBUTEROL SULFATE HFA 108 (90 BASE) MCG/ACT IN AERS: 2.0000 | INHALATION_SPRAY | Freq: Four times a day (QID) | RESPIRATORY_TRACT | 2 refills | Status: DC | PRN

## 2022-07-09 MED ORDER — IPRATROPIUM-ALBUTEROL 0.5-2.5 (3) MG/3ML IN SOLN: 3.0000 mL | Freq: Three times a day (TID) | RESPIRATORY_TRACT | Status: DC

## 2022-07-09 MED ORDER — PREDNISONE 20 MG PO TABS: 40.0000 mg | ORAL_TABLET | Freq: Every day | ORAL | 0 refills | Status: DC

## 2022-07-09 MED ORDER — CEFDINIR 300 MG PO CAPS: 300.0000 mg | ORAL_CAPSULE | Freq: Two times a day (BID) | ORAL | 0 refills | Status: AC

## 2022-07-09 MED ORDER — PIOGLITAZONE HCL 45 MG PO TABS: 45.0000 mg | ORAL_TABLET | Freq: Every day | ORAL | 3 refills | Status: DC

## 2022-07-09 MED ORDER — SODIUM CHLORIDE 0.9 % IV SOLN: INTRAVENOUS | Status: DC

## 2022-07-09 MED ORDER — GUAIFENESIN ER 600 MG PO TB12: 600.0000 mg | ORAL_TABLET | Freq: Two times a day (BID) | ORAL | 2 refills | Status: DC

## 2022-07-09 MED ORDER — AMLODIPINE BESYLATE 10 MG PO TABS
10.0000 mg | ORAL_TABLET | Freq: Every day | ORAL | 3 refills | Status: AC
Start: 2022-07-09 — End: ?

## 2022-07-09 MED ORDER — AZITHROMYCIN 500 MG PO TABS: 500.0000 mg | ORAL_TABLET | Freq: Every day | ORAL | 0 refills | Status: AC

## 2022-07-09 MED ORDER — METFORMIN HCL 1000 MG PO TABS: 1000.0000 mg | ORAL_TABLET | Freq: Two times a day (BID) | ORAL | 2 refills | Status: DC

## 2022-07-09 MED ORDER — HYDROCHLOROTHIAZIDE 25 MG PO TABS: 12.5000 mg | ORAL_TABLET | Freq: Every day | ORAL | 2 refills | Status: DC

## 2022-07-09 NOTE — Progress Notes (Signed)
Mobility Specialist Progress Note:    07/09/22 0945  Mobility  Activity Ambulated with assistance in hallway  Level of Assistance Modified independent, requires aide device or extra time  Assistive Device Other (Comment) (IV pole)  Distance Ambulated (ft) 150 ft  Range of Motion/Exercises Active;All extremities  Activity Response Tolerated well  Mobility Referral Yes  $Mobility charge 1 Mobility  Mobility Specialist Start Time (ACUTE ONLY) 0945  Mobility Specialist Stop Time (ACUTE ONLY) 1000  Mobility Specialist Time Calculation (min) (ACUTE ONLY) 15 min   Pt received ambulating to bathroom, agreeable to mobility session and O2 test. Ambulated 150 ft in hallway, tolerated well, SpO2 92% on RA. Mod I with IV pole required for safety. Returned pt to room, wife at bedside, pt sitting up in chair, all needs met, call bell in reach.   Feliciana Rossetti Mobility Specialist Please contact via Special educational needs teacher or  Rehab office at [phone]

## 2022-07-09 NOTE — Discharge Summary (Signed)
Zachary Chandler, is a 72 y.o. male  DOB 30-Mar-1950  MRN [id number].  Admission date:  07/07/2022  Admitting Physician  Zachary Gilford, DO  Discharge Date:  07/09/2022   Primary MD  Zachary Massed, MD  Recommendations for primary care physician for things to follow:   1)It is very important that you get a Repeat CT chest test in 3 months to make sure there are No persistent abnormal findings 2)Hold Lisinopril and Metformin until 07/11/22 3)Repeat BMP blood test in 4 to 6 days  Admission Diagnosis  Lactic acidosis [E87.20] Pneumonia [J18.9] Community acquired pneumonia, unspecified laterality [J18.9]   Discharge Diagnosis  Lactic acidosis [E87.20] Pneumonia [J18.9] Community acquired pneumonia, unspecified laterality [J18.9]    Principal Problem:   Pneumonia Active Problems:   HTN (hypertension), benign   Diabetes mellitus with complication (HCC)   Hyperlipidemia   Elevated serum creatinine   Lactic acidosis      Past Medical History:  Diagnosis Date   Carpal tunnel syndrome on left    Cataract 07/2015   R eye--surgery recommended by his eye MD 08/03/15   Chronic fatigue    Chronic renal insufficiency, stage 3 (moderate) (HCC) 2021   Cr 1.2-1.4, GFR 50s-60s   Closed fracture of greater tuberosity of right humerus 02/28/2019   Immobil w/sling (Zachary Chandler)   Colon cancer screening 02/15/2018   iFOB neg 02/2018,  06/2020, and 09/2021.   Erectile dysfunction    Zachary Chandler.  Testosterone normal 04/2016 at [facility].   Gout of big toe 1990s   only one episode; he cut back on peanut butter and hasn't had any prob since   Herpes simplex ophthalmicus    L eye, has opthalmologist, uses prednisone drops prn   History of gallstones    per pt report   History of kidney stones    History of prostatitis 1980s   Remote hx of seeing Zachary Chandler at [facility]--then Zachary Chandler.     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 Zachary Chandler   Shingles    recurrent L thorax   Sinus disease    Thalassemia    Mild anemia with MCV 60s (alpha thal minor or beta thal trait: Hb electrophoresis not done/no result available.).   Type 2 diabetes mellitus with microalbuminuria (HCC) Dx'd 08/2013    Past Surgical History:  Procedure Laterality Date   CHOLECYSTECTOMY N/A 01/26/2020   Procedure: LAPAROSCOPIC CHOLECYSTECTOMY;  Surgeon: Zachary Miyamoto, MD;  Location: [facility];  Service: General;  Laterality: N/A;   NASAL POLYP EXCISION  1960   NASAL SINUS SURGERY  03/20/2022   WISDOM TOOTH EXTRACTION         HPI  from the history and physical done on the day of admission:   HPI: Zachary Chandler is a 71 y.o. male with medical history significant of CKD stage III, hypertension, hyperlipidemia, diabetes mellitus type 2, and more presents the  ED with chief complaint of shortness of breath.  Patient reports that he had URI symptoms that started 5 days ago.  At first he thought it was seasonal allergies, but it continued to get worse.  He has had a cough it has been nonproductive.  He has had a subjective fever.  He felt clammy on the morning of the day of presentation.  He reports that his shortness of breath is better with exertion.  He has had chest tightness in the upper part of his chest.  He does not wear oxygen at home.  Since having albuterol and oxygen in the ED he is feeling better.  He had some nausea but no vomiting.  He thinks the nausea is associated with coughing spells.  He has not had any abdominal pain or dysuria.  He reports no change in his normal loose bowels secondary to cholecystectomy.  Patient has no other complaints at this time.   Patient does not smoke, does not drink, does not use illicit drugs.  He is vaccinated for COVID.  Patient is full code. Review of Systems:  As mentioned in the history of present illness. All other systems reviewed and are negative.     Hospital Course:    Assessment and Plan: 1)Sepsis secondary to CAP  Pneumonia--POA - Patient presented with tachycardia, tachypnea, hypoxia, lactic acidosis and is found to have leukopenia in the setting of newly found right sided community-acquired pneumonia on CT chest  Lactic acid peaked at 3.7 -COVID-negative -Blood and sputum cultures NGTD --- Patient was treated with Rocephin and Zithromax -  -Discharge on Omnicef, azithromycin mucolytics and bronchodilators steroids  2) acute hypoxic respiratory failure----secondary to #1 above -resolved with treatment as above #1 -Post ambulation O2 sats 93 to 95% on room air at this time  3)AKI----acute kidney injury  -Creatinine improved to 1.28 from 1.55 this admission -Hold lisinopril and metformin for at least 48 hours postcontrast study -Repeat BMP with PCP in about 4 to 6 days  4)DM2-  -A1c 6.7 reflecting good diabetic control PTA -Hold metformin due to contrast study as advised -Continue Actos  5)HTN (hypertension), benign - Hold lisinopril as above #3  -continue amlodipine  6) abnormal CT chest findings----repeat CT chest in 3 to 4 months advised with PCP after treatment for pneumonia -Patient and wife verbalized understanding  Discharge Condition: stable  Follow UP--- PCP for repeat CT chest and BMP test   Follow-up Information     Chandler, Zachary Morn, MD. Schedule an appointment as soon as possible for a visit on 07/14/2022.   Specialty: Family Medicine Why: Repeat BMP Blood Test Contact information: [address] [place] [address] [phone]                Diet and Activity recommendation:  As advised  Discharge Instructions    Discharge Instructions     Call MD for:  difficulty breathing, headache or visual disturbances   Complete by: As directed    Call MD for:  persistant dizziness or  light-headedness   Complete by: As directed    Call MD for:  persistant nausea and vomiting   Complete by: As directed    Call MD for:  temperature >100.4   Complete by: As directed    Diet - low sodium heart healthy   Complete by: As directed    Diet Carb Modified   Complete by: As directed    Discharge instructions   Complete by: As directed  1)It is very important that you get a Repeat CT chest test in 3 months to make sure there are No persistent abnormal findings 2)Hold Lisinopril and Metformin until 07/11/22 3)Repeat BMP blood test in 4 to 6 days   Increase activity slowly   Complete by: As directed        Discharge Medications     Allergies as of 07/09/2022   No Known Allergies      Medication List     TAKE these medications    acetaminophen 500 MG tablet Commonly known as: TYLENOL Take 500-1,000 mg by mouth every 6 (six) hours as needed (pain.).   albuterol 108 (90 Base) MCG/ACT inhaler Commonly known as: VENTOLIN HFA Inhale 2 puffs into the lungs every 6 (six) hours as needed for wheezing or shortness of breath.   amLODipine 10 MG tablet Commonly known as: NORVASC Take 1 tablet (10 mg total) by mouth daily.   atorvastatin 20 MG tablet Commonly known as: LIPITOR Take 1 tablet (20 mg total) by mouth daily.   azithromycin 500 MG tablet Commonly known as: ZITHROMAX Take 1 tablet (500 mg total) by mouth daily for 3 days.   cefdinir 300 MG capsule Commonly known as: OMNICEF Take 1 capsule (300 mg total) by mouth 2 (two) times daily for 5 days.   cetirizine 10 MG tablet Commonly known as: ZYRTEC Take 10 mg by mouth daily.   cholestyramine 4 g packet Commonly known as: QUESTRAN Take 1 packet (4 g total) by mouth once a week.   fluticasone 50 MCG/ACT nasal spray Commonly known as: FLONASE Place 2 sprays into both nostrils daily.   guaiFENesin 600 MG 12 hr tablet Commonly known as: Mucinex Take 1 tablet (600 mg total) by mouth 2 (two) times  daily.   hydrochlorothiazide 25 MG tablet Commonly known as: HYDRODIURIL Take 0.5 tablets (12.5 mg total) by mouth daily. What changed: how much to take   hydrocortisone valerate ointment 0.2 % Commonly known as: WEST-CORT Appy bid to affected area prn   lisinopril 10 MG tablet Commonly known as: ZESTRIL Take 1 tablet (10 mg total) by mouth daily. Start taking on: Jul 11, 2022 What changed: These instructions start on Jul 11, 2022. If you are unsure what to do until then, ask your doctor or other care provider.   metFORMIN 1000 MG tablet Commonly known as: GLUCOPHAGE Take 1 tablet (1,000 mg total) by mouth 2 (two) times daily with a meal. Start taking on: Jul 11, 2022 What changed: These instructions start on Jul 11, 2022. If you are unsure what to do until then, ask your doctor or other care provider.   pioglitazone 45 MG tablet Commonly known as: ACTOS Take 1 tablet (45 mg total) by mouth daily.   prednisoLONE acetate 1 % ophthalmic suspension Commonly known as: PRED FORTE Place 1 drop into both eyes daily as needed (eye irritation).   predniSONE 20 MG tablet Commonly known as: DELTASONE Take 2 tablets (40 mg total) by mouth daily with breakfast for 5 days. What changed:  how much to take how to take this when to take this additional instructions   sodium chloride 0.65 % Soln nasal spray Commonly known as: OCEAN Place 1 spray into both nostrils as needed for congestion.        Major procedures and Radiology Reports - PLEASE review detailed and final reports for all details, in brief -   CT Chest W Contrast  Result Date: 07/08/2022 CLINICAL DATA:  Respiratory illness,  nondiagnostic xray EXAM: CT CHEST WITH CONTRAST TECHNIQUE: Multidetector CT imaging of the chest was performed during intravenous contrast administration. RADIATION DOSE REDUCTION: This exam was performed according to the departmental dose-optimization program which includes automated exposure  control, adjustment of the mA and/or kV according to patient size and/or use of iterative reconstruction technique. CONTRAST:  75mL OMNIPAQUE IOHEXOL 300 MG/ML  SOLN COMPARISON:  Chest x-ray 07/07/2022 FINDINGS: Cardiovascular: Normal heart size. No significant pericardial effusion. The thoracic aorta is normal in caliber. At least moderate atherosclerotic plaque of the thoracic aorta. Four-vessel coronary artery calcifications. The main pulmonary artery is normal in caliber. No central pulmonary embolus. Limited evaluation more distally due to timing of contrast. Mediastinum/Nodes: Prominent mediastinal lymph nodes with an enlarged 1.2 cm subcarinal lymph node. No enlarged hilar or axillary lymph nodes. Trachea and esophagus demonstrate no significant findings. A 1.4 cm peripherally calcified right thyroid gland nodule. Not clinically significant; no follow-up imaging recommended (ref: J Am Coll Radiol. 2015 Feb;12(2): 143-50). Lungs/Pleura: Diffuse mild bronchial wall thickening. Right lower and upper lobe peribronchovascular patchy airspace opacities. No pulmonary nodule. No pulmonary mass. No pleural effusion. No pneumothorax. Upper Abdomen: Status post cholecystectomy.  No acute abnormality. Musculoskeletal: No abdominal wall hernia or abnormality. No suspicious lytic or blastic osseous lesions. No acute displaced fracture. IMPRESSION: 1. Right lower and upper lobe peribronchovascular patchy airspace opacities. Findings suggestive of infection. Recommend follow-up CT in 3 months to evaluate for complete resolution. 2. Enlarged subcarinal lymph node and prominent mediastinal lymph nodes likely reactive in etiology. 3. Aortic Atherosclerosis (ICD10-I70.0) including four-vessel coronary calcification. Electronically Signed   By: Tish Frederickson M.D.   On: 07/08/2022 00:05   DG Chest Port 1 View  Result Date: 07/07/2022 CLINICAL DATA:  Shortness of breath with cough and malaise. EXAM: PORTABLE CHEST 1 VIEW  COMPARISON:  None Available. FINDINGS: The heart size and mediastinal contours are within normal limits. Low lung volumes are noted with mild linear atelectasis seen within the left lung base. Mild, ill-defined nodular appearing infiltrate is suspected within the mid right lung. No pleural effusion or pneumothorax is identified. Multilevel degenerative changes seen throughout the thoracic spine. IMPRESSION: Mild, ill-defined nodular appearing infiltrate within the mid right lung. Correlation with chest CT is recommended. Electronically Signed   By: Aram Candela M.D.   On: 07/07/2022 22:46    Micro Results   Recent Results (from the past 240 hour(s))  SARS Coronavirus 2 by RT PCR (hospital order, performed in [facility] hospital lab) *cepheid single result test* Anterior Nasal Swab     Status: None   Collection Time: 07/07/22 11:00 PM   Specimen: Anterior Nasal Swab  Result Value Ref Range Status   SARS Coronavirus 2 by RT PCR NEGATIVE NEGATIVE Final    Comment: (NOTE) SARS-CoV-2 target nucleic acids are NOT DETECTED.  The SARS-CoV-2 RNA is generally detectable in upper and lower respiratory specimens during the acute phase of infection. The lowest concentration of SARS-CoV-2 viral copies this assay can detect is 250 copies / mL. A negative result does not preclude SARS-CoV-2 infection and should not be used as the sole basis for treatment or other patient management decisions.  A negative result may occur with improper specimen collection / handling, submission of specimen other than nasopharyngeal swab, presence of viral mutation(s) within the areas targeted by this assay, and inadequate number of viral copies (<250 copies / mL). A negative result must be combined with clinical observations, patient history, and epidemiological information.  Fact Sheet  for Patients:   RoadLapTop.co.za  Fact Sheet for Healthcare  Providers: http://kim-miller.com/  This test is not yet approved or  cleared by the [place] FDA and has been authorized for detection and/or diagnosis of SARS-CoV-2 by FDA under an Emergency Use Authorization (EUA).  This EUA will remain in effect (meaning this test can be used) for the duration of the COVID-19 declaration under Section 564(b)(1) of the Act, 21 U.S.C. section 360bbb-3(b)(1), unless the authorization is terminated or revoked sooner.  Performed at [facility], [address] [place], [place] [address]   Culture, blood (routine x 2)     Status: None (Preliminary result)   Collection Time: 07/07/22 11:04 PM   Specimen: BLOOD  Result Value Ref Range Status   Specimen Description BLOOD LEFT ANTECUBITAL  Final   Special Requests   Final    BOTTLES DRAWN AEROBIC AND ANAEROBIC Blood Culture adequate volume   Culture   Final    NO GROWTH 2 DAYS Performed at [facility], [address], [place], [place] [address]    Report Status PENDING  Incomplete  Culture, blood (routine x 2)     Status: None (Preliminary result)   Collection Time: 07/07/22 11:06 PM   Specimen: BLOOD  Result Value Ref Range Status   Specimen Description BLOOD BLOOD RIGHT HAND  Final   Special Requests   Final    BOTTLES DRAWN AEROBIC AND ANAEROBIC Blood Culture adequate volume   Culture   Final    NO GROWTH 2 DAYS Performed at [facility], [address] [place], [place] [address]    Report Status PENDING  Incomplete  Expectorated Sputum Assessment w Gram Stain, Rflx to Resp Cult     Status: None   Collection Time: 07/08/22  9:00 PM   Specimen: Expectorated Sputum  Result Value Ref Range Status   Specimen Description EXPECTORATED SPUTUM  Final   Special Requests NONE  Final   Sputum evaluation   Final    Sputum specimen not acceptable for testing.  Please recollect.   C/TO [facility] HARDIN AT 2231 07/08/2022 BY T Kyung Rudd Performed at [facility], [address]  [place], [place] [address]    Report Status 07/08/2022 FINAL  Final   Today   Subjective    Colletta Maryland today has no new complaints -Cough and dyspnea has improved significantly -Hypoxia has resolved -Ambulated with mobility specialist O2 sats 92 to 95% on room air post ambulation - Wife at bedside, questions answered         Patient has been seen and examined prior to discharge   Objective   Blood pressure (!) 144/63, pulse 74, temperature 98.5 F (36.9 C), temperature source Oral, resp. rate (!) 24, height 6\' 1"  (1.854 m), weight 115.2 kg, SpO2 91 %.   Intake/Output Summary (Last 24 hours) at 07/09/2022 1151 Last data filed at 07/09/2022 0900 Gross per 24 hour  Intake 2894.39 ml  Output --  Net 2894.39 ml   Exam Gen:- Awake Alert, no acute distress , no conversational dyspnea HEENT:- [place].AT, No sclera icterus Neck-Supple Neck,No JVD,.  Lungs-improved air movement, no wheezing CV- S1, S2 normal, regular Abd-  +ve B.Sounds, Abd Soft, No tenderness,    Extremity/Skin:- No  edema,   good pulses Psych-affect is appropriate, oriented x3 Neuro-no new focal deficits, no tremors    Data Review   CBC w Diff:  Lab Results  Component Value Date   WBC 3.4 (L) 07/09/2022   HGB 8.4 (L) 07/09/2022  HCT 27.9 (L) 07/09/2022   PLT 181 07/09/2022   LYMPHOPCT 10 07/08/2022   MONOPCT 12 07/08/2022   EOSPCT 0 07/08/2022   BASOPCT 0 07/08/2022   CMP:  Lab Results  Component Value Date   NA 138 07/09/2022   K 3.6 07/09/2022   CL 106 07/09/2022   CO2 19 (L) 07/09/2022   BUN 18 07/09/2022   CREATININE 1.28 (H) 07/09/2022   CREATININE 1.51 (H) 01/09/2022   PROT 6.4 (L) 07/08/2022   ALBUMIN 3.4 (L) 07/08/2022   BILITOT 0.9 07/08/2022   ALKPHOS 58 07/08/2022   AST 38 07/08/2022   ALT 18 07/08/2022   Total Discharge time is about 33 minutes  Shon Hale M.D on 07/09/2022 at 11:51 AM  Go to [email or web address] -  for contact info  [facility] - Office   [phone]

## 2022-07-09 NOTE — Consult Note (Signed)
[facility] Customer service manager [name]) Accountable Care Organization (ACO) [facility] Liaison Note  07/09/2022  Zachary Chandler [birth year] [id number]  Location: [facility] RN Hospital Liaison screened the patient remotely at [facility].  Insurance: Zachary Chandler is a 72 y.o. male who is a Primary Care Patient of McGowen, Maryjean Morn, MD. The patient was screened for readmission hospitalization with noted medium risk score for unplanned readmission risk with 1 IP in 6 months.  The patient was assessed for potential [facility]) Care Management service needs for post hospital transition for care coordination. Review of patient's electronic medical record reveals patient was admitted for Pneumonia. [facility] liaison attempt outreach call however unsuccessful. Based upon pt's admitting diagnosis will make a referral for [facility] RN care coordinator to follow up for care management skills.    Plan: [facility] Liaison will continue to follow progress and disposition to asess for post hospital community care coordination/management needs.  Referral request for community care coordination: pending disposition.   [facility] Care Management/Population Health does not replace or interfere with any arrangements made by the Inpatient Transition of Care team.   For questions contact:   [name], RN, BSN [facility] Liaison [place]   [facility]  Population Health Office Hours MTWF  8:00 am-6:00 pm Off on Thursday [phone] mobile [phone] [Office toll free line][facility] Office Hours are M-F 8:30 - 5 pm 24 hour nurse advise line [phone] Concierge  [name]

## 2022-07-09 NOTE — Progress Notes (Signed)
   07/09/22 0417  Vitals  Temp 98.5 F (36.9 C)  Temp Source Oral  BP (!) 144/63  MAP (mmHg) 87  BP Location Left Arm  BP Method Automatic  Patient Position (if appropriate) Lying  Pulse Rate 74  Pulse Rate Source Monitor  Resp (!) 24  MEWS COLOR  MEWS Score Color Green  Oxygen Therapy  SpO2 95 %  O2 Device Nasal Cannula  O2 Flow Rate (L/min) 2 L/min  MEWS Score  MEWS Temp 0  MEWS Systolic 0  MEWS Pulse 0  MEWS RR 1  MEWS LOC 0  MEWS Score 1

## 2022-07-09 NOTE — Progress Notes (Signed)
Lab called, expectorated sputum sample not acceptable for testing. Needs recollected.

## 2022-07-09 NOTE — Progress Notes (Signed)
SATURATION QUALIFICATIONS: (This note is used to comply with regulatory documentation for home oxygen)  Patient Saturations on Room Air at Rest = 95%  Patient Saturations on Room Air while Ambulating = 92%   

## 2022-07-09 NOTE — Discharge Instructions (Signed)
1)It is very important that you get a Repeat CT chest test in 3 months to make sure there are No persistent abnormal findings 2)Hold Lisinopril and Metformin until 07/11/22 3)Repeat BMP blood test in 4 to 6 days

## 2022-07-10 ENCOUNTER — Telehealth: Payer: Self-pay | Admitting: *Deleted

## 2022-07-10 ENCOUNTER — Encounter: Payer: Self-pay | Admitting: Family Medicine

## 2022-07-10 ENCOUNTER — Telehealth: Payer: Self-pay

## 2022-07-10 LAB — CULTURE, BLOOD (ROUTINE X 2)

## 2022-07-10 NOTE — Transitions of Care (Post Inpatient/ED Visit) (Signed)
   07/10/2022  Name: Zachary Chandler MRN: [id number] DOB: 01/23/1951  Today's TOC FU Call Status: Today's TOC FU Call Status:: Unsuccessul Call (1st Attempt) Unsuccessful Call (1st Attempt) Date: 07/10/22  Attempted to reach the patient regarding the most recent Inpatient/ED visit.  Follow Up Plan: Additional outreach attempts will be made to reach the patient to complete the Transitions of Care (Post Inpatient/ED visit) call.     Antionette Fairy, RN,BSN,CCM [facility] Health/[facility] Care Management Care Management Community Coordinator Direct Phone: [phone] Toll Free: [phone] Fax: [phone]

## 2022-07-10 NOTE — Progress Notes (Signed)
  Care Coordination   Note   07/10/2022 Name: ZACCARI CANUTO MRN: [id number] DOB: June 03, 1950  [name] is a 72 y.o. year old male who sees McGowen, Maryjean Morn, MD for primary care. I reached out to [name] by phone today to offer care coordination services.  Mr. Jani was given information about Care Coordination services today including:   The Care Coordination services include support from the care team which includes your Nurse Coordinator, Clinical Social Worker, or Pharmacist.  The Care Coordination team is here to help remove barriers to the health concerns and goals most important to you. Care Coordination services are voluntary, and the patient may decline or stop services at any time by request to their care team member.   Care Coordination Consent Status: Patient agreed to services and verbal consent obtained.   Follow up plan:  Telephone appointment with care coordination team member scheduled for:  07/31/2022   Encounter Outcome:  Pt. Scheduled from referral   Burman Nieves, [facility] Care Coordination Care Guide Direct Dial: [phone]

## 2022-07-10 NOTE — Progress Notes (Signed)
  Care Coordination  Outreach Note  07/10/2022 Name: ALEXANDRU SARDUY MRN: [id number] DOB: [birth year]   Care Coordination Outreach Attempts: An unsuccessful telephone outreach was attempted today to offer the patient information about available care coordination services.  Follow Up Plan:  Additional outreach attempts will be made to offer the patient care coordination information and services.   Encounter Outcome:  No Answer  Burman Nieves, CCMA Care Coordination Care Guide Direct Dial: [phone]

## 2022-07-11 ENCOUNTER — Telehealth: Payer: Self-pay

## 2022-07-11 NOTE — Transitions of Care (Post Inpatient/ED Visit) (Signed)
   07/11/2022  Name: Zachary Chandler MRN: [id number] DOB: Jun 30, 1950  Today's TOC FU Call Status:    Attempted to reach the patient regarding the most recent Inpatient/ED visit.  Follow Up Plan: No further outreach attempts will be made at this time. We have been unable to contact the patient.    Antionette Fairy, RN,BSN,CCM [facility] Health/[facility] Care Management Care Management Community Coordinator Direct Phone: [phone] Toll Free: [phone] Fax: [phone]

## 2022-07-11 NOTE — Transitions of Care (Post Inpatient/ED Visit) (Signed)
   07/11/2022  Name: AARION EDENFIELD MRN: [id number] DOB: 1951-02-07  Today's TOC FU Call Status: Today's TOC FU Call Status:: Unsuccessful Call (2nd Attempt) Unsuccessful Call (2nd Attempt) Date: 07/11/22  Attempted to reach the patient regarding the most recent Inpatient/ED visit.  Follow Up Plan: Additional outreach attempts will be made to reach the patient to complete the Transitions of Care (Post Inpatient/ED visit) call.     Antionette Fairy, RN,BSN,CCM [facility] Health/[facility] Care Management Care Management Community Coordinator Direct Phone: [phone] Toll Free: [phone] Fax: [phone]

## 2022-07-12 LAB — CULTURE, BLOOD (ROUTINE X 2): Culture: NO GROWTH

## 2022-07-15 ENCOUNTER — Ambulatory Visit (INDEPENDENT_AMBULATORY_CARE_PROVIDER_SITE_OTHER): Payer: Medicare HMO | Admitting: Family Medicine

## 2022-07-15 ENCOUNTER — Encounter: Payer: Self-pay | Admitting: Family Medicine

## 2022-07-15 VITALS — BP 140/80 | HR 77 | Wt 250.0 lb

## 2022-07-15 DIAGNOSIS — R918 Other nonspecific abnormal finding of lung field: Secondary | ICD-10-CM | POA: Diagnosis not present

## 2022-07-15 DIAGNOSIS — R7989 Other specified abnormal findings of blood chemistry: Secondary | ICD-10-CM | POA: Diagnosis not present

## 2022-07-15 DIAGNOSIS — R9389 Abnormal findings on diagnostic imaging of other specified body structures: Secondary | ICD-10-CM

## 2022-07-15 DIAGNOSIS — E1129 Type 2 diabetes mellitus with other diabetic kidney complication: Secondary | ICD-10-CM | POA: Diagnosis not present

## 2022-07-15 DIAGNOSIS — J189 Pneumonia, unspecified organism: Secondary | ICD-10-CM | POA: Diagnosis not present

## 2022-07-15 DIAGNOSIS — Z7984 Long term (current) use of oral hypoglycemic drugs: Secondary | ICD-10-CM

## 2022-07-15 DIAGNOSIS — N1831 Chronic kidney disease, stage 3a: Secondary | ICD-10-CM | POA: Diagnosis not present

## 2022-07-15 DIAGNOSIS — R809 Proteinuria, unspecified: Secondary | ICD-10-CM | POA: Diagnosis not present

## 2022-07-15 NOTE — Patient Instructions (Addendum)
Don't take your metformin, atorvastatin, or actos for now

## 2022-07-15 NOTE — Progress Notes (Addendum)
07/15/2022  CC:  Chief Complaint  Patient presents with   Follow-up    HP follow up. Wife wants to discuss meds and see if they all need to be taken in the mornings.    Patient is a 72 y.o. Caucasian male who presents accompanied by his wife Darel Hong for  hospital follow up, specifically [facility] face-to-face visit. Dates hospitalized: 07/07/2022 to 07/09/2022. Days since d/c from hospital: 6 days Patient was discharged from hospital to home Reason for admission to hospital: Pneumonia, lactic acidosis Date of interactive (phone) contact with patient and/or caregiver: 3 attempts were made without success. (5/30 and twice on 5/31).  I have reviewed patient's discharge summary plus pertinent specific notes, labs, and imaging from the hospitalization.   CT chest w/contrast 07/07/22: "IMPRESSION: 1. Right lower and upper lobe peribronchovascular patchy airspace opacities. Findings suggestive of infection. Recommend follow-up CT in 3 months to evaluate for complete resolution. 2. Enlarged subcarinal lymph node and prominent mediastinal lymph nodes likely reactive in etiology. 3. Aortic Atherosclerosis (ICD10-I70.0) including four-vessel coronary calcification."  CURRENTLY: Cough is improving gradually.  Energy level still quite down but getting a little bit better.  He has finished his azithromycin and cefdinir. He has his baseline level of loose stools that he has had since having cholecystectomy.  No blood in stool. No fevers or shortness of breath.  He is taking albuterol every 6 hours and this helps him get a little bit of mucus up.    Medication reconciliation was done today and patient is taking meds as recommended by discharging hospitalist/specialist.   Albuterol every 6 hours, amlodipine 10 mg a day, atorvastatin 20 mg a day, cetirizine 10 mg a day, cholestyramine 4 g as needed, Flonase, Mucinex, HCTZ 25 mg daily, lisinopril 10 mg daily metformin 1000 mg twice a day, and  pioglitazone 45 mg a day. It appears that he was prescribed prednisone at discharge but patient states he was prescribed this only to take as needed gout flare.  He has not been taking this medication.  ROS as above, plus--> no CP,  no dizziness, no HAs, no rashes, no melena/hematochezia.  No polyuria or polydipsia.  No myalgias or arthralgias.  No focal weakness, paresthesias, or tremors.  No acute vision or hearing abnormalities.  No dysuria or unusual/new urinary urgency or frequency.  No recent changes in lower legs (no pain or swelling). No n/v or abd pain.  No palpitations.    PMH:  Past Medical History:  Diagnosis Date   Carpal tunnel syndrome on left    Cataract 07/2015   R eye--surgery recommended by his eye MD 08/03/15   Chronic fatigue    Chronic renal insufficiency, stage 3 (moderate) (HCC) 2021   Cr 1.2-1.4, GFR 50s-60s   Closed fracture of greater tuberosity of right humerus 02/28/2019   Immobil w/sling (Dr. Dion Saucier)   Colon cancer screening 02/15/2018   iFOB neg 02/2018,  06/2020, and 09/2021.   Community acquired pneumonia    06/2022 +sepsis--> hospitalization.  Needs follow-up CT with contrast chest in 3 months.   Erectile dysfunction    Dr. Patsi Sears.  Testosterone normal 04/2016 at [facility].   Gout of big toe 1990s   only one episode; he cut back on peanut butter and hasn't had any prob since   Herpes simplex ophthalmicus    L eye, has opthalmologist, uses prednisone drops prn   History of gallstones    per pt report   History of kidney stones  History of prostatitis 1980s   Remote hx of seeing Dr. Vonita Moss at [facility] urology--then Dr. Patsi Sears.    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 Dr. Jorja Loa   Shingles    recurrent L thorax   Sinus disease    Thalassemia    Mild anemia with MCV 60s (alpha thal minor or beta thal trait: Hb electrophoresis not done/no  result available.).   Type 2 diabetes mellitus with microalbuminuria (HCC) Dx'd 08/2013    PSH:  Past Surgical History:  Procedure Laterality Date   CHOLECYSTECTOMY N/A 01/26/2020   Procedure: LAPAROSCOPIC CHOLECYSTECTOMY;  Surgeon: Abigail Miyamoto, MD;  Location: [facility];  Service: General;  Laterality: N/A;   NASAL POLYP EXCISION  1960   NASAL SINUS SURGERY  03/20/2022   WISDOM TOOTH EXTRACTION      MEDS:  Outpatient Medications Prior to Visit  Medication Sig Dispense Refill   acetaminophen (TYLENOL) 500 MG tablet Take 500-1,000 mg by mouth every 6 (six) hours as needed (pain.).     albuterol (VENTOLIN HFA) 108 (90 Base) MCG/ACT inhaler Inhale 2 puffs into the lungs every 6 (six) hours as needed for wheezing or shortness of breath. 8 g 2   amLODipine (NORVASC) 10 MG tablet Take 1 tablet (10 mg total) by mouth daily. 90 tablet 3   atorvastatin (LIPITOR) 20 MG tablet Take 1 tablet (20 mg total) by mouth daily. 90 tablet 3   cetirizine (ZYRTEC) 10 MG tablet Take 10 mg by mouth daily.     guaiFENesin (MUCINEX) 600 MG 12 hr tablet Take 1 tablet (600 mg total) by mouth 2 (two) times daily. 60 tablet 2   hydrochlorothiazide (HYDRODIURIL) 25 MG tablet Take 0.5 tablets (12.5 mg total) by mouth daily. 90 tablet 2   lisinopril (ZESTRIL) 10 MG tablet Take 1 tablet (10 mg total) by mouth daily. 90 tablet 1   metFORMIN (GLUCOPHAGE) 1000 MG tablet Take 1 tablet (1,000 mg total) by mouth 2 (two) times daily with a meal. 180 tablet 2   pioglitazone (ACTOS) 45 MG tablet Take 1 tablet (45 mg total) by mouth daily. 90 tablet 3   prednisoLONE acetate (PRED FORTE) 1 % ophthalmic suspension Place 1 drop into both eyes daily as needed (eye irritation).     cholestyramine (QUESTRAN) 4 g packet Take 1 packet (4 g total) by mouth once a week. (Patient not taking: Reported on 07/15/2022) 21 each 3   fluticasone (FLONASE) 50 MCG/ACT nasal spray Place 2 sprays into both nostrils daily. (Patient not taking: Reported  on 07/15/2022)     hydrocortisone valerate ointment (WEST-CORT) 0.2 % Appy bid to affected area prn (Patient not taking: Reported on 07/15/2022) 15 g 2   sodium chloride (OCEAN) 0.65 % SOLN nasal spray Place 1 spray into both nostrils as needed for congestion. (Patient not taking: Reported on 07/15/2022)     No facility-administered medications prior to visit.    Physical Exam    07/15/2022   11:33 AM 07/09/2022    4:17 AM 07/08/2022   11:45 PM  Vitals with BMI  Weight 250 lbs    BMI 32.99    Systolic 150 144 161  Diastolic 82 63 60  Pulse 77 74 75   General: Alert, no acute distress.  He appears tired. Oriented x 4, lucid. Regular rhythm and rate without murmur rub or gallop.  His rate is 75 on my exam. Lungs  are clear to auscultation bilaterally.  He has slightly diminished breath sounds in both bases.  Breathing is nonlabored.  No wheezing. Extremities: No edema.  Pertinent labs/imaging Last CBC Lab Results  Component Value Date   WBC 3.4 (L) 07/09/2022   HGB 8.4 (L) 07/09/2022   HCT 27.9 (L) 07/09/2022   MCV 63.6 (L) 07/09/2022   MCH 19.1 (L) 07/09/2022   RDW 16.6 (H) 07/09/2022   PLT 181 07/09/2022   Lab Results  Component Value Date   IRON 187 (H) 07/09/2016   FERRITIN 260.1 07/09/2016   Last metabolic panel Lab Results  Component Value Date   GLUCOSE 125 (H) 07/09/2022   NA 138 07/09/2022   K 3.6 07/09/2022   CL 106 07/09/2022   CO2 19 (L) 07/09/2022   BUN 18 07/09/2022   CREATININE 1.28 (H) 07/09/2022   GFRNONAA 59 (L) 07/09/2022   CALCIUM 7.9 (L) 07/09/2022   PROT 6.4 (L) 07/08/2022   ALBUMIN 3.4 (L) 07/08/2022   BILITOT 0.9 07/08/2022   ALKPHOS 58 07/08/2022   AST 38 07/08/2022   ALT 18 07/08/2022   ANIONGAP 13 07/09/2022   Last lipids Lab Results  Component Value Date   CHOL 124 10/08/2021   HDL 44.00 10/08/2021   LDLCALC 58 10/08/2021   TRIG 112.0 10/08/2021   CHOLHDL 3 10/08/2021   Last hemoglobin A1c Lab Results  Component Value Date    HGBA1C 6.7 (A) 04/14/2022   HGBA1C 6.7 04/14/2022   HGBA1C 6.7 (A) 04/14/2022   HGBA1C 6.7 (A) 04/14/2022   Last thyroid functions Lab Results  Component Value Date   TSH 0.84 07/24/2017    ASSESSMENT/PLAN:  #1 community-acquired pneumonia.  He is improving gradually. Encouraged good hydration, rest, Mucinex twice a day, and albuterol every 6 hours as needed. Abnormal chest x-ray and chest CT scan: Plan repeat CT approximately early September this year.  #2 chronic renal insufficiency stage III, acute kidney injury. Serum creatinine had returned to baseline on discharge. Relatively poor p.o. intake.  Okay to continue HCTZ and lisinopril but will check electrolytes and creatinine today as well.  #3 diabetes with microalbuminuria, well-controlled, oral medications only. At this point unguinal have him hold his metformin and Actos. Monitor glucose at home periodically over the next week.  #4 chronic microcytic anemia, recent worsening. He has a clinically insignificant thalassemia. His baseline hemoglobin is around 10.8.   It was 9.8 upon admission and dropped down to 8.4 on the day of discharge. Repeating hemoglobin today.  #5 prolonged QT interval.  QTc on EKG in the emergency department was 573.  Ventricular rate at that time was 111.  He had some mild tachycardia during the hospitalization.  No issues in the past with tachycardia or arrhythmia.  The only other EKG in our system is from 01/24/2020 and it does not show prolonged QT interval.  He is asymptomatic. Will try to avoid any medications that may prolong the QT interval. Plan repeat EKG when he is feeling better.  Medical decision making of high complexity was utilized today.  FOLLOW UP: 1 week  Signed:  Santiago Bumpers, MD           07/15/2022

## 2022-07-16 ENCOUNTER — Other Ambulatory Visit: Payer: Self-pay

## 2022-07-16 ENCOUNTER — Ambulatory Visit (HOSPITAL_COMMUNITY)
Admission: RE | Admit: 2022-07-16 | Discharge: 2022-07-16 | Disposition: A | Discharge status: Home / Self Care | Payer: Medicare HMO | Source: Ambulatory Visit | Attending: Family Medicine | Admitting: Family Medicine

## 2022-07-16 DIAGNOSIS — J189 Pneumonia, unspecified organism: Secondary | ICD-10-CM

## 2022-07-16 LAB — CBC WITH DIFFERENTIAL/PLATELET
Absolute Monocytes: 984 cells/uL — ABNORMAL HIGH (ref 200–950)
Basophils Absolute: 86 cells/uL (ref 0–200)
Basophils Relative: 0.7 %
Eosinophils Absolute: 12 cells/uL — ABNORMAL LOW (ref 15–500)
Eosinophils Relative: 0.1 %
HCT: 34.8 % — ABNORMAL LOW (ref 38.5–50.0)
Hemoglobin: 10.4 g/dL — ABNORMAL LOW (ref 13.2–17.1)
Lymphs Abs: 1636 cells/uL (ref 850–3900)
MCH: 19 pg — ABNORMAL LOW (ref 27.0–33.0)
MCHC: 29.9 g/dL — ABNORMAL LOW (ref 32.0–36.0)
MCV: 63.5 fL — ABNORMAL LOW (ref 80.0–100.0)
MPV: 11.4 fL (ref 7.5–12.5)
Monocytes Relative: 8 %
Neutro Abs: 9582 cells/uL — ABNORMAL HIGH (ref 1500–7800)
Neutrophils Relative %: 77.9 %
Platelets: 442 10*3/uL — ABNORMAL HIGH (ref 140–400)
RBC: 5.48 10*6/uL (ref 4.20–5.80)
RDW: 17.6 % — ABNORMAL HIGH (ref 11.0–15.0)
Total Lymphocyte: 13.3 %
WBC: 12.3 10*3/uL — ABNORMAL HIGH (ref 3.8–10.8)

## 2022-07-16 LAB — COMPREHENSIVE METABOLIC PANEL
AG Ratio: 1.5 (calc) (ref 1.0–2.5)
ALT: 21 U/L (ref 9–46)
AST: 16 U/L (ref 10–35)
Albumin: 4 g/dL (ref 3.6–5.1)
Alkaline phosphatase (APISO): 65 U/L (ref 35–144)
BUN/Creatinine Ratio: 18 (calc) (ref 6–22)
BUN: 25 mg/dL (ref 7–25)
CO2: 25 mmol/L (ref 20–32)
Calcium: 9.6 mg/dL (ref 8.6–10.3)
Chloride: 102 mmol/L (ref 98–110)
Creat: 1.39 mg/dL — ABNORMAL HIGH (ref 0.70–1.28)
Globulin: 2.7 g/dL (calc) (ref 1.9–3.7)
Glucose, Bld: 141 mg/dL — ABNORMAL HIGH (ref 65–99)
Potassium: 3.8 mmol/L (ref 3.5–5.3)
Sodium: 140 mmol/L (ref 135–146)
Total Bilirubin: 1.2 mg/dL (ref 0.2–1.2)
Total Protein: 6.7 g/dL (ref 6.1–8.1)

## 2022-07-17 ENCOUNTER — Telehealth: Payer: Self-pay | Admitting: Family Medicine

## 2022-07-17 NOTE — Telephone Encounter (Signed)
Zachary Chandler's wife called with concerns about him constantly being tired and fatigued. She is worried about the results of the x-ray. I informed her that she would receive a call once the results were released.

## 2022-07-17 NOTE — Telephone Encounter (Signed)
No results available yet.

## 2022-07-18 NOTE — Telephone Encounter (Signed)
Provider verbally made aware, he mentioned there is a staffing shortage so there will be a delay with receiving results. Pt was made aware results have not been read yet but we will follow up once this is completed.

## 2022-07-18 NOTE — Telephone Encounter (Signed)
Is there anyway we can give the x-ray location a call to get results released.  Pio and his wife or very concerned about the results and have called daily regarding this matter.

## 2022-07-21 ENCOUNTER — Telehealth: Payer: Self-pay

## 2022-07-21 NOTE — Telephone Encounter (Signed)
Patient wife concerned.  Patient was scheduled for f/u pneumonia on Wednesday, 6/10.  Appt was rescheduled to 6/17.  Patient was taking off some meds during this time, and he doesn't know whether to stay off until he sees Dr. Milinda Cave again on 6/17 or does he need to start back on meds before then.  Please advise [phone]

## 2022-07-21 NOTE — Telephone Encounter (Signed)
I want him to continue to stay off metformin and Actos. He got a chest x-ray on 07/16/2022 but it has not been read yet.  Please call radiology and request.

## 2022-07-21 NOTE — Telephone Encounter (Signed)
Please advise 

## 2022-07-22 NOTE — Telephone Encounter (Signed)
[facility] contacted to have chest x-ray read. Pt advised of update and provider recommendations.

## 2022-07-23 ENCOUNTER — Ambulatory Visit: Payer: Medicare HMO | Admitting: Family Medicine

## 2022-07-28 ENCOUNTER — Encounter: Payer: Self-pay | Admitting: Family Medicine

## 2022-07-28 ENCOUNTER — Ambulatory Visit (INDEPENDENT_AMBULATORY_CARE_PROVIDER_SITE_OTHER): Payer: Medicare HMO | Admitting: Family Medicine

## 2022-07-28 VITALS — BP 130/75 | HR 91 | Wt 248.0 lb

## 2022-07-28 DIAGNOSIS — Z7984 Long term (current) use of oral hypoglycemic drugs: Secondary | ICD-10-CM

## 2022-07-28 DIAGNOSIS — R809 Proteinuria, unspecified: Secondary | ICD-10-CM

## 2022-07-28 DIAGNOSIS — J189 Pneumonia, unspecified organism: Secondary | ICD-10-CM | POA: Diagnosis not present

## 2022-07-28 DIAGNOSIS — E1129 Type 2 diabetes mellitus with other diabetic kidney complication: Secondary | ICD-10-CM | POA: Diagnosis not present

## 2022-07-28 NOTE — Progress Notes (Signed)
OFFICE VISIT  07/28/2022  CC:  Chief Complaint  Patient presents with   Pneumonia    F/u. Pt states doing much better    Patient is a 72 y.o. male who presents for 2-week follow-up pneumonia. A/P as of last visit: "#1 community-acquired pneumonia.  He is improving gradually. Encouraged good hydration, rest, Mucinex twice a day, and albuterol every 6 hours as needed. Abnormal chest x-ray and chest CT scan: Plan repeat CT approximately early September this year.   #2 chronic renal insufficiency stage III, acute kidney injury. Serum creatinine had returned to baseline on discharge. Relatively poor p.o. intake.  Okay to continue HCTZ and lisinopril but will check electrolytes and creatinine today as well.   #3 diabetes with microalbuminuria, well-controlled, oral medications only. At this point I will have him hold his metformin and Actos. Monitor glucose at home periodically over the next week.   #4 chronic microcytic anemia, recent worsening. He has a clinically insignificant thalassemia. His baseline hemoglobin is around 10.8.   It was 9.8 upon admission and dropped down to 8.4 on the day of discharge. Repeating hemoglobin today.   #5 prolonged QT interval.  QTc on EKG in the emergency department was 573.  Ventricular rate at that time was 111.  He had some mild tachycardia during the hospitalization.  No issues in the past with tachycardia or arrhythmia.  The only other EKG in our system is from 01/24/2020 and it does not show prolonged QT interval.  He is asymptomatic. Will try to avoid any medications that may prolong the QT interval. Plan repeat EKG when he is feeling better."  INTERIM HX: Feeling better. Cough gradually subsiding, energy gradually returning. Glucoses did rise over 200 for couple of days so he restarted his Actos and metformin.  The last couple of days the glucoses have been down less than 160. No fever, no hemoptysis, no shortness of breath, no chest  pain.  Past Medical History:  Diagnosis Date   Carpal tunnel syndrome on left    Cataract 07/2015   R eye--surgery recommended by his eye MD 08/03/15   Chronic fatigue    Chronic renal insufficiency, stage 3 (moderate) (HCC) 2021   Cr 1.2-1.4, GFR 50s-60s   Closed fracture of greater tuberosity of right humerus 02/28/2019   Immobil w/sling (Dr. Dion Saucier)   Colon cancer screening 02/15/2018   iFOB neg 02/2018,  06/2020, and 09/2021.   Community acquired pneumonia    06/2022 +sepsis--> hospitalization.  Needs follow-up CT with contrast chest in 3 months.   Erectile dysfunction    Dr. Patsi Sears.  Testosterone normal 04/2016 at [facility].   Gout of big toe 1990s   only one episode; he cut back on peanut butter and hasn't had any prob since   Herpes simplex ophthalmicus    L eye, has opthalmologist, uses prednisone drops prn   History of gallstones    per pt report   History of kidney stones    History of prostatitis 1980s   Remote hx of seeing Dr. Vonita Moss at [facility]--then Dr. Patsi Sears.    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 Dr. Jorja Loa   Shingles    recurrent L thorax   Sinus disease    Thalassemia    Mild anemia with MCV 60s (alpha thal minor or beta thal trait: Hb electrophoresis not done/no result available.).   Type 2 diabetes mellitus  with microalbuminuria (HCC) Dx'd 08/2013    Past Surgical History:  Procedure Laterality Date   CHOLECYSTECTOMY N/A 01/26/2020   Procedure: LAPAROSCOPIC CHOLECYSTECTOMY;  Surgeon: Abigail Miyamoto, MD;  Location: [facility];  Service: General;  Laterality: N/A;   NASAL POLYP EXCISION  1960   NASAL SINUS SURGERY  03/20/2022   WISDOM TOOTH EXTRACTION      Outpatient Medications Prior to Visit  Medication Sig Dispense Refill   acetaminophen (TYLENOL) 500 MG tablet Take 500-1,000 mg by mouth every 6 (six) hours as needed (pain.).      albuterol (VENTOLIN HFA) 108 (90 Base) MCG/ACT inhaler Inhale 2 puffs into the lungs every 6 (six) hours as needed for wheezing or shortness of breath. 8 g 2   amLODipine (NORVASC) 10 MG tablet Take 1 tablet (10 mg total) by mouth daily. 90 tablet 3   atorvastatin (LIPITOR) 20 MG tablet Take 1 tablet (20 mg total) by mouth daily. 90 tablet 3   cetirizine (ZYRTEC) 10 MG tablet Take 10 mg by mouth daily.     cholestyramine (QUESTRAN) 4 g packet Take 1 packet (4 g total) by mouth once a week. 21 each 3   fluticasone (FLONASE) 50 MCG/ACT nasal spray Place 2 sprays into both nostrils daily.     guaiFENesin (MUCINEX) 600 MG 12 hr tablet Take 1 tablet (600 mg total) by mouth 2 (two) times daily. 60 tablet 2   hydrochlorothiazide (HYDRODIURIL) 25 MG tablet Take 0.5 tablets (12.5 mg total) by mouth daily. 90 tablet 2   hydrocortisone valerate ointment (WEST-CORT) 0.2 % Appy bid to affected area prn 15 g 2   lisinopril (ZESTRIL) 10 MG tablet Take 1 tablet (10 mg total) by mouth daily. 90 tablet 1   metFORMIN (GLUCOPHAGE) 1000 MG tablet Take 1 tablet (1,000 mg total) by mouth 2 (two) times daily with a meal. 180 tablet 2   pioglitazone (ACTOS) 45 MG tablet Take 1 tablet (45 mg total) by mouth daily. 90 tablet 3   prednisoLONE acetate (PRED FORTE) 1 % ophthalmic suspension Place 1 drop into both eyes daily as needed (eye irritation).     sodium chloride (OCEAN) 0.65 % SOLN nasal spray Place 1 spray into both nostrils as needed for congestion.     No facility-administered medications prior to visit.    No Known Allergies  Review of Systems As per HPI  PE:    07/28/2022    4:00 PM 07/15/2022   12:16 PM 07/15/2022   11:33 AM  Vitals with BMI  Weight 248 lbs  250 lbs  BMI 32.73  32.99  Systolic 130 140 161  Diastolic 75 80 82  Pulse 91  77     Physical Exam  Gen: Alert, well appearing.  Patient is oriented to person, place, time, and situation. AFFECT: pleasant, lucid thought and speech. CV:  RRR, no m/r/g.   LUNGS: CTA bilat, nonlabored resps, good aeration in all lung fields.   LABS:  Last CBC Lab Results  Component Value Date   WBC 12.3 (H) 07/15/2022   HGB 10.4 (L) 07/15/2022   HCT 34.8 (L) 07/15/2022   MCV 63.5 (L) 07/15/2022   MCH 19.0 (L) 07/15/2022   RDW 17.6 (H) 07/15/2022   PLT 442 (H) 07/15/2022   Lab Results  Component Value Date   IRON 187 (H) 07/09/2016   FERRITIN 260.1 07/09/2016    Last metabolic panel Lab Results  Component Value Date   GLUCOSE 141 (H) 07/15/2022   NA 140 07/15/2022  K 3.8 07/15/2022   CL 102 07/15/2022   CO2 25 07/15/2022   BUN 25 07/15/2022   CREATININE 1.39 (H) 07/15/2022   GFRNONAA 59 (L) 07/09/2022   CALCIUM 9.6 07/15/2022   PROT 6.7 07/15/2022   ALBUMIN 3.4 (L) 07/08/2022   BILITOT 1.2 07/15/2022   ALKPHOS 58 07/08/2022   AST 16 07/15/2022   ALT 21 07/15/2022   ANIONGAP 13 07/09/2022   Last hemoglobin A1c Lab Results  Component Value Date   HGBA1C 6.7 (A) 04/14/2022   HGBA1C 6.7 04/14/2022   HGBA1C 6.7 (A) 04/14/2022   HGBA1C 6.7 (A) 04/14/2022   Last thyroid functions Lab Results  Component Value Date   TSH 0.84 07/24/2017   IMPRESSION AND PLAN:  #1 community-acquired pneumonia.  He is improving gradually. Abnormal chest x-ray and chest CT scan: Plan is for repeat CT approximately early September this year.  #2 diabetes with nephropathy.  Recent hyperglycemia in the context of acute illness. Improving after getting back on Actos and metformin. Will repeat A1c in 1 month.  3.  Leukocytosis and thrombocytosis. In the setting of pneumonia illness. Repeat CBC in 1 month.  An After Visit Summary was printed and given to the patient.  FOLLOW UP: Return in about 4 weeks (around 08/25/2022) for f/u DM and pneumonia.  Signed:  Santiago Bumpers, MD           07/28/2022

## 2022-07-30 ENCOUNTER — Ambulatory Visit (INDEPENDENT_AMBULATORY_CARE_PROVIDER_SITE_OTHER): Payer: Medicare HMO

## 2022-07-30 VITALS — Wt 248.0 lb

## 2022-07-30 DIAGNOSIS — Z0000 Encounter for general adult medical examination without abnormal findings: Secondary | ICD-10-CM

## 2022-07-30 NOTE — Progress Notes (Signed)
I connected with  Zachary Chandler on 07/30/22 by a audio enabled telemedicine application and verified that I am speaking with the correct person using two identifiers.  Patient Location: Home  Provider Location: Home Office  I discussed the limitations of evaluation and management by telemedicine. The patient expressed understanding and agreed to proceed.   Subjective:   Zachary Chandler is a 72 y.o. male who presents for Medicare Annual/Subsequent preventive examination.  Visit Complete: Virtual  I connected with  Zachary Chandler on 07/30/22 by a audio enabled telemedicine application and verified that I am speaking with the correct person using two identifiers.  Patient Location: Home  Provider Location: Home Office  I discussed the limitations of evaluation and management by telemedicine. The patient expressed understanding and agreed to proceed.  Review of Systems     Cardiac Risk Factors include: advanced age (>14men, >12 women);dyslipidemia;hypertension;diabetes mellitus;male gender;obesity (BMI >30kg/m2)     Objective:    Today's Vitals   07/30/22 0905  Weight: 248 lb (112.5 kg)   Body mass index is 32.72 kg/m.     07/30/2022    9:10 AM 07/08/2022    2:39 AM 07/07/2022    9:49 PM 08/28/2021   11:30 AM 08/22/2020    2:21 PM 01/23/2020   12:30 PM 11/15/2018   12:00 PM  Advanced Directives  Does Patient Have a Medical Advance Directive? No  No Yes Yes No No  Type of Advance Directive    Healthcare Power of [employer] Power of Attorney    Does patient want to make changes to medical advance directive?     No - Patient declined    Copy of Healthcare Power of Attorney in Chart?    No - copy requested     Would patient like information on creating a medical advance directive? No - Patient declined No - Patient declined   No - Patient declined      Current Medications (verified) Outpatient Encounter Medications as of 07/30/2022  Medication Sig    acetaminophen (TYLENOL) 500 MG tablet Take 500-1,000 mg by mouth every 6 (six) hours as needed (pain.).   albuterol (VENTOLIN HFA) 108 (90 Base) MCG/ACT inhaler Inhale 2 puffs into the lungs every 6 (six) hours as needed for wheezing or shortness of breath.   amLODipine (NORVASC) 10 MG tablet Take 1 tablet (10 mg total) by mouth daily.   atorvastatin (LIPITOR) 20 MG tablet Take 1 tablet (20 mg total) by mouth daily.   cetirizine (ZYRTEC) 10 MG tablet Take 10 mg by mouth daily.   cholestyramine (QUESTRAN) 4 g packet Take 1 packet (4 g total) by mouth once a week.   fluticasone (FLONASE) 50 MCG/ACT nasal spray Place 2 sprays into both nostrils daily.   hydrochlorothiazide (HYDRODIURIL) 25 MG tablet Take 0.5 tablets (12.5 mg total) by mouth daily.   hydrocortisone valerate ointment (WEST-CORT) 0.2 % Appy bid to affected area prn   lisinopril (ZESTRIL) 10 MG tablet Take 1 tablet (10 mg total) by mouth daily.   metFORMIN (GLUCOPHAGE) 1000 MG tablet Take 1 tablet (1,000 mg total) by mouth 2 (two) times daily with a meal.   pioglitazone (ACTOS) 45 MG tablet Take 1 tablet (45 mg total) by mouth daily.   prednisoLONE acetate (PRED FORTE) 1 % ophthalmic suspension Place 1 drop into both eyes daily as needed (eye irritation).   sodium chloride (OCEAN) 0.65 % SOLN nasal spray Place 1 spray into both nostrils as needed for congestion.   [  DISCONTINUED] guaiFENesin (MUCINEX) 600 MG 12 hr tablet Take 1 tablet (600 mg total) by mouth 2 (two) times daily.   No facility-administered encounter medications on file as of 07/30/2022.    Allergies (verified) Patient has no known allergies.   History: Past Medical History:  Diagnosis Date   Carpal tunnel syndrome on left    Cataract 07/2015   R eye--surgery recommended by his eye MD 08/03/15   Chronic fatigue    Chronic renal insufficiency, stage 3 (moderate) (HCC) 2021   Cr 1.2-1.4, GFR 50s-60s   Closed fracture of greater tuberosity of Chandler humerus 02/28/2019    Immobil w/sling (Dr. Dion Chandler)   Colon cancer screening 02/15/2018   iFOB neg 02/2018,  06/2020, and 09/2021.   Community acquired pneumonia    06/2022 +sepsis--> hospitalization.  Needs follow-up CT with contrast chest in 3 months.   Erectile dysfunction    Dr. Patsi Chandler.  Testosterone normal 04/2016 at [facility].   Gout of big toe 1990s   only one episode; he cut back on peanut butter and hasn't had any prob since   Herpes simplex ophthalmicus    L eye, has opthalmologist, uses prednisone drops prn   History of gallstones    per pt report   History of kidney stones    History of prostatitis 1980s   Remote hx of seeing Dr. Vonita Chandler at [facility]--then Dr. Patsi Chandler.    Hyperlipidemia    Hypertension dx'd 08/2013   Microcytic anemia 06/2016   Nodular basal cell carcinoma (BCC) 02/19/2017   Chandler temple-[id number]. Dr. Jorja Chandler   Shingles    recurrent L thorax   Sinus disease    Thalassemia    Mild anemia with MCV 60s (alpha thal minor or beta thal trait: Hb electrophoresis not done/no result available.).   Type 2 diabetes mellitus with microalbuminuria (HCC) Dx'd 08/2013   Past Surgical History:  Procedure Laterality Date   CHOLECYSTECTOMY N/A 01/26/2020   Procedure: LAPAROSCOPIC CHOLECYSTECTOMY;  Surgeon: Zachary Miyamoto, MD;  Location: [facility];  Service: General;  Laterality: N/A;   NASAL POLYP EXCISION  1960   NASAL SINUS SURGERY  03/20/2022   WISDOM TOOTH EXTRACTION     Family History  Problem Relation Age of Onset   Heart disease Mother    Hypertension Mother    Diabetes Mother    Heart disease Father    Heart attack Sister    Social History   Socioeconomic History   Marital status: Married    Spouse name: Not on file   Number of children: Not on file   Years of education: Not on file   Highest education level: Some college, no degree  Occupational History   Not on file  Tobacco Use   Smoking status: Former    Types: Cigarettes    Quit date: 02/28/1970    Years  since quitting: 52.4   Smokeless tobacco: Never  Vaping Use   Vaping Use: Never used  Substance and Sexual Activity   Alcohol use: No    Alcohol/week: 0.0 standard drinks of alcohol   Drug use: No   Sexual activity: Not on file  Other Topics Concern   Not on file  Social History Narrative   Married, has one 9 y/o son.   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     Orig from [place].   Was in [employer] [phone].  No combat duty.  No VA care.   Smoked 10-12 yrs, quit in his early 22s.  Alcohol: none   No hx of drugs.            Social Determinants of Health   Financial Resource Strain: Low Risk  (07/30/2022)   Overall Financial Resource Strain (CARDIA)    Difficulty of Paying Living Expenses: Not hard at all  Food Insecurity: No Food Insecurity (07/30/2022)   Hunger Vital Sign    Worried About Running Out of Food in the Last Year: Never true    Ran Out of Food in the Last Year: Never true  Transportation Needs: No Transportation Needs (07/30/2022)   PRAPARE - Administrator, Civil Service (Medical): No    Lack of Transportation (Non-Medical): No  Physical Activity: Inactive (07/30/2022)   Exercise Vital Sign    Days of Exercise per Week: 0 days    Minutes of Exercise per Session: 0 min  Stress: No Stress Concern Present (07/30/2022)   [employer] of Occupational Health - Occupational Stress Questionnaire    Feeling of Stress : Not at all  Social Connections: Moderately Integrated (07/30/2022)   Social Connection and Isolation Panel [NHANES]    Frequency of Communication with Friends and Family: Once a week    Frequency of Social Gatherings with Friends and Family: Three times a week    Attends Religious Services: More than 4 times per year    Active Member of Clubs or Organizations: No    Attends Banker Meetings: Never    Marital Status: Married    Tobacco Counseling Counseling given: Not Answered   Clinical  Intake:  Pre-visit preparation completed: Yes  Pain : No/denies pain     BMI - recorded: 32.72 Nutritional Status: BMI > 30  Obese Nutritional Risks: None Diabetes: Yes CBG done?: No Did pt. bring in CBG monitor from home?: No  How often do you need to have someone help you when you read instructions, pamphlets, or other written materials from your doctor or pharmacy?: 1 - Never  Interpreter Needed?: No  Information entered by :: Lanier Ensign, LPN   Activities of Daily Living    07/30/2022    9:11 AM 07/08/2022    2:19 AM  In your present state of health, do you have any difficulty performing the following activities:  Hearing? 0 0  Vision? 0 0  Difficulty concentrating or making decisions? 0 0  Walking or climbing stairs? 0 0  Dressing or bathing? 0 0  Doing errands, shopping? 0 0  Preparing Food and eating ? N   Using the Toilet? N   In the past six months, have you accidently leaked urine? Y   Do you have problems with loss of bowel control? N   Managing your Medications? N   Managing your Finances? N   Housekeeping or managing your Housekeeping? N     Patient Care Team: Jeoffrey Massed, MD as PCP - General (Family Medicine) Jethro Bolus, MD (Inactive) as Consulting Physician (Urology) Teryl Lucy, MD as Consulting Physician (Orthopedic Surgery) Zachary Miyamoto, MD as Consulting Physician (General Surgery) Janalyn Harder, MD (Inactive) as Consulting Physician (Dermatology) Delora Fuel, OD (Optometry)  Indicate any recent Medical Services you may have received from other than [facility] providers in the past year (date may be approximate).     Assessment:   This is a routine wellness examination for Riad.  Hearing/Vision screen Hearing Screening - Comments:: Pt denies any hearing issues  Vision Screening - Comments:: Pt follows up with Dr Laural Benes for annual eye exams  Dietary issues and exercise activities discussed:     Goals Addressed              This Visit's Progress    Patient Stated       Get more active        Depression Screen    07/30/2022    9:09 AM 01/09/2022    8:14 AM 10/08/2021    9:14 AM 08/28/2021   11:29 AM 06/26/2021    8:47 AM 12/06/2020    9:04 AM 09/06/2020    8:29 AM  PHQ 2/9 Scores  PHQ - 2 Score 0 0 0 0 0 0 0    Fall Risk    07/30/2022    9:11 AM 01/09/2022    8:14 AM 10/08/2021    9:14 AM 08/28/2021   11:31 AM 06/26/2021    8:48 AM  Fall Risk   Falls in the past year? 0 0 0 0 0  Number falls in past yr: 0 0 0 0 0  Injury with Fall? 0 0 0 0 0  Risk for fall due to : Impaired balance/gait;Impaired vision No Fall Risks Impaired vision Impaired balance/gait;Impaired vision Impaired vision  Follow up Falls prevention discussed Falls evaluation completed Falls evaluation completed Falls prevention discussed Falls evaluation completed    MEDICARE RISK AT HOME:  Medicare Risk at Home - 07/30/22 0912     Any stairs in or around the home? Yes    If so, are there any without handrails? No    Home free of loose throw rugs in walkways, pet beds, electrical cords, etc? Yes    Adequate lighting in your home to reduce risk of falls? Yes    Life alert? No    Use of a cane, walker or w/c? No    Grab bars in the bathroom? Yes    Shower chair or bench in shower? No    Elevated toilet seat or a handicapped toilet? No             TIMED UP AND GO:  Was the test performed?  No    Cognitive Function:        07/30/2022    9:12 AM 08/28/2021   11:33 AM  6CIT Screen  What Year? 0 points 0 points  What month? 0 points 0 points  What time? 0 points 0 points  Count back from 20 0 points 0 points  Months in reverse 0 points 0 points  Repeat phrase 0 points 0 points  Total Score 0 points 0 points    Immunizations Immunization History  Administered Date(s) Administered   PFIZER Comirnaty(Gray Top)Covid-19 Tri-Sucrose Vaccine 08/06/2020   PFIZER(Purple Top)SARS-COV-2 Vaccination  11/01/2019, 11/22/2019   Pneumococcal Conjugate-13 09/20/2013   Pneumococcal Polysaccharide-23 01/08/2015   Tdap 01/07/2016    TDAP status: Up to date  Flu Vaccine status: Declined, Education has been provided regarding the importance of this vaccine but patient still declined. Advised may receive this vaccine at local pharmacy or Health Dept. Aware to provide a copy of the vaccination record if obtained from local pharmacy or Health Dept. Verbalized acceptance and understanding.  Pneumococcal vaccine status: Up to date  Covid-19 vaccine status: Completed vaccines  Qualifies for Shingles Vaccine? Yes   Zostavax completed No   Shingrix Completed?: No.    Education has been provided regarding the importance of this vaccine. Patient has been advised to call insurance company to determine out of pocket expense if they have not yet received this  vaccine. Advised may also receive vaccine at local pharmacy or Health Dept. Verbalized acceptance and understanding.  Screening Tests Health Maintenance  Topic Date Due   COVID-19 Vaccine (4 - 2023-24 season) 10/11/2021   Diabetic kidney evaluation - Urine ACR  06/27/2022   FOOT EXAM  06/27/2022   Zoster Vaccines- Shingrix (1 of 2) 10/30/2022 (Originally 05/14/1969)   INFLUENZA VACCINE  09/11/2022   OPHTHALMOLOGY EXAM  10/12/2022   HEMOGLOBIN A1C  10/15/2022   Diabetic kidney evaluation - eGFR measurement  07/15/2023   Medicare Annual Wellness (AWV)  07/30/2023   DTaP/Tdap/Td (2 - Td or Tdap) 01/06/2026   Colonoscopy  07/09/2026   Pneumonia Vaccine 43+ Years old  Completed   Hepatitis C Screening  Completed   HPV VACCINES  Aged Out    Health Maintenance  Health Maintenance Due  Topic Date Due   COVID-19 Vaccine (4 - 2023-24 season) 10/11/2021   Diabetic kidney evaluation - Urine ACR  06/27/2022   FOOT EXAM  06/27/2022    Colorectal cancer screening: Type of screening: Colonoscopy. Completed 07/08/16. Repeat every 10 years  Additional  Screening:  Hepatitis C Screening: Completed 01/07/16  Vision Screening: Recommended annual ophthalmology exams for early detection of glaucoma and other disorders of the eye. Is the patient up to date with their annual eye exam?  Yes  Who is the provider or what is the name of the office in which the patient attends annual eye exams? Dr Laural Benes  If pt is not established with a provider, would they like to be referred to a provider to establish care? No .   Dental Screening: Recommended annual dental exams for proper oral hygiene  Diabetic Foot Exam: Diabetic Foot Exam: Overdue, Pt has been advised about the importance in completing this exam. Pt is scheduled for diabetic foot exam on next appt .  Community Resource Referral / Chronic Care Management: CRR required this visit?  No   CCM required this visit?  No     Plan:     I have personally reviewed and noted the following in the patient's chart:   Medical and social history Use of alcohol, tobacco or illicit drugs  Current medications and supplements including opioid prescriptions. Patient is not currently taking opioid prescriptions. Functional ability and status Nutritional status Physical activity Advanced directives List of other physicians Hospitalizations, surgeries, and ER visits in previous 12 months Vitals Screenings to include cognitive, depression, and falls Referrals and appointments  In addition, I have reviewed and discussed with patient certain preventive protocols, quality metrics, and best practice recommendations. A written personalized care plan for preventive services as well as general preventive health recommendations were provided to patient.     Marzella Schlein, LPN   1/61/0960   After Visit Summary: (MyChart) Due to this being a telephonic visit, the after visit summary with patients personalized plan was offered to patient via MyChart   Nurse Notes: none

## 2022-07-30 NOTE — Patient Instructions (Signed)
Zachary Chandler , Thank you for taking time to come for your Medicare Wellness Visit. I appreciate your ongoing commitment to your health goals. Please review the following plan we discussed and let me know if I can assist you in the future.   These are the goals we discussed:  Goals       patient (pt-stated)      Maintain current health.       Patient Stated      Maintain current health      Patient Stated      None at this time       Patient Stated      Get more active         This is a list of the screening recommended for you and due dates:  Health Maintenance  Topic Date Due   Zoster (Shingles) Vaccine (1 of 2) Never done   COVID-19 Vaccine (4 - 2023-24 season) 10/11/2021   Yearly kidney health urinalysis for diabetes  06/27/2022   Complete foot exam   06/27/2022   Flu Shot  09/11/2022   Eye exam for diabetics  10/12/2022   Hemoglobin A1C  10/15/2022   Yearly kidney function blood test for diabetes  07/15/2023   Medicare Annual Wellness Visit  07/30/2023   DTaP/Tdap/Td vaccine (2 - Td or Tdap) 01/06/2026   Colon Cancer Screening  07/09/2026   Pneumonia Vaccine  Completed   Hepatitis C Screening  Completed   HPV Vaccine  Aged Out    Advanced directives: Advance directive discussed with you today. Even though you declined this today please call our office should you change your mind and we can give you the proper paperwork for you to fill out.  Conditions/risks identified: get more active   Next appointment: Follow up in one year for your annual wellness visit.   Preventive Care 73 Years and Older, Male  Preventive care refers to lifestyle choices and visits with your health care provider that can promote health and wellness. What does preventive care include? A yearly physical exam. This is also called an annual well check. Dental exams once or twice a year. Routine eye exams. Ask your health care provider how often you should have your eyes checked. Personal  lifestyle choices, including: Daily care of your teeth and gums. Regular physical activity. Eating a healthy diet. Avoiding tobacco and drug use. Limiting alcohol use. Practicing safe sex. Taking low doses of aspirin every day. Taking vitamin and mineral supplements as recommended by your health care provider. What happens during an annual well check? The services and screenings done by your health care provider during your annual well check will depend on your age, overall health, lifestyle risk factors, and family history of disease. Counseling  Your health care provider may ask you questions about your: Alcohol use. Tobacco use. Drug use. Emotional well-being. Home and relationship well-being. Sexual activity. Eating habits. History of falls. Memory and ability to understand (cognition). Work and work Astronomer. Screening  You may have the following tests or measurements: Height, weight, and BMI. Blood pressure. Lipid and cholesterol levels. These may be checked every 5 years, or more frequently if you are over 54 years old. Skin check. Lung cancer screening. You may have this screening every year starting at age 27 if you have a 30-pack-year history of smoking and currently smoke or have quit within the past 15 years. Fecal occult blood test (FOBT) of the stool. You may have this test every year  starting at age 75. Flexible sigmoidoscopy or colonoscopy. You may have a sigmoidoscopy every 5 years or a colonoscopy every 10 years starting at age 42. Prostate cancer screening. Recommendations will vary depending on your family history and other risks. Hepatitis C blood test. Hepatitis B blood test. Sexually transmitted disease (STD) testing. Diabetes screening. This is done by checking your blood sugar (glucose) after you have not eaten for a while (fasting). You may have this done every 1-3 years. Abdominal aortic aneurysm (AAA) screening. You may need this if you are a  current or former smoker. Osteoporosis. You may be screened starting at age 38 if you are at high risk. Talk with your health care provider about your test results, treatment options, and if necessary, the need for more tests. Vaccines  Your health care provider may recommend certain vaccines, such as: Influenza vaccine. This is recommended every year. Tetanus, diphtheria, and acellular pertussis (Tdap, Td) vaccine. You may need a Td booster every 10 years. Zoster vaccine. You may need this after age 77. Pneumococcal 13-valent conjugate (PCV13) vaccine. One dose is recommended after age 62. Pneumococcal polysaccharide (PPSV23) vaccine. One dose is recommended after age 17. Talk to your health care provider about which screenings and vaccines you need and how often you need them. This information is not intended to replace advice given to you by your health care provider. Make sure you discuss any questions you have with your health care provider. Document Released: 02/23/2015 Document Revised: 10/17/2015 Document Reviewed: 11/28/2014 Elsevier Interactive Patient Education  2017 [employer].  Fall Prevention in the Home Falls can cause injuries. They can happen to people of all ages. There are many things you can do to make your home safe and to help prevent falls. What can I do on the outside of my home? Regularly fix the edges of walkways and driveways and fix any cracks. Remove anything that might make you trip as you walk through a door, such as a raised step or threshold. Trim any bushes or trees on the path to your home. Use bright outdoor lighting. Clear any walking paths of anything that might make someone trip, such as rocks or tools. Regularly check to see if handrails are loose or broken. Make sure that both sides of any steps have handrails. Any raised decks and porches should have guardrails on the edges. Have any leaves, snow, or ice cleared regularly. Use sand or salt on  walking paths during winter. Clean up any spills in your garage right away. This includes oil or grease spills. What can I do in the bathroom? Use night lights. Install grab bars by the toilet and in the tub and shower. Do not use towel bars as grab bars. Use non-skid mats or decals in the tub or shower. If you need to sit down in the shower, use a plastic, non-slip stool. Keep the floor dry. Clean up any water that spills on the floor as soon as it happens. Remove soap buildup in the tub or shower regularly. Attach bath mats securely with double-sided non-slip rug tape. Do not have throw rugs and other things on the floor that can make you trip. What can I do in the bedroom? Use night lights. Make sure that you have a light by your bed that is easy to reach. Do not use any sheets or blankets that are too big for your bed. They should not hang down onto the floor. Have a firm chair that has side arms.  You can use this for support while you get dressed. Do not have throw rugs and other things on the floor that can make you trip. What can I do in the kitchen? Clean up any spills right away. Avoid walking on wet floors. Keep items that you use a lot in easy-to-reach places. If you need to reach something above you, use a strong step stool that has a grab bar. Keep electrical cords out of the way. Do not use floor polish or wax that makes floors slippery. If you must use wax, use non-skid floor wax. Do not have throw rugs and other things on the floor that can make you trip. What can I do with my stairs? Do not leave any items on the stairs. Make sure that there are handrails on both sides of the stairs and use them. Fix handrails that are broken or loose. Make sure that handrails are as long as the stairways. Check any carpeting to make sure that it is firmly attached to the stairs. Fix any carpet that is loose or worn. Avoid having throw rugs at the top or bottom of the stairs. If you do  have throw rugs, attach them to the floor with carpet tape. Make sure that you have a light switch at the top of the stairs and the bottom of the stairs. If you do not have them, ask someone to add them for you. What else can I do to help prevent falls? Wear shoes that: Do not have high heels. Have rubber bottoms. Are comfortable and fit you well. Are closed at the toe. Do not wear sandals. If you use a stepladder: Make sure that it is fully opened. Do not climb a closed stepladder. Make sure that both sides of the stepladder are locked into place. Ask someone to hold it for you, if possible. Clearly mark and make sure that you can see: Any grab bars or handrails. First and last steps. Where the edge of each step is. Use tools that help you move around (mobility aids) if they are needed. These include: Canes. Walkers. Scooters. Crutches. Turn on the lights when you go into a dark area. Replace any light bulbs as soon as they burn out. Set up your furniture so you have a clear path. Avoid moving your furniture around. If any of your floors are uneven, fix them. If there are any pets around you, be aware of where they are. Review your medicines with your doctor. Some medicines can make you feel dizzy. This can increase your chance of falling. Ask your doctor what other things that you can do to help prevent falls. This information is not intended to replace advice given to you by your health care provider. Make sure you discuss any questions you have with your health care provider. Document Released: 11/23/2008 Document Revised: 07/05/2015 Document Reviewed: 03/03/2014 Elsevier Interactive Patient Education  2017 [employer].

## 2022-07-31 ENCOUNTER — Ambulatory Visit: Payer: Self-pay

## 2022-07-31 NOTE — Patient Instructions (Signed)
Visit Information  Thank you for taking time to visit with me today. Please don't hesitate to contact me if I can be of assistance to you.   Following are the goals we discussed today:   Goals Addressed             This Visit's Progress    Recent hospitalization pneumonia       Patient Goals/Self Care Activities: -Patient/Caregiver will self-administer medications as prescribed as evidenced by self-report/primary caregiver report  -Patient/Caregiver will attend all scheduled provider appointments as evidenced by clinician review of documented attendance to scheduled appointments and patient/caregiver report -Patient/Caregiver will call provider office for new concerns or questions as evidenced by review of documented incoming telephone call notes and patient report  -check blood sugar at prescribed times -check blood sugar if I feel it is too high or too low -record values and write them down take them to all doctor visits    Patient doing better.  Some cough but much better.  Discussed pneumonia and recovery.  He is back on his diabetes medications and last sugar 139.  PCP appointment went well and yesterday and will return in one month.         Our next appointment is by telephone on 09/01/22 at 1000 am  Please call the care guide team at [phone] if you need to cancel or reschedule your appointment.   If you are experiencing a Mental Health or Behavioral Health Crisis or need someone to talk to, please call the Suicide and Crisis Lifeline: 988   Patient verbalizes understanding of instructions and care plan provided today and agrees to view in MyChart. Active MyChart status and patient understanding of how to access instructions and care plan via MyChart confirmed with patient.     The patient has been provided with contact information for the care management team and has been advised to call with any health related questions or concerns.   Bary Leriche, RN, MSN [facility]  Care Management Care Management Coordinator Direct Line [phone]

## 2022-07-31 NOTE — Patient Outreach (Signed)
  Care Coordination   Initial Visit Note   07/31/2022 Name: Zachary Chandler MRN: [id number] DOB: 03/21/1950  Zachary Chandler is a 72 y.o. year old male who sees McGowen, Maryjean Morn, MD for primary care. I spoke with  Zachary Chandler by phone today.  What matters to the patients health and wellness today?  Pneumonia recovery    Goals Addressed             This Visit's Progress    Recent hospitalization pneumonia       Patient Goals/Self Care Activities: -Patient/Caregiver will self-administer medications as prescribed as evidenced by self-report/primary caregiver report  -Patient/Caregiver will attend all scheduled provider appointments as evidenced by clinician review of documented attendance to scheduled appointments and patient/caregiver report -Patient/Caregiver will call provider office for new concerns or questions as evidenced by review of documented incoming telephone call notes and patient report  -check blood sugar at prescribed times -check blood sugar if I feel it is too high or too low -record values and write them down take them to all doctor visits    Patient doing better.  Some cough but much better.  Discussed pneumonia and recovery.  He is back on his diabetes medications and last sugar 139.  PCP appointment went well and yesterday and will return in one month.         SDOH assessments and interventions completed:  Yes  SDOH Interventions Today    Flowsheet Row Most Recent Value  SDOH Interventions   Utilities Interventions Intervention Not Indicated        Care Coordination Interventions:  Yes, provided   Follow up plan: Follow up call scheduled for July    Encounter Outcome:  Pt. Visit Completed   Bary Leriche, RN, MSN [facility] Care Management Care Management Coordinator Direct Line [phone]

## 2022-08-18 DIAGNOSIS — Z9889 Other specified postprocedural states: Secondary | ICD-10-CM | POA: Diagnosis not present

## 2022-08-18 DIAGNOSIS — J3489 Other specified disorders of nose and nasal sinuses: Secondary | ICD-10-CM | POA: Diagnosis not present

## 2022-08-25 NOTE — Patient Instructions (Signed)

## 2022-08-28 ENCOUNTER — Ambulatory Visit (INDEPENDENT_AMBULATORY_CARE_PROVIDER_SITE_OTHER): Payer: Medicare HMO | Admitting: Family Medicine

## 2022-08-28 ENCOUNTER — Encounter: Payer: Self-pay | Admitting: Family Medicine

## 2022-08-28 VITALS — BP 132/60 | HR 88 | Wt 253.4 lb

## 2022-08-28 DIAGNOSIS — D75839 Thrombocytosis, unspecified: Secondary | ICD-10-CM | POA: Diagnosis not present

## 2022-08-28 DIAGNOSIS — J189 Pneumonia, unspecified organism: Secondary | ICD-10-CM | POA: Diagnosis not present

## 2022-08-28 DIAGNOSIS — E1121 Type 2 diabetes mellitus with diabetic nephropathy: Secondary | ICD-10-CM

## 2022-08-28 DIAGNOSIS — D72829 Elevated white blood cell count, unspecified: Secondary | ICD-10-CM

## 2022-08-28 LAB — MICROALBUMIN / CREATININE URINE RATIO
Creatinine,U: 86.7 mg/dL
Microalb Creat Ratio: 1.8 mg/g (ref 0.0–30.0)
Microalb, Ur: 1.5 mg/dL (ref 0.0–1.9)

## 2022-08-28 LAB — POCT GLYCOSYLATED HEMOGLOBIN (HGB A1C)
HbA1c POC (<> result, manual entry): 6.4 % (ref 4.0–5.6)
HbA1c, POC (controlled diabetic range): 6.4 % (ref 0.0–7.0)
HbA1c, POC (prediabetic range): 6.4 % (ref 5.7–6.4)
Hemoglobin A1C: 6.4 % — AB (ref 4.0–5.6)

## 2022-08-28 MED ORDER — HYDROCORTISONE VALERATE 0.2 % EX OINT: TOPICAL_OINTMENT | CUTANEOUS | 2 refills | Status: AC

## 2022-08-28 NOTE — Addendum Note (Signed)
Addended by: Emi Holes D on: 08/28/2022 09:41 AM   Modules accepted: Orders

## 2022-08-28 NOTE — Progress Notes (Signed)
OFFICE VISIT  08/28/2022  CC:  Chief Complaint  Patient presents with   Diabetes    F/u.    Pneumonia    F/u.     Patient is a 72 y.o. male who presents for 1 month follow-up pneumonia, diabetes with nephropathy and recent hyperglycemia, and follow-up leukocytosis and thrombocytosis in the setting of pneumonia illness.  INTERIM HX: Zachary Chandler is feeling better.  Energy level increasing. He has improved his diet.  Home glucoses around 130 since I last saw him.  His ENT MD started Astelin yesterday.  [name] feels like he still gets some intermittent nasal congestion, has a chronic postnasal drip and mild coughing fits at times.  He feels like it is an upper airway cough.  Interestingly, he has started eating some yogurt every other day and this has controlled his postprandial diarrhea so well that he no longer has to use cholestyramine.  ROS as above, plus--> no fevers, no CP, no SOB, no wheezing, no cough, no dizziness, no HAs, no rashes, no melena/hematochezia.  No polyuria or polydipsia.  No myalgias or arthralgias.  No focal weakness, paresthesias, or tremors.  No acute vision or hearing abnormalities.  No dysuria or unusual/new urinary urgency or frequency.  No recent changes in lower legs. No n/v/d or abd pain.  No palpitations.      Past Medical History:  Diagnosis Date   Carpal tunnel syndrome on left    Cataract 07/2015   R eye--surgery recommended by his eye MD 08/03/15   Chronic fatigue    Chronic renal insufficiency, stage 3 (moderate) (HCC) 2021   Cr 1.2-1.4, GFR 50s-60s   Closed fracture of greater tuberosity of right humerus 02/28/2019   Immobil w/sling (Dr. Dion Saucier)   Colon cancer screening 02/15/2018   iFOB neg 02/2018,  06/2020, and 09/2021.   Community acquired pneumonia    06/2022 +sepsis--> hospitalization.  Needs follow-up CT with contrast chest in 3 months.   Erectile dysfunction    Dr. Patsi Sears.  Testosterone [facility] 04/2016 at [facility].   Gout of big toe 1990s    only one episode; he cut back on peanut butter and hasn't had any prob since   Herpes simplex ophthalmicus    L eye, has opthalmologist, uses prednisone drops prn   History of gallstones    per pt report   History of kidney stones    History of prostatitis 1980s   Remote hx of seeing Dr. Vonita Moss at [facility]--then Dr. Patsi Sears.    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 Dr. Jorja Loa   Shingles    recurrent L thorax   Sinus disease    Thalassemia    Mild anemia with MCV 60s (alpha thal minor or beta thal trait: Hb electrophoresis not done/no result available.).   Type 2 diabetes mellitus with microalbuminuria (HCC) Dx'd 08/2013    Past Surgical History:  Procedure Laterality Date   CHOLECYSTECTOMY N/A 01/26/2020   Procedure: LAPAROSCOPIC CHOLECYSTECTOMY;  Surgeon: Abigail Miyamoto, MD;  Location: [facility];  Service: General;  Laterality: N/A;   NASAL POLYP EXCISION  1960   NASAL SINUS SURGERY  03/20/2022   WISDOM TOOTH EXTRACTION      Outpatient Medications Prior to Visit  Medication Sig Dispense Refill   acetaminophen (TYLENOL) 500 MG tablet Take 500-1,000 mg by mouth every 6 (six) hours as needed (pain.).     albuterol (VENTOLIN HFA) 108 (  90 Base) MCG/ACT inhaler Inhale 2 puffs into the lungs every 6 (six) hours as needed for wheezing or shortness of breath. 8 g 2   amLODipine (NORVASC) 10 MG tablet Take 1 tablet (10 mg total) by mouth daily. 90 tablet 3   atorvastatin (LIPITOR) 20 MG tablet Take 1 tablet (20 mg total) by mouth daily. 90 tablet 3   azelastine (ASTELIN) 0.1 % nasal spray Place 2 sprays into both nostrils 2 (two) times daily. Use in each nostril as directed     cetirizine (ZYRTEC) 10 MG tablet Take 10 mg by mouth daily.     fluticasone (FLONASE) 50 MCG/ACT nasal spray Place 2 sprays into both nostrils daily.     hydrochlorothiazide (HYDRODIURIL) 25 MG  tablet Take 0.5 tablets (12.5 mg total) by mouth daily. 90 tablet 2   lisinopril (ZESTRIL) 10 MG tablet Take 1 tablet (10 mg total) by mouth daily. 90 tablet 1   metFORMIN (GLUCOPHAGE) 1000 MG tablet Take 1 tablet (1,000 mg total) by mouth 2 (two) times daily with a meal. 180 tablet 2   pioglitazone (ACTOS) 45 MG tablet Take 1 tablet (45 mg total) by mouth daily. 90 tablet 3   prednisoLONE acetate (PRED FORTE) 1 % ophthalmic suspension Place 1 drop into both eyes daily as needed (eye irritation).     sodium chloride (OCEAN) 0.65 % SOLN nasal spray Place 1 spray into both nostrils as needed for congestion.     cholestyramine (QUESTRAN) 4 g packet Take 1 packet (4 g total) by mouth once a week. 21 each 3   hydrocortisone valerate ointment (WEST-CORT) 0.2 % Appy bid to affected area prn 15 g 2   No facility-administered medications prior to visit.    No Known Allergies  Review of Systems As per HPI  PE:    08/28/2022    8:57 AM 08/28/2022    8:48 AM 07/30/2022    9:05 AM  Vitals with BMI  Weight  253 lbs 6 oz 248 lbs  BMI   32.73  Systolic 132 143   Diastolic 60 79   Pulse  88      Physical Exam  Gen: Alert, well appearing.  Patient is oriented to person, place, time, and situation. AFFECT: pleasant, lucid thought and speech. Foot exam -  no swelling, tenderness or skin or vascular lesions. Color and temperature is normal. Sensation is intact. Peripheral pulses are palpable. Toenails are normal.   LABS:  Last CBC Lab Results  Component Value Date   WBC 12.3 (H) 07/15/2022   HGB 10.4 (L) 07/15/2022   HCT 34.8 (L) 07/15/2022   MCV 63.5 (L) 07/15/2022   MCH 19.0 (L) 07/15/2022   RDW 17.6 (H) 07/15/2022   PLT 442 (H) 07/15/2022   Lab Results  Component Value Date   IRON 187 (H) 07/09/2016   FERRITIN 260.1 07/09/2016   Last metabolic panel Lab Results  Component Value Date   GLUCOSE 141 (H) 07/15/2022   NA 140 07/15/2022   K 3.8 07/15/2022   CL 102 07/15/2022   CO2  25 07/15/2022   BUN 25 07/15/2022   CREATININE 1.39 (H) 07/15/2022   GFRNONAA 59 (L) 07/09/2022   CALCIUM 9.6 07/15/2022   PROT 6.7 07/15/2022   ALBUMIN 3.4 (L) 07/08/2022   BILITOT 1.2 07/15/2022   ALKPHOS 58 07/08/2022   AST 16 07/15/2022   ALT 21 07/15/2022   ANIONGAP 13 07/09/2022   Lab Results  Component Value Date   HGBA1C 6.4 (A)  08/28/2022   HGBA1C 6.4 08/28/2022   HGBA1C 6.4 08/28/2022   HGBA1C 6.4 08/28/2022   IMPRESSION AND PLAN:  #1 community-acquired pneumonia--completely resolved. Had leukocytosis and thrombocytosis in the midst of the illness so I will recheck CBC today. Of note, CT chest w/contrast 07/07/22: IMPRESSION: 1. Right lower and upper lobe peribronchovascular patchy airspace opacities. Findings suggestive of infection. Recommend follow-up CT in 3 months to evaluate for complete resolution. 2. Enlarged subcarinal lymph node and prominent mediastinal lymph nodes likely reactive in etiology. I did a chest x-ray on 07/16/2022 that showed no active cardiopulmonary disease.  It showed that the previously visualized faint patchy right midlung opacities appear resolved. Given his appropriate clinical recovery and this reassuring chest x-ray I will hold off on repeating the CT scan.  #2 diabetes with nephropathy, good control. POC Hba1c today is 6.4%. Normal feet exam today. Urine microalbumin/creatinine today.  An After Visit Summary was printed and given to the patient.  FOLLOW UP: Return in about 3 months (around 11/28/2022) for annual CPE (fasting).  Signed:  Santiago Bumpers, MD           08/28/2022

## 2022-09-01 ENCOUNTER — Ambulatory Visit: Payer: Self-pay

## 2022-09-01 ENCOUNTER — Other Ambulatory Visit (INDEPENDENT_AMBULATORY_CARE_PROVIDER_SITE_OTHER): Payer: Medicare HMO

## 2022-09-01 DIAGNOSIS — D75839 Thrombocytosis, unspecified: Secondary | ICD-10-CM

## 2022-09-01 DIAGNOSIS — D72829 Elevated white blood cell count, unspecified: Secondary | ICD-10-CM

## 2022-09-01 NOTE — Patient Outreach (Signed)
  Care Coordination   Follow Up Visit Note   09/01/2022 Name: Zachary Chandler MRN: [id number] DOB: 1950-04-20  Zachary Chandler is a 72 y.o. year old male who sees McGowen, Maryjean Morn, MD for primary care. I spoke with  Zachary Chandler by phone today.  What matters to the patients health and wellness today?  none    Goals Addressed             This Visit's Progress    COMPLETED: Recent hospitalization pneumonia       Patient Goals/Self Care Activities: -Patient/Caregiver will self-administer medications as prescribed as evidenced by self-report/primary caregiver report  -Patient/Caregiver will attend all scheduled provider appointments as evidenced by clinician review of documented attendance to scheduled appointments and patient/caregiver report -Patient/Caregiver will call provider office for new concerns or questions as evidenced by review of documented incoming telephone call notes and patient report  -check blood sugar at prescribed times -check blood sugar if I feel it is too high or too low -record values and write them down take them to all doctor visits    Patient doing better.  Some cough at night.  Diabetes management doing well. Last A1c 6.3. Encouraged continued diabetes management. Patient verbalized understanding.  No concerns.         SDOH assessments and interventions completed:  Yes     Care Coordination Interventions:  Yes, provided   Follow up plan: No further intervention required.   Encounter Outcome:  Pt. Visit Completed   Bary Leriche, RN, MSN [facility] Care Management Care Management Coordinator Direct Line [phone]

## 2022-09-01 NOTE — Patient Instructions (Signed)
Visit Information  Thank you for taking time to visit with me today. Please don't hesitate to contact me if I can be of assistance to you.   Following are the goals we discussed today:   Goals Addressed             This Visit's Progress    COMPLETED: Recent hospitalization pneumonia       Patient Goals/Self Care Activities: -Patient/Caregiver will self-administer medications as prescribed as evidenced by self-report/primary caregiver report  -Patient/Caregiver will attend all scheduled provider appointments as evidenced by clinician review of documented attendance to scheduled appointments and patient/caregiver report -Patient/Caregiver will call provider office for new concerns or questions as evidenced by review of documented incoming telephone call notes and patient report  -check blood sugar at prescribed times -check blood sugar if I feel it is too high or too low -record values and write them down take them to all doctor visits    Patient doing better.  Some cough at night.  Diabetes management doing well. Last A1c 6.3. Encouraged continued diabetes management. Patient verbalized understanding.  No concerns.            If you are experiencing a Mental Health or Behavioral Health Crisis or need someone to talk to, please call the Suicide and Crisis Lifeline: 988   Patient verbalizes understanding of instructions and care plan provided today and agrees to view in MyChart. Active MyChart status and patient understanding of how to access instructions and care plan via MyChart confirmed with patient.     The patient has been provided with contact information for the care management team and has been advised to call with any health related questions or concerns.   Bary Leriche, RN, MSN [facility] Care Management Care Management Coordinator Direct Line [phone]

## 2022-09-01 NOTE — Progress Notes (Signed)
Pt came for labs only, tolerated draw well.   

## 2022-09-02 LAB — CBC WITH DIFFERENTIAL/PLATELET
Absolute Monocytes: 663 cells/uL (ref 200–950)
Basophils Absolute: 47 cells/uL (ref 0–200)
Basophils Relative: 0.7 %
Eosinophils Absolute: 127 cells/uL (ref 15–500)
Eosinophils Relative: 1.9 %
HCT: 33.5 % — ABNORMAL LOW (ref 38.5–50.0)
Hemoglobin: 10 g/dL — ABNORMAL LOW (ref 13.2–17.1)
Lymphs Abs: 1273 cells/uL (ref 850–3900)
MCH: 19.1 pg — ABNORMAL LOW (ref 27.0–33.0)
MCHC: 29.9 g/dL — ABNORMAL LOW (ref 32.0–36.0)
MCV: 64.1 fL — ABNORMAL LOW (ref 80.0–100.0)
Monocytes Relative: 9.9 %
Neutro Abs: 4590 cells/uL (ref 1500–7800)
Neutrophils Relative %: 68.5 %
Platelets: 271 10*3/uL (ref 140–400)
RBC: 5.23 10*6/uL (ref 4.20–5.80)
RDW: 17.1 % — ABNORMAL HIGH (ref 11.0–15.0)
Total Lymphocyte: 19 %
WBC: 6.7 10*3/uL (ref 3.8–10.8)

## 2022-09-24 DIAGNOSIS — M5431 Sciatica, right side: Secondary | ICD-10-CM | POA: Diagnosis not present

## 2022-10-29 DIAGNOSIS — H524 Presbyopia: Secondary | ICD-10-CM | POA: Diagnosis not present

## 2022-10-29 DIAGNOSIS — H52223 Regular astigmatism, bilateral: Secondary | ICD-10-CM | POA: Diagnosis not present

## 2022-10-29 DIAGNOSIS — Z961 Presence of intraocular lens: Secondary | ICD-10-CM | POA: Diagnosis not present

## 2022-10-29 DIAGNOSIS — H35363 Drusen (degenerative) of macula, bilateral: Secondary | ICD-10-CM | POA: Diagnosis not present

## 2022-10-29 DIAGNOSIS — H01004 Unspecified blepharitis left upper eyelid: Secondary | ICD-10-CM | POA: Diagnosis not present

## 2022-10-29 DIAGNOSIS — H5203 Hypermetropia, bilateral: Secondary | ICD-10-CM | POA: Diagnosis not present

## 2022-10-29 DIAGNOSIS — H01001 Unspecified blepharitis right upper eyelid: Secondary | ICD-10-CM | POA: Diagnosis not present

## 2022-10-29 DIAGNOSIS — E119 Type 2 diabetes mellitus without complications: Secondary | ICD-10-CM | POA: Diagnosis not present

## 2022-10-29 LAB — HM DIABETES EYE EXAM

## 2022-12-02 ENCOUNTER — Telehealth: Payer: Self-pay

## 2022-12-02 ENCOUNTER — Other Ambulatory Visit: Payer: Self-pay

## 2022-12-02 ENCOUNTER — Ambulatory Visit: Payer: Medicare HMO | Admitting: Family Medicine

## 2022-12-02 ENCOUNTER — Encounter: Payer: Self-pay | Admitting: Family Medicine

## 2022-12-02 VITALS — BP 138/78 | HR 72 | Ht 73.5 in | Wt 254.0 lb

## 2022-12-02 DIAGNOSIS — N1831 Chronic kidney disease, stage 3a: Secondary | ICD-10-CM | POA: Diagnosis not present

## 2022-12-02 DIAGNOSIS — R809 Proteinuria, unspecified: Secondary | ICD-10-CM

## 2022-12-02 DIAGNOSIS — M109 Gout, unspecified: Secondary | ICD-10-CM | POA: Diagnosis not present

## 2022-12-02 DIAGNOSIS — Z1211 Encounter for screening for malignant neoplasm of colon: Secondary | ICD-10-CM

## 2022-12-02 DIAGNOSIS — E7800 Pure hypercholesterolemia, unspecified: Secondary | ICD-10-CM | POA: Diagnosis not present

## 2022-12-02 DIAGNOSIS — Z125 Encounter for screening for malignant neoplasm of prostate: Secondary | ICD-10-CM

## 2022-12-02 DIAGNOSIS — I10 Essential (primary) hypertension: Secondary | ICD-10-CM | POA: Diagnosis not present

## 2022-12-02 DIAGNOSIS — Z0000 Encounter for general adult medical examination without abnormal findings: Secondary | ICD-10-CM | POA: Diagnosis not present

## 2022-12-02 DIAGNOSIS — Z7984 Long term (current) use of oral hypoglycemic drugs: Secondary | ICD-10-CM

## 2022-12-02 DIAGNOSIS — E1129 Type 2 diabetes mellitus with other diabetic kidney complication: Secondary | ICD-10-CM | POA: Diagnosis not present

## 2022-12-02 LAB — CBC WITH DIFFERENTIAL/PLATELET
Basophils Absolute: 0.1 10*3/uL (ref 0.0–0.1)
Basophils Relative: 0.9 % (ref 0.0–3.0)
Eosinophils Absolute: 0.1 10*3/uL (ref 0.0–0.7)
Eosinophils Relative: 2.8 % (ref 0.0–5.0)
HCT: 35.6 % — ABNORMAL LOW (ref 39.0–52.0)
Hemoglobin: 10.9 g/dL — ABNORMAL LOW (ref 13.0–17.0)
Lymphocytes Relative: 19.6 % (ref 12.0–46.0)
Lymphs Abs: 1.1 10*3/uL (ref 0.7–4.0)
MCHC: 30.5 g/dL (ref 30.0–36.0)
MCV: 62.6 fL — ABNORMAL LOW (ref 78.0–100.0)
Monocytes Absolute: 0.5 10*3/uL (ref 0.1–1.0)
Monocytes Relative: 9 % (ref 3.0–12.0)
Neutro Abs: 3.7 10*3/uL (ref 1.4–7.7)
Neutrophils Relative %: 67.7 % (ref 43.0–77.0)
Platelets: 223 10*3/uL (ref 150.0–400.0)
RBC: 5.68 Mil/uL (ref 4.22–5.81)
RDW: 16.7 % — ABNORMAL HIGH (ref 11.5–15.5)
WBC: 5.4 10*3/uL (ref 4.0–10.5)

## 2022-12-02 LAB — COMPREHENSIVE METABOLIC PANEL
ALT: 13 U/L (ref 0–53)
AST: 20 U/L (ref 0–37)
Albumin: 4.1 g/dL (ref 3.5–5.2)
Alkaline Phosphatase: 58 U/L (ref 39–117)
BUN: 21 mg/dL (ref 6–23)
CO2: 25 meq/L (ref 19–32)
Calcium: 9.1 mg/dL (ref 8.4–10.5)
Chloride: 106 meq/L (ref 96–112)
Creatinine, Ser: 1.19 mg/dL (ref 0.40–1.50)
GFR: 60.16 mL/min (ref >60.00)
Glucose, Bld: 140 mg/dL — ABNORMAL HIGH (ref 70–99)
Potassium: 4.3 meq/L (ref 3.5–5.1)
Sodium: 140 meq/L (ref 135–145)
Total Bilirubin: 1.1 mg/dL (ref 0.2–1.2)
Total Protein: 6.6 g/dL (ref 6.0–8.3)

## 2022-12-02 LAB — LIPID PANEL
Cholesterol: 117 mg/dL (ref 0–200)
HDL: 44.4 mg/dL (ref >39.00)
LDL Cholesterol: 54 mg/dL (ref 0–99)
NonHDL: 73.08
Total CHOL/HDL Ratio: 3
Triglycerides: 97 mg/dL (ref 0.0–149.0)
VLDL: 19.4 mg/dL (ref 0.0–40.0)

## 2022-12-02 LAB — POCT GLYCOSYLATED HEMOGLOBIN (HGB A1C)
HbA1c POC (<> result, manual entry): 6.7 % (ref 4.0–5.6)
HbA1c, POC (controlled diabetic range): 6.7 % (ref 0.0–7.0)
HbA1c, POC (prediabetic range): 6.7 % — AB (ref 5.7–6.4)
Hemoglobin A1C: 6.7 % — AB (ref 4.0–5.6)

## 2022-12-02 LAB — PSA, MEDICARE: PSA: 0.92 ng/mL (ref 0.10–4.00)

## 2022-12-02 MED ORDER — PREDNISONE 20 MG PO TABS: 40.0000 mg | ORAL_TABLET | Freq: Every day | ORAL | 0 refills | Status: DC

## 2022-12-02 MED ORDER — PREDNISONE 20 MG PO TABS: 40.0000 mg | ORAL_TABLET | Freq: Every day | ORAL | 0 refills | Status: AC

## 2022-12-02 NOTE — Telephone Encounter (Signed)
Patient was seen today by Dr. Milinda Cave. Prescribed Prednisone. Rx was sent to mail order pharmacy.  Can we call [employer] to stop processing mail order request and send prescription to [facility].  Thank you

## 2022-12-02 NOTE — Progress Notes (Signed)
Office Note 12/02/2022  CC:  Chief Complaint  Patient presents with   Annual Exam    Pt is fasting.     HPI:  Patient is a 72 y.o. male who is here for annual health maintenance exam and 24-month follow-up diabetes, hypertension, and chronic renal insufficiency stage III.  Diet not as good lately, also missing morning doses of meds more lately.  Past Medical History:  Diagnosis Date   Carpal tunnel syndrome on left    Cataract 07/2015   R eye--surgery recommended by his eye MD 08/03/15   Chronic fatigue    Chronic renal insufficiency, stage 3 (moderate) (HCC) 2021   Cr 1.2-1.4, GFR 50s-60s   Closed fracture of greater tuberosity of right humerus 02/28/2019   Immobil w/sling (Dr. Dion Saucier)   Colon cancer screening 02/15/2018   iFOB neg 02/2018,  06/2020, and 09/2021.   Community acquired pneumonia    06/2022 +sepsis--> hospitalization.  Needs follow-up CT with contrast chest in 3 months.   Erectile dysfunction    Dr. Patsi Sears.  Testosterone normal 04/2016 at [facility].   Gout of big toe 1990s   only one episode; he cut back on peanut butter and hasn't had any prob since   Herpes simplex ophthalmicus    L eye, has opthalmologist, uses prednisone drops prn   History of gallstones    per pt report   History of kidney stones    History of prostatitis 1980s   Remote hx of seeing Dr. Vonita Moss at [facility]--then Dr. Patsi Sears.    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 Dr. Jorja Loa   Shingles    recurrent L thorax   Sinus disease    Thalassemia    Mild anemia with MCV 60s (alpha thal minor or beta thal trait: Hb electrophoresis not done/no result available.).   Type 2 diabetes mellitus with microalbuminuria (HCC) Dx'd 08/2013    Past Surgical History:  Procedure Laterality Date   CHOLECYSTECTOMY N/A 01/26/2020   Procedure: LAPAROSCOPIC CHOLECYSTECTOMY;  Surgeon:  Abigail Miyamoto, MD;  Location: [facility];  Service: General;  Laterality: N/A;   NASAL POLYP EXCISION  1960   NASAL SINUS SURGERY  03/20/2022   WISDOM TOOTH EXTRACTION      Family History  Problem Relation Age of Onset   Heart disease Mother    Hypertension Mother    Diabetes Mother    Heart disease Father    Heart attack Sister     Social History   Socioeconomic History   Marital status: Married    Spouse name: Not on file   Number of children: Not on file   Years of education: Not on file   Highest education level: Some college, no degree  Occupational History   Not on file  Tobacco Use   Smoking status: Former    Current packs/day: 0.00    Types: Cigarettes    Quit date: 02/28/1970    Years since quitting: 52.7   Smokeless tobacco: Never  Vaping Use   Vaping status: Never Used  Substance and Sexual Activity   Alcohol use: No    Alcohol/week: 0.0 standard drinks of alcohol   Drug use: No   Sexual activity: Not on file  Other Topics Concern   Not on file  Social History Narrative   Married, has one 64 y/o son.   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  Orig from [place].   Was in [employer] [phone].  No combat duty.  No VA care.   Smoked 10-12 yrs, quit in his early 27s.   Alcohol: none   No hx of drugs.            Social Determinants of Health   Financial Resource Strain: Low Risk  (07/30/2022)   Overall Financial Resource Strain (CARDIA)    Difficulty of Paying Living Expenses: Not hard at all  Food Insecurity: No Food Insecurity (07/30/2022)   Hunger Vital Sign    Worried About Running Out of Food in the Last Year: Never true    Ran Out of Food in the Last Year: Never true  Transportation Needs: No Transportation Needs (07/30/2022)   PRAPARE - Administrator, Civil Service (Medical): No    Lack of Transportation (Non-Medical): No  Physical Activity: Inactive (07/30/2022)   Exercise Vital Sign    Days of Exercise per  Week: 0 days    Minutes of Exercise per Session: 0 min  Stress: No Stress Concern Present (07/30/2022)   [employer] of Occupational Health - Occupational Stress Questionnaire    Feeling of Stress : Not at all  Social Connections: Moderately Integrated (07/30/2022)   Social Connection and Isolation Panel [NHANES]    Frequency of Communication with Friends and Family: Once a week    Frequency of Social Gatherings with Friends and Family: Three times a week    Attends Religious Services: More than 4 times per year    Active Member of Clubs or Organizations: No    Attends Banker Meetings: Never    Marital Status: Married  Catering manager Violence: Not At Risk (07/30/2022)   Humiliation, Afraid, Rape, and Kick questionnaire    Fear of Current or Ex-Partner: No    Emotionally Abused: No    Physically Abused: No    Sexually Abused: No    Outpatient Medications Prior to Visit  Medication Sig Dispense Refill   acetaminophen (TYLENOL) 500 MG tablet Take 500-1,000 mg by mouth every 6 (six) hours as needed (pain.).     albuterol (VENTOLIN HFA) 108 (90 Base) MCG/ACT inhaler Inhale 2 puffs into the lungs every 6 (six) hours as needed for wheezing or shortness of breath. 8 g 2   amLODipine (NORVASC) 10 MG tablet Take 1 tablet (10 mg total) by mouth daily. 90 tablet 3   atorvastatin (LIPITOR) 20 MG tablet Take 1 tablet (20 mg total) by mouth daily. 90 tablet 3   azelastine (ASTELIN) 0.1 % nasal spray Place 2 sprays into both nostrils 2 (two) times daily. Use in each nostril as directed     cetirizine (ZYRTEC) 10 MG tablet Take 10 mg by mouth daily.     fluticasone (FLONASE) 50 MCG/ACT nasal spray Place 2 sprays into both nostrils daily.     hydrochlorothiazide (HYDRODIURIL) 25 MG tablet Take 0.5 tablets (12.5 mg total) by mouth daily. 90 tablet 2   hydrocortisone valerate ointment (WEST-CORT) 0.2 % Appy bid to affected area prn 15 g 2   lisinopril (ZESTRIL) 10 MG tablet Take 1  tablet (10 mg total) by mouth daily. 90 tablet 1   metFORMIN (GLUCOPHAGE) 1000 MG tablet Take 1 tablet (1,000 mg total) by mouth 2 (two) times daily with a meal. 180 tablet 2   pioglitazone (ACTOS) 45 MG tablet Take 1 tablet (45 mg total) by mouth daily. 90 tablet 3   prednisoLONE acetate (PRED FORTE) 1 % ophthalmic suspension  Place 1 drop into both eyes daily as needed (eye irritation).     sodium chloride (OCEAN) 0.65 % SOLN nasal spray Place 1 spray into both nostrils as needed for congestion.     No facility-administered medications prior to visit.    No Known Allergies  Review of Systems  Constitutional:  Negative for appetite change, chills, fatigue and fever.  HENT:  Negative for congestion, dental problem, ear pain and sore throat.   Eyes:  Negative for discharge, redness and visual disturbance.  Respiratory:  Negative for cough, chest tightness, shortness of breath and wheezing.   Cardiovascular:  Negative for chest pain, palpitations and leg swelling.  Gastrointestinal:  Negative for abdominal pain, blood in stool, diarrhea, nausea and vomiting.  Genitourinary:  Negative for difficulty urinating, dysuria, flank pain, frequency, hematuria and urgency.  Musculoskeletal:  Negative for arthralgias, back pain, joint swelling, myalgias and neck stiffness.  Skin:  Negative for pallor and rash.  Neurological:  Negative for dizziness, speech difficulty, weakness and headaches.  Hematological:  Negative for adenopathy. Does not bruise/bleed easily.  Psychiatric/Behavioral:  Negative for confusion and sleep disturbance. The patient is not nervous/anxious.     PE;    12/02/2022    7:58 AM 08/28/2022    8:57 AM 08/28/2022    8:48 AM  Vitals with BMI  Height 6' 1.5"    Weight 254 lbs  253 lbs 6 oz  BMI 33.05    Systolic 146 132 161  Diastolic 78 60 79  Pulse 72  88    Gen: Alert, well appearing.  Patient is oriented to person, place, time, and situation. AFFECT: pleasant, lucid  thought and speech. ENT: Ears: EACs clear, normal epithelium.  TMs with good light reflex and landmarks bilaterally.  Eyes: no injection, icteris, swelling, or exudate.  EOMI, PERRLA. Nose: no drainage or turbinate edema/swelling.  No injection or focal lesion.  Mouth: lips without lesion/swelling.  Oral mucosa pink and moist.  Dentition intact and without obvious caries or gingival swelling.  Oropharynx without erythema, exudate, or swelling.  Neck: supple/nontender.  No LAD, mass, or TM.  Carotid pulses 2+ bilaterally, without bruits. CV: RRR, no m/r/g.   LUNGS: CTA bilat, nonlabored resps, good aeration in all lung fields. ABD: soft, NT, ND, BS normal.  No hepatospenomegaly or mass.  No bruits. EXT: no clubbing, cyanosis, or edema.  Musculoskeletal: no joint swelling, erythema, warmth, or tenderness.  ROM of all joints intact. Skin - no sores or suspicious lesions or rashes or color changes  Pertinent labs:  Lab Results  Component Value Date   TSH 0.84 07/24/2017   Lab Results  Component Value Date   WBC 6.7 09/01/2022   HGB 10.0 (L) 09/01/2022   HCT 33.5 (L) 09/01/2022   MCV 64.1 (L) 09/01/2022   PLT 271 09/01/2022   Lab Results  Component Value Date   CREATININE 1.39 (H) 07/15/2022   BUN 25 07/15/2022   NA 140 07/15/2022   K 3.8 07/15/2022   CL 102 07/15/2022   CO2 25 07/15/2022   Lab Results  Component Value Date   ALT 21 07/15/2022   AST 16 07/15/2022   ALKPHOS 58 07/08/2022   BILITOT 1.2 07/15/2022   Lab Results  Component Value Date   CHOL 124 10/08/2021   Lab Results  Component Value Date   HDL 44.00 10/08/2021   Lab Results  Component Value Date   LDLCALC 58 10/08/2021   Lab Results  Component Value Date  TRIG 112.0 10/08/2021   Lab Results  Component Value Date   CHOLHDL 3 10/08/2021   Lab Results  Component Value Date   LABURIC 8.7 (H) 11/12/2020   Lab Results  Component Value Date   PSA 0.69 06/26/2021   PSA 0.93 05/31/2019   PSA 0.86  02/01/2018   Lab Results  Component Value Date   HGBA1C 6.7 (A) 12/02/2022   HGBA1C 6.7 12/02/2022   HGBA1C 6.7 (A) 12/02/2022   HGBA1C 6.7 12/02/2022   ASSESSMENT AND PLAN:   #1 health maintenance exam: Reviewed age and gender appropriate health maintenance issues (prudent diet, regular exercise, health risks of tobacco and excessive alcohol, use of seatbelts, fire alarms in home, use of sunscreen).  Also reviewed age and gender appropriate health screening as well as vaccine recommendations. Vaccines: Shingrix-pt declined.  Flu declined.  O/W UTD. Labs: cbc,bmet,Hba1c,urine microalb/cr Prostate ca screening: PSA today Colon ca screening: iFOB neg x 3 in the last few years-->iFOB ordered.  #2 diabetes with microalbuminuria, good control. POC Hba1c today is 6.7%. Continue metformin 1000 mg twice daily and pioglitazone 40 mg daily.  #3 hypertension, well controlled on amlodipine 10mg  every day, 12.5mg  hydrochlorothiazide every day and lisinopril 10mg  every day. Lytes/cr today.  #4 chronic renal insufficiency stage III. He avoids NSAIDs. Monitor electrolytes and creatinine today.  #5 history of gouty arthritis.  He has about 2 flares per year, always in MTP joint of either foot. He is not in favor of gout preventative medication. He gets good response from a couple of days of prednisone so I sent in a prescription today for him to have on hand. Low purine diet reviewed, handout given.  An After Visit Summary was printed and given to the patient.  FOLLOW UP:  Return in about 3 months (around 03/04/2023) for routine chronic illness f/u.  Signed:  Santiago Bumpers, MD           12/02/2022

## 2022-12-02 NOTE — Patient Instructions (Signed)
Low-Purine Eating Plan A low-purine eating plan involves making food choices to limit your purine intake. Purine is a kind of uric acid. Too much uric acid in your blood can cause certain conditions, such as gout and kidney stones. Eating a low-purine diet may help control these conditions. What are tips for following this plan? Shopping Avoid buying products that contain high-fructose corn syrup. Check for this on food labels. It is commonly found in many processed foods and soft drinks. Be sure to check for it in baked goods such as cookies, canned fruits, and cereals and cereal bars. Avoid buying veal, chicken breast with skin, lamb, and organ meats such as liver. These types of meats tend to have the highest purine content. Choose dairy products. These may lower uric acid levels. Avoid certain types of fish. Not all fish and seafood have high purine content. Examples with high purine content include anchovies, trout, tuna, sardines, and salmon. Avoid buying beverages that contain alcohol, particularly beer and hard liquor. Alcohol can affect the way your body gets rid of uric acid. Meal planning  Learn which foods do or do not affect you. If you find out that a food tends to cause your gout symptoms to flare up, avoid eating that food. You can enjoy foods that do not cause problems. If you have any questions about a food item, talk with your dietitian or health care provider. Reduce the overall amount of meat in your diet. When you do eat meat, choose ones with lower purine content. Include plenty of fruits and vegetables. Although some vegetables may have a high purine content--such as asparagus, mushrooms, spinach, or cauliflower--it has been shown that these do not contribute to uric acid blood levels as much. Consume at least 1 dairy serving a day. This has been shown to decrease uric acid levels. General information If you drink alcohol: Limit how much you have to: 0-1 drink a day for  women who are not pregnant. 0-2 drinks a day for men. Know how much alcohol is in a drink. In the [place] one drink equals one 12 oz bottle of beer (355 mL), one 5 oz glass of wine (148 mL), or one 1 oz glass of hard liquor (44 mL). Drink plenty of water. Try to drink enough to keep your urine pale yellow. Fluids can help remove uric acid from your body. Work with your health care provider and dietitian to develop a plan to achieve or maintain a healthy weight. Losing weight may help reduce uric acid in your blood. What foods are recommended? The following are some types of foods that are good choices when limiting purine intake: Fresh or frozen fruits and vegetables. Whole grains, breads, cereals, and pasta. Rice. Beans, peas, legumes. Nuts and seeds. Dairy products. Fats and oils. The items listed above may not be a complete list. Talk with a dietitian about what dietary choices are best for you. What foods are not recommended? Limit your intake of foods high in purines, including: Beer and other alcohol. Meat-based gravy or sauce. Canned or fresh fish, such as: Anchovies, sardines, herring, salmon, and tuna. Mussels and scallops. Codfish, trout, and haddock. Bacon, veal, chicken breast with skin, and lamb. Organ meats, such as: Liver or kidney. Tripe. Sweetbreads (thymus gland or pancreas). Wild Education officer, environmental. Yeast or yeast extract supplements. Drinks sweetened with high-fructose corn syrup, such as soda. Processed foods made with high-fructose corn syrup. The items listed above may not be a complete list of foods  and beverages you should limit. Contact a dietitian for more information. Summary Eating a low-purine diet may help control conditions caused by too much uric acid in the body, such as gout or kidney stones. Choose low-purine foods, limit alcohol, and limit high-fructose corn syrup. You will learn over time which foods do or do not affect you. If you find out that a  food tends to cause your gout symptoms to flare up, avoid eating that food. This information is not intended to replace advice given to you by your health care provider. Make sure you discuss any questions you have with your health care provider. Document Revised: 01/10/2021 Document Reviewed: 01/10/2021 Elsevier Patient Education  2024 [employer].

## 2022-12-02 NOTE — Telephone Encounter (Signed)
Rx cancelled and sent to local pharmacy per pts request.

## 2023-02-13 ENCOUNTER — Ambulatory Visit (INDEPENDENT_AMBULATORY_CARE_PROVIDER_SITE_OTHER): Payer: Medicare HMO | Admitting: Family Medicine

## 2023-02-13 ENCOUNTER — Encounter: Payer: Self-pay | Admitting: Family Medicine

## 2023-02-13 VITALS — BP 144/82 | HR 89 | Wt 253.6 lb

## 2023-02-13 DIAGNOSIS — M10072 Idiopathic gout, left ankle and foot: Secondary | ICD-10-CM

## 2023-02-13 DIAGNOSIS — M7752 Other enthesopathy of left foot: Secondary | ICD-10-CM

## 2023-02-13 LAB — URIC ACID: Uric Acid, Serum: 11.3 mg/dL — ABNORMAL HIGH (ref 4.0–7.8)

## 2023-02-13 LAB — SEDIMENTATION RATE: Sed Rate: 25 mm/h — ABNORMAL HIGH (ref 0–20)

## 2023-02-13 MED ORDER — PREDNISONE 20 MG PO TABS: ORAL_TABLET | ORAL | 0 refills | Status: DC

## 2023-02-13 NOTE — Progress Notes (Signed)
 OFFICE VISIT  02/13/2023  CC:  Chief Complaint  Patient presents with   Concern of Gout    L foot, ankle; Swollen, painful. Pt suspects it to be gout. Pt requesting prednisone .    Patient is a 73 y.o. male who presents for concern of gout.  HPI: Onset 5 days ago of pain in the back of the left heel.  Went to bed 1 night feeling fine and woke up the next morning with it hurting.  No preceding strain, overuse, or trauma. Worse when standing and walking. No redness or warmth.  It does swell some.  Fairly recently had some pain over the top of the left foot and in the left MTP joint but this has completely resolved. He has noted his gout flares have increased in frequency over the last year--has had 3-4 episodes in the last year.  No fever, myalgias, rash, or weight loss.  Past Medical History:  Diagnosis Date   Carpal tunnel syndrome on left    Cataract 07/2015   R eye--surgery recommended by his eye MD 08/03/15   Chronic fatigue    Chronic renal insufficiency, stage 3 (moderate) (HCC) 2021   Cr 1.2-1.4, GFR 50s-60s   Closed fracture of greater tuberosity of right humerus 02/28/2019   Immobil w/sling (Dr. Josefina)   Colon cancer screening 02/15/2018   iFOB neg 02/2018,  06/2020, and 09/2021.   Community acquired pneumonia    06/2022 +sepsis--> hospitalization.  Needs follow-up CT with contrast chest in 3 months.   Erectile dysfunction    Dr. Chales.  Testosterone  normal 04/2016 at [facility].   Gout of big toe 1990s   about 2 episodes per yr, pt favors no prev med   Herpes simplex ophthalmicus    L eye, has opthalmologist, uses prednisone  drops prn   History of gallstones    per pt report   History of kidney stones    History of prostatitis 1980s   Remote hx of seeing Dr. Andra at [facility]--then Dr. Chales.    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 Dr. Livingston    Shingles    recurrent L thorax   Sinus disease    Thalassemia    Mild anemia with MCV 60s (alpha thal minor or beta thal trait: Hb electrophoresis not done/no result available.).   Type 2 diabetes mellitus with microalbuminuria (HCC) Dx'd 08/2013    Past Surgical History:  Procedure Laterality Date   CHOLECYSTECTOMY N/A 01/26/2020   Procedure: LAPAROSCOPIC CHOLECYSTECTOMY;  Surgeon: Vernetta Berg, MD;  Location: [facility];  Service: General;  Laterality: N/A;   NASAL POLYP EXCISION  1960   NASAL SINUS SURGERY  03/20/2022   WISDOM TOOTH EXTRACTION      Outpatient Medications Prior to Visit  Medication Sig Dispense Refill   acetaminophen  (TYLENOL ) 500 MG tablet Take 500-1,000 mg by mouth every 6 (six) hours as needed (pain.).     albuterol  (VENTOLIN  HFA) 108 (90 Base) MCG/ACT inhaler Inhale 2 puffs into the lungs every 6 (six) hours as needed for wheezing or shortness of breath. 8 g 2   amLODipine  (NORVASC ) 10 MG tablet Take 1 tablet (10 mg total) by mouth daily. 90 tablet 3   atorvastatin  (LIPITOR) 20 MG tablet Take 1 tablet (20 mg total) by mouth daily. 90 tablet 3   azelastine  (ASTELIN ) 0.1 % nasal spray Place 2 sprays into both nostrils 2 (two)  times daily. Use in each nostril as directed     cetirizine (ZYRTEC) 10 MG tablet Take 10 mg by mouth daily.     fluticasone  (FLONASE ) 50 MCG/ACT nasal spray Place 2 sprays into both nostrils daily.     hydrochlorothiazide  (HYDRODIURIL ) 25 MG tablet Take 0.5 tablets (12.5 mg total) by mouth daily. 90 tablet 2   hydrocortisone  valerate ointment (WEST-CORT) 0.2 % Appy bid to affected area prn 15 g 2   lisinopril  (ZESTRIL ) 10 MG tablet Take 1 tablet (10 mg total) by mouth daily. 90 tablet 1   metFORMIN  (GLUCOPHAGE ) 1000 MG tablet Take 1 tablet (1,000 mg total) by mouth 2 (two) times daily with a meal. 180 tablet 2   pioglitazone  (ACTOS ) 45 MG tablet Take 1 tablet (45 mg total) by mouth daily. 90 tablet 3   prednisoLONE acetate (PRED FORTE) 1 %  ophthalmic suspension Place 1 drop into both eyes daily as needed (eye irritation).     sodium chloride  (OCEAN) 0.65 % SOLN nasal spray Place 1 spray into both nostrils as needed for congestion.     No facility-administered medications prior to visit.    No Known Allergies  Review of Systems  As per HPI  PE:    02/13/2023   11:07 AM 02/13/2023   11:00 AM 12/02/2022    8:33 AM  Vitals with BMI  Weight  253 lbs 10 oz   Systolic 144 157 861  Diastolic 82 83 78  Pulse  89      Physical Exam  Gen: Alert, well appearing.  Patient is oriented to person, place, time, and situation. Focal tenderness to palpation at the insertion of the Achilles tendon.  No swelling appreciated.  No warmth. The tenderness is fairly mild but he limps significantly.  Ankle strength and range of motion fully intact. Remainder of ankles and feet normal.  LABS:  Last CBC Lab Results  Component Value Date   WBC 5.4 12/02/2022   HGB 10.9 (L) 12/02/2022   HCT 35.6 (L) 12/02/2022   MCV 62.6 Wrong tube collected (L) 12/02/2022   MCH 19.1 (L) 09/01/2022   RDW 16.7 (H) 12/02/2022   PLT 223.0 12/02/2022   Last metabolic panel Lab Results  Component Value Date   GLUCOSE 140 (H) 12/02/2022   NA 140 12/02/2022   K 4.3 12/02/2022   CL 106 12/02/2022   CO2 25 12/02/2022   BUN 21 12/02/2022   CREATININE 1.19 12/02/2022   GFR 60.16 12/02/2022   CALCIUM  9.1 12/02/2022   PROT 6.6 12/02/2022   ALBUMIN 4.1 12/02/2022   BILITOT 1.1 12/02/2022   ALKPHOS 58 12/02/2022   AST 20 12/02/2022   ALT 13 12/02/2022   ANIONGAP 13 07/09/2022   Last hemoglobin A1c Lab Results  Component Value Date   HGBA1C 6.7 (A) 12/02/2022   HGBA1C 6.7 12/02/2022   HGBA1C 6.7 (A) 12/02/2022   HGBA1C 6.7 12/02/2022   IMPRESSION AND PLAN:  Acute left retrocalcaneal bursitis, suspect gouty.  Bedside [facility] ultrasound today: Left ankle with small amount of fluid and some synovitis changes in the retrocalcaneal bursa.  Achilles  tendon fully intact without hypoechoic or hyperechoic changes.  Tendon thickness normal.  No calcaneal cortex abnormality visible. Right retrocalcaneal bursa without any fluid at all.  Plan: Prednisone  40 mg a day x 5 days. He is going to start an over-the-counter cherry extract daily as a preventative.  We will give this a minimum of a few months to see if effective in preventing  any flareups. If not, we will have to get him on allopurinol. Check uric acid and sed rate today.  An After Visit Summary was printed and given to the patient.  FOLLOW UP: Return for keep appt already set for 1/22.  Signed:  Gerlene Hockey, MD           02/13/2023

## 2023-03-04 ENCOUNTER — Ambulatory Visit (INDEPENDENT_AMBULATORY_CARE_PROVIDER_SITE_OTHER): Payer: Medicare HMO | Admitting: Family Medicine

## 2023-03-04 ENCOUNTER — Encounter: Payer: Self-pay | Admitting: Family Medicine

## 2023-03-04 VITALS — BP 137/79 | HR 86 | Wt 254.0 lb

## 2023-03-04 DIAGNOSIS — E1121 Type 2 diabetes mellitus with diabetic nephropathy: Secondary | ICD-10-CM | POA: Diagnosis not present

## 2023-03-04 DIAGNOSIS — N1831 Chronic kidney disease, stage 3a: Secondary | ICD-10-CM

## 2023-03-04 DIAGNOSIS — M109 Gout, unspecified: Secondary | ICD-10-CM

## 2023-03-04 DIAGNOSIS — Z1211 Encounter for screening for malignant neoplasm of colon: Secondary | ICD-10-CM | POA: Diagnosis not present

## 2023-03-04 DIAGNOSIS — Z7984 Long term (current) use of oral hypoglycemic drugs: Secondary | ICD-10-CM

## 2023-03-04 DIAGNOSIS — E119 Type 2 diabetes mellitus without complications: Secondary | ICD-10-CM | POA: Diagnosis not present

## 2023-03-04 DIAGNOSIS — I10 Essential (primary) hypertension: Secondary | ICD-10-CM

## 2023-03-04 LAB — POCT GLYCOSYLATED HEMOGLOBIN (HGB A1C)
HbA1c POC (<> result, manual entry): 7.8 % (ref 4.0–5.6)
HbA1c, POC (controlled diabetic range): 7.8 % — AB (ref 0.0–7.0)
HbA1c, POC (prediabetic range): 7.8 % — AB (ref 5.7–6.4)
Hemoglobin A1C: 7.8 % — AB (ref 4.0–5.6)

## 2023-03-04 MED ORDER — PREDNISONE 20 MG PO TABS: ORAL_TABLET | ORAL | 0 refills | Status: DC

## 2023-03-04 MED ORDER — HYDROCHLOROTHIAZIDE 25 MG PO TABS: ORAL_TABLET | ORAL | Status: DC

## 2023-03-04 NOTE — Patient Instructions (Signed)
Increase hydrochlorothiazide (HCTZ) to a WHOLE 25mg  tab every day

## 2023-03-04 NOTE — Progress Notes (Signed)
OFFICE VISIT  03/04/2023  CC:  Chief Complaint  Patient presents with   Diabetes    Pt is fasting.     Patient is a 73 y.o. male who presents for 50-month follow-up diabetes, hypertension, and chronic renal insufficiency stage III. A/P as of last visit: "#1 diabetes with microalbuminuria, good control. POC Hba1c today is 6.7%. Continue metformin 1000 mg twice daily and pioglitazone 40 mg daily.   #2 hypertension, well controlled on amlodipine 10mg  every day, 12.5mg  hydrochlorothiazide every day and lisinopril 10mg  every day. Lytes/cr today.   #3 chronic renal insufficiency stage III. He avoids NSAIDs. Monitor electrolytes and creatinine today.   #4 history of gouty arthritis.  He has about 2 flares per year, always in MTP joint of either foot. He is not in favor of gout preventative medication. He gets good response from a couple of days of prednisone so I sent in a prescription today for him to have on hand. Low purine diet reviewed, handout given."  INTERIM HX: Zachary Chandler is feeling well. He did end up taking the prednisone 5-day course that gave him for his recent podagra. Foot does not hurt at all now.  No home glucose or blood pressure monitoring lately. He does recall a typical systolic pressure of around 140 when he takes it at home.  ROS as above, plus--> no fevers, no CP, no SOB, no wheezing, no cough, no dizziness, no HAs, no rashes, no melena/hematochezia.  No polyuria or polydipsia.  No myalgias or arthralgias.  No focal weakness, paresthesias, or tremors.  No acute vision or hearing abnormalities.  No dysuria or unusual/new urinary urgency or frequency.  No recent changes in lower legs. No n/v/d or abd pain.  No palpitations.     Past Medical History:  Diagnosis Date   Carpal tunnel syndrome on left    Cataract 07/2015   R eye--surgery recommended by his eye MD 08/03/15   Chronic fatigue    Chronic renal insufficiency, stage 3 (moderate) (HCC) 2021   Cr 1.2-1.4,  GFR 50s-60s   Closed fracture of greater tuberosity of right humerus 02/28/2019   Immobil w/sling (Dr. Dion Saucier)   Colon cancer screening 02/15/2018   iFOB neg 02/2018,  06/2020, and 09/2021.   Community acquired pneumonia    06/2022 +sepsis--> hospitalization.  Needs follow-up CT with contrast chest in 3 months.   Erectile dysfunction    Dr. Patsi Sears.  Testosterone normal 04/2016 at [facility].   Gout of big toe 1990s   about 2 episodes per yr, pt favors no prev med   Herpes simplex ophthalmicus    L eye, has opthalmologist, uses prednisone drops prn   History of gallstones    per pt report   History of kidney stones    History of prostatitis 1980s   Remote hx of seeing Dr. Vonita Moss at [facility]--then Dr. Patsi Sears.    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 Dr. Jorja Loa   Shingles    recurrent L thorax   Sinus disease    Thalassemia    Mild anemia with MCV 60s (alpha thal minor or beta thal trait: Hb electrophoresis not done/no result available.).   Type 2 diabetes mellitus with microalbuminuria (HCC) Dx'd 08/2013    Past Surgical History:  Procedure Laterality Date   CHOLECYSTECTOMY N/A 01/26/2020   Procedure: LAPAROSCOPIC CHOLECYSTECTOMY;  Surgeon: Abigail Miyamoto, MD;  Location: [facility];  Service:  General;  Laterality: N/A;   NASAL POLYP EXCISION  1960   NASAL SINUS SURGERY  03/20/2022   WISDOM TOOTH EXTRACTION      Outpatient Medications Prior to Visit  Medication Sig Dispense Refill   acetaminophen (TYLENOL) 500 MG tablet Take 500-1,000 mg by mouth every 6 (six) hours as needed (pain.).     albuterol (VENTOLIN HFA) 108 (90 Base) MCG/ACT inhaler Inhale 2 puffs into the lungs every 6 (six) hours as needed for wheezing or shortness of breath. 8 g 2   amLODipine (NORVASC) 10 MG tablet Take 1 tablet (10 mg total) by mouth daily. 90 tablet 3   atorvastatin (LIPITOR) 20 MG  tablet Take 1 tablet (20 mg total) by mouth daily. 90 tablet 3   azelastine (ASTELIN) 0.1 % nasal spray Place 2 sprays into both nostrils 2 (two) times daily. Use in each nostril as directed     cetirizine (ZYRTEC) 10 MG tablet Take 10 mg by mouth daily.     fluticasone (FLONASE) 50 MCG/ACT nasal spray Place 2 sprays into both nostrils daily.     hydrocortisone valerate ointment (WEST-CORT) 0.2 % Appy bid to affected area prn 15 g 2   lisinopril (ZESTRIL) 10 MG tablet Take 1 tablet (10 mg total) by mouth daily. 90 tablet 1   metFORMIN (GLUCOPHAGE) 1000 MG tablet Take 1 tablet (1,000 mg total) by mouth 2 (two) times daily with a meal. 180 tablet 2   pioglitazone (ACTOS) 45 MG tablet Take 1 tablet (45 mg total) by mouth daily. 90 tablet 3   prednisoLONE acetate (PRED FORTE) 1 % ophthalmic suspension Place 1 drop into both eyes daily as needed (eye irritation).     sodium chloride (OCEAN) 0.65 % SOLN nasal spray Place 1 spray into both nostrils as needed for congestion.     hydrochlorothiazide (HYDRODIURIL) 25 MG tablet Take 0.5 tablets (12.5 mg total) by mouth daily. 90 tablet 2   predniSONE (DELTASONE) 20 MG tablet 2 tabs po every day x 5d 10 tablet 0   No facility-administered medications prior to visit.    No Known Allergies  Review of Systems As per HPI  PE:    03/04/2023    8:11 AM 02/13/2023   11:07 AM 02/13/2023   11:00 AM  Vitals with BMI  Weight 254 lbs  253 lbs 10 oz  Systolic 137 144 604  Diastolic 79 82 83  Pulse 86  89     Physical Exam  Gen: Alert, well appearing.  Patient is oriented to person, place, time, and situation. AFFECT: pleasant, lucid thought and speech. CV: RRR, no m/r/g.   LUNGS: CTA bilat, nonlabored resps, good aeration in all lung fields. EXT: no clubbing or cyanosis.  Trace bilat LE pitting edema.    LABS:  Last CBC Lab Results  Component Value Date   WBC 5.4 12/02/2022   HGB 10.9 (L) 12/02/2022   HCT 35.6 (L) 12/02/2022   MCV 62.6 Wrong  tube collected (L) 12/02/2022   MCH 19.1 (L) 09/01/2022   RDW 16.7 (H) 12/02/2022   PLT 223.0 12/02/2022   Last metabolic panel Lab Results  Component Value Date   GLUCOSE 140 (H) 12/02/2022   NA 140 12/02/2022   K 4.3 12/02/2022   CL 106 12/02/2022   CO2 25 12/02/2022   BUN 21 12/02/2022   CREATININE 1.19 12/02/2022   GFR 60.16 12/02/2022   CALCIUM 9.1 12/02/2022   PROT 6.6 12/02/2022   ALBUMIN 4.1 12/02/2022   BILITOT  1.1 12/02/2022   ALKPHOS 58 12/02/2022   AST 20 12/02/2022   ALT 13 12/02/2022   ANIONGAP 13 07/09/2022   Last lipids Lab Results  Component Value Date   CHOL 117 12/02/2022   HDL 44.40 12/02/2022   LDLCALC 54 12/02/2022   TRIG 97.0 12/02/2022   CHOLHDL 3 12/02/2022   Last hemoglobin A1c Lab Results  Component Value Date   HGBA1C 6.7 (A) 12/02/2022   HGBA1C 6.7 12/02/2022   HGBA1C 6.7 (A) 12/02/2022   HGBA1C 6.7 12/02/2022   Last thyroid functions Lab Results  Component Value Date   TSH 0.84 07/24/2017   Lab Results  Component Value Date   LABURIC 11.3 (H) 02/13/2023   IMPRESSION AND PLAN:  #1 diabetes with microalbuminuria, worsened control the last 3 mo. POC Hba1c today is 7.8%. He prefers to try to make some improvements in his diet and continue metformin 1000 mg twice daily and pioglitazone 40 mg daily.  No additional medications today.   #2 hypertension, not ideal control on amlodipine 10mg  every day, 12.5mg  hydrochlorothiazide every day and lisinopril 10mg  every day. Will increase HCTZ to 25 mg daily. Lytes/cr today.   #3 chronic renal insufficiency stage III. He avoids NSAIDs. Monitor electrolytes and creatinine today.   #4 history of gouty arthritis.  He has about 2-3 flares per year, always in MTP joint of either foot. His episodes are usually pretty mild and short, not always requiring prednisone. He is not in favor of gout preventative medication at this time but if frequency of flares increase or if severity increases he  is agreeable to getting on preventative medication.  Would start allopurinol at that time as long as renal function stayed around GFR of 50s. Also need to consider the option of getting him off the HCTZ b/c thiazides can increase the risk of gout.  He gets good response from a couple of days of prednisone so I sent in a prescription today for him to have on hand. Low purine diet. He is trying cherry extract as well.  An After Visit Summary was printed and given to the patient.  FOLLOW UP: Return in about 3 months (around 06/02/2023) for routine chronic illness f/u. Next cpe 11/2023 Signed:  Santiago Bumpers, MD           03/04/2023

## 2023-03-05 ENCOUNTER — Encounter: Payer: Self-pay | Admitting: Family Medicine

## 2023-03-05 LAB — BASIC METABOLIC PANEL
BUN: 17 mg/dL (ref 7–25)
CO2: 24 mmol/L (ref 20–32)
Calcium: 9.3 mg/dL (ref 8.6–10.3)
Chloride: 106 mmol/L (ref 98–110)
Creat: 1.11 mg/dL (ref 0.70–1.28)
Glucose, Bld: 175 mg/dL — ABNORMAL HIGH (ref 65–99)
Potassium: 4.4 mmol/L (ref 3.5–5.3)
Sodium: 139 mmol/L (ref 135–146)

## 2023-03-06 ENCOUNTER — Encounter: Payer: Self-pay | Admitting: Family Medicine

## 2023-03-06 LAB — FECAL OCCULT BLOOD, IMMUNOCHEMICAL: Fecal Occult Bld: NEGATIVE

## 2023-03-27 DIAGNOSIS — H52229 Regular astigmatism, unspecified eye: Secondary | ICD-10-CM | POA: Diagnosis not present

## 2023-03-27 DIAGNOSIS — H2510 Age-related nuclear cataract, unspecified eye: Secondary | ICD-10-CM | POA: Diagnosis not present

## 2023-03-27 DIAGNOSIS — H524 Presbyopia: Secondary | ICD-10-CM | POA: Diagnosis not present

## 2023-03-27 DIAGNOSIS — H5213 Myopia, bilateral: Secondary | ICD-10-CM | POA: Diagnosis not present

## 2023-03-27 DIAGNOSIS — H5203 Hypermetropia, bilateral: Secondary | ICD-10-CM | POA: Diagnosis not present

## 2023-04-06 ENCOUNTER — Ambulatory Visit: Payer: Self-pay | Admitting: Family Medicine

## 2023-04-06 NOTE — Telephone Encounter (Signed)
 Copied from CRM [id number]. Topic: Clinical - Red Word Triage >> Apr 06, 2023  8:32 AM Dennison Nancy wrote: Red Word that prompted transfer to Nurse Triage: swelling in right big Toe - gout flareup

## 2023-04-06 NOTE — Telephone Encounter (Signed)
 Please assist patient with scheduling, thanks.

## 2023-04-06 NOTE — Telephone Encounter (Signed)
  Chief Complaint: R great toe pain, gout Symptoms: pain, swelling Frequency: 3 days Pertinent Negatives: Patient denies discoloration, fever, injury Disposition: [] ED /[] Urgent Care (no appt availability in office) / [x] Appointment(In office/virtual)/ []  [facility]/ [] Home Care/ [x] Refused Recommended Disposition /[] [place]/ []  Follow-up with PCP Additional Notes: Patient calls reporting gout flare up in R great toe x 3 days. Patient states he had a flare up apprx 2 weeks ago and was improving now has returned. Per protocol, patient to be evaluated within 4 hours. First available appointment with PCP or any provider in clinic outside of guidelines. Offered to schedule patient in another clinic, patient declined requesting PCP place order for medication. Care advice reviewed, patient verbalized understanding and denies further questions at this time. Alerting PCP for review.  Patient requesting return call to [phone]- states cell will block incoming calls.     Reason for Disposition  [1] SEVERE pain (e.g., excruciating, unable to do any normal activities) AND [2] not improved after 2 hours of pain medicine  Answer Assessment - Initial Assessment Questions 1. ONSET: "When did the pain start?"      3 days 2. LOCATION: "Where is the pain located?"   (e.g., around nail, entire toe, at foot joint)      R great toe 3. PAIN: "How bad is the pain?"    (Scale 1-10; or mild, moderate, severe)   -  MILD (1-3): doesn't interfere with normal activities    -  MODERATE (4-7): interferes with normal activities (e.g., work or school) or awakens from sleep, limping    -  SEVERE (8-10): excruciating pain, unable to do any normal activities, unable to walk     Resting 3/10, with weight 8/10 4. APPEARANCE: "What does the toe look like?" (e.g., redness, swelling, bruising, pallor)     Swollen around joint 5. CAUSE: "What do you think is causing the toe pain?"     Gout 6. OTHER  SYMPTOMS: "Do you have any other symptoms?" (e.g., leg pain, rash, fever, numbness)     Denies  Protocols used: Toe Pain-A-AH

## 2023-04-08 DIAGNOSIS — M10071 Idiopathic gout, right ankle and foot: Secondary | ICD-10-CM | POA: Diagnosis not present

## 2023-04-22 DIAGNOSIS — M10072 Idiopathic gout, left ankle and foot: Secondary | ICD-10-CM | POA: Diagnosis not present

## 2023-05-13 ENCOUNTER — Ambulatory Visit: Payer: Self-pay

## 2023-05-13 DIAGNOSIS — M10071 Idiopathic gout, right ankle and foot: Secondary | ICD-10-CM | POA: Diagnosis not present

## 2023-05-13 NOTE — Telephone Encounter (Signed)
 noted

## 2023-05-13 NOTE — Telephone Encounter (Signed)
 FYI, pt went to UC, see EMR note

## 2023-05-13 NOTE — Telephone Encounter (Signed)
 Copied from CRM [id number]. Topic: Clinical - Red Word Triage >> May 13, 2023  8:33 AM [place] A wrote: [facility] that prompted transfer to Nurse Triage: Patient has Gout in Right Foot 1-10;8  Chief Complaint: right foot pain and swelling; hx gout Symptoms: pain, swelling Frequency: constant Pertinent Negatives: Patient denies cp, fever,  Disposition: [] ED /[x] Urgent Care (no appt availability in office) / [] Appointment(In office/virtual)/ []  [place]/ [] Home Care/ [] Refused Recommended Disposition /[] [place] [facility]/ []  Follow-up with PCP Additional Notes: no apt's available; instructed to go to UC; care advice given, denies questions; instructed to go to ER if becomes worse.   Reason for Disposition  [1] SEVERE pain (e.g., excruciating, unable to do any normal activities) AND [2] not improved after 2 hours of pain medicine  Answer Assessment - Initial Assessment Questions 1. ONSET: "When did the pain start?"      Two days ago 2. LOCATION: "Where is the pain located?"      Right foot 3. PAIN: "How bad is the pain?"    (Scale 1-10; or mild, moderate, severe)  - MILD (1-3): doesn't interfere with normal activities.   - MODERATE (4-7): interferes with normal activities (e.g., work or school) or awakens from sleep, limping.   - SEVERE (8-10): excruciating pain, unable to do any normal activities, unable to walk.      8/10 4. WORK OR EXERCISE: "Has there been any recent work or exercise that involved this part of the body?"      denies 5. CAUSE: "What do you think is causing the foot pain?"     gout 6. OTHER SYMPTOMS: "Do you have any other symptoms?" (e.g., leg pain, rash, fever, numbness)     numbness 7. PREGNANCY: "Is there any chance you are pregnant?" "When was your last menstrual period?"     na  Protocols used: Foot Pain-A-AH

## 2023-05-15 ENCOUNTER — Ambulatory Visit (INDEPENDENT_AMBULATORY_CARE_PROVIDER_SITE_OTHER): Admitting: Family Medicine

## 2023-05-15 ENCOUNTER — Encounter: Payer: Self-pay | Admitting: Family Medicine

## 2023-05-15 VITALS — BP 136/70 | HR 83 | Ht 73.5 in

## 2023-05-15 DIAGNOSIS — E1121 Type 2 diabetes mellitus with diabetic nephropathy: Secondary | ICD-10-CM

## 2023-05-15 DIAGNOSIS — E119 Type 2 diabetes mellitus without complications: Secondary | ICD-10-CM

## 2023-05-15 DIAGNOSIS — Z7984 Long term (current) use of oral hypoglycemic drugs: Secondary | ICD-10-CM | POA: Diagnosis not present

## 2023-05-15 DIAGNOSIS — T380X5A Adverse effect of glucocorticoids and synthetic analogues, initial encounter: Secondary | ICD-10-CM | POA: Diagnosis not present

## 2023-05-15 DIAGNOSIS — M1A0710 Idiopathic chronic gout, right ankle and foot, without tophus (tophi): Secondary | ICD-10-CM | POA: Diagnosis not present

## 2023-05-15 DIAGNOSIS — N1831 Chronic kidney disease, stage 3a: Secondary | ICD-10-CM | POA: Diagnosis not present

## 2023-05-15 DIAGNOSIS — R739 Hyperglycemia, unspecified: Secondary | ICD-10-CM

## 2023-05-15 MED ORDER — FEBUXOSTAT 40 MG PO TABS
40.0000 mg | ORAL_TABLET | Freq: Every day | ORAL | 0 refills | Status: DC
Start: 2023-05-15 — End: 2023-06-02

## 2023-05-15 MED ORDER — COLCHICINE 0.6 MG PO TABS
ORAL_TABLET | ORAL | 0 refills | Status: AC
Start: 2023-05-15 — End: ?

## 2023-05-15 NOTE — Progress Notes (Signed)
 OFFICE VISIT  06/08/2023  CC:  Chief Complaint  Patient presents with   Foot Concern    Went to urgent care on 4/2; Right foot throughout - 3rd time in 6 weeks ; hx of gout   Patient is a 73 y.o. male who presents for ongoing problems with gout in his right foot. I last saw him on 03/04/2023. A/P as of that visit: "history of gouty arthritis.  He has about 2-3 flares per year, always in MTP joint of either foot. His episodes are usually pretty mild and short, not always requiring prednisone . He is not in favor of gout preventative medication at this time but if frequency of flares increase or if severity increases he is agreeable to getting on preventative medication.  Would start allopurinol at that time as long as renal function stayed around GFR of 50s. Also need to consider the option of getting him off the HCTZ b/c thiazides can increase the risk of gout.   He gets good response from a couple of days of prednisone  so I sent in a prescription today for him to have on hand. Low purine diet. He is trying cherry extract as well."  HPI: Unfortunately he has had multiple episodes of swelling and pain consistent with gout in the last couple of months.  Alternating right and left foot.  Has had to be on prednisone  much of this time.  Has not been on prednisone  in the last 2 days, though.  Also has been on colchicine .  None in the last 2 days. Currently without pain in either ankle or foot. No other joints have bothered him.  He has had some sugars in the 300-400 range lately on the steroids.  ROS as above, plus--> no fevers, no CP, no SOB, no wheezing, no cough, no dizziness, no HAs, no rashes, no melena/hematochezia.  No polyuria or polydipsia.  No myalgias.  No focal weakness, paresthesias, or tremors.  No edema. No n/v/d or abd pain.  No palpitations.     Past Medical History:  Diagnosis Date   Carpal tunnel syndrome on left    Cataract 07/2015   R eye--surgery recommended by his  eye MD 08/03/15   Chronic fatigue    Chronic renal insufficiency, stage 3 (moderate) (HCC) 2021   Cr 1.2-1.4, GFR 50s-60s   Closed fracture of greater tuberosity of right humerus 02/28/2019   Immobil w/sling (Dr. Agatha Horsfall)   Colon cancer screening 02/15/2018   iFOB neg 02/2018,  06/2020, 09/2021, and 02/2023   Community acquired pneumonia    06/2022 +sepsis--> hospitalization.  Needs follow-up CT with contrast chest in 3 months.   Erectile dysfunction    Dr. Isla Mari.  Testosterone  [facility] 04/2016 at [facility].   Gout of big toe 1990s   about 2 episodes per yr, pt favors no prev med   Herpes simplex ophthalmicus    L eye, has opthalmologist, uses prednisone  drops prn   History of gallstones    per pt report   History of kidney stones    History of prostatitis 1980s   Remote hx of seeing Dr. Milon Aloe at [facility]--then Dr. Isla Mari.    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 Dr. Steen Eden   Shingles    recurrent L thorax   Sinus disease    Thalassemia    Mild anemia with MCV 60s (alpha thal minor or beta thal trait: Hb  electrophoresis not done/no result available.).   Type 2 diabetes mellitus with microalbuminuria (HCC) Dx'd 08/2013    Past Surgical History:  Procedure Laterality Date   CHOLECYSTECTOMY N/A 01/26/2020   Procedure: LAPAROSCOPIC CHOLECYSTECTOMY;  Surgeon: Oza Blumenthal, MD;  Location: [facility];  Service: General;  Laterality: N/A;   NASAL POLYP EXCISION  1960   NASAL SINUS SURGERY  03/20/2022   WISDOM TOOTH EXTRACTION      Outpatient Medications Prior to Visit  Medication Sig Dispense Refill   acetaminophen  (TYLENOL ) 500 MG tablet Take 500-1,000 mg by mouth every 6 (six) hours as needed (pain.).     amLODipine  (NORVASC ) 10 MG tablet Take 1 tablet (10 mg total) by mouth daily. 90 tablet 3   atorvastatin  (LIPITOR) 20 MG tablet Take 1 tablet (20 mg total) by mouth  daily. 90 tablet 3   azelastine  (ASTELIN ) 0.1 % nasal spray Place 2 sprays into both nostrils 2 (two) times daily. Use in each nostril as directed     cetirizine (ZYRTEC) 10 MG tablet Take 10 mg by mouth daily.     fluticasone  (FLONASE ) 50 MCG/ACT nasal spray Place 2 sprays into both nostrils daily.     hydrochlorothiazide  (HYDRODIURIL ) 25 MG tablet 1 tab po qd     hydrocortisone  valerate ointment (WEST-CORT) 0.2 % Appy bid to affected area prn 15 g 2   lisinopril  (ZESTRIL ) 10 MG tablet Take 1 tablet (10 mg total) by mouth daily. 90 tablet 1   metFORMIN  (GLUCOPHAGE ) 1000 MG tablet Take 1 tablet (1,000 mg total) by mouth 2 (two) times daily with a meal. 180 tablet 2   pioglitazone  (ACTOS ) 45 MG tablet Take 1 tablet (45 mg total) by mouth daily. 90 tablet 3   sodium chloride  (OCEAN) 0.65 % SOLN nasal spray Place 1 spray into both nostrils as needed for congestion.     albuterol  (VENTOLIN  HFA) 108 (90 Base) MCG/ACT inhaler Inhale 2 puffs into the lungs every 6 (six) hours as needed for wheezing or shortness of breath. (Patient not taking: Reported on 06/02/2023) 8 g 2   prednisoLONE acetate (PRED FORTE) 1 % ophthalmic suspension Place 1 drop into both eyes daily as needed (eye irritation). (Patient not taking: Reported on 05/15/2023)     predniSONE  (DELTASONE ) 20 MG tablet 2 tabs every day x 5d (Patient not taking: Reported on 05/15/2023) 10 tablet 0   No facility-administered medications prior to visit.    No Known Allergies  Review of Systems  As per HPI  PE:    06/02/2023    8:40 AM 06/02/2023    8:37 AM 05/15/2023    3:35 PM  Vitals with BMI  Weight  252 lbs 3 oz   BMI  32.82   Systolic 126 151 161  Diastolic 71 80 70  Pulse  88      Physical Exam  Gen: Alert, well appearing.  Patient is oriented to person, place, time, and situation. AFFECT: pleasant, lucid thought and speech. Ankles and feet and toes without erythema, swelling, or tenderness.  Range of motion fully intact.  LABS:   Last CBC Lab Results  Component Value Date   WBC 5.4 12/02/2022   HGB 10.9 (L) 12/02/2022   HCT 35.6 (L) 12/02/2022   MCV 62.6 Wrong tube collected (L) 12/02/2022   MCH 19.1 (L) 09/01/2022   RDW 16.7 (H) 12/02/2022   PLT 223.0 12/02/2022   Last metabolic panel Lab Results  Component Value Date   GLUCOSE 160 (H) 06/02/2023  NA 142 06/02/2023   K 4.1 06/02/2023   CL 108 06/02/2023   CO2 25 06/02/2023   BUN 20 06/02/2023   CREATININE 1.26 06/02/2023   GFR 60.16 12/02/2022   CALCIUM  9.2 06/02/2023   PROT 6.5 06/02/2023   ALBUMIN 4.1 12/02/2022   BILITOT 1.0 06/02/2023   ALKPHOS 58 12/02/2022   AST 18 06/02/2023   ALT 18 06/02/2023   ANIONGAP 13 07/09/2022   Last hemoglobin A1c Lab Results  Component Value Date   HGBA1C 7.4 (H) 06/02/2023   Lab Results  Component Value Date   LABURIC 6.9 06/02/2023   IMPRESSION AND PLAN:  #1 chronic gouty arthritis. Prednisone  x multiple rounds lately. Fortunately, he also responds pretty good to colchicine  so we will do our best to keep him off the prednisone  in the future.  Will avoid allopurinol since his renal function is borderline. Will start Uloric  40 mg a day and have him take colchicine  0.6 mg twice daily for 10 days. No steroids at this time. Will recheck uric acid when he follows up this month on the 22nd.  #2 chronic renal insufficiency stage IIIa. Monitor basic metabolic panel today.  #3  hyperglycemia. DM 2+ steroid-induced. Should gradually improve now that he is off prednisone . Continue metformin  1000 mg twice a day and pioglitazone  45 mg a day. Next A1c at follow-up on 06/02/2023.  An After Visit Summary was printed and given to the patient.  FOLLOW UP: Return for keep appt set for 06/02/23.  Signed:  Arletha Lady, MD           06/08/2023

## 2023-05-16 LAB — BASIC METABOLIC PANEL WITH GFR
BUN/Creatinine Ratio: 16 (calc) (ref 6–22)
BUN: 21 mg/dL (ref 7–25)
CO2: 25 mmol/L (ref 20–32)
Calcium: 9.1 mg/dL (ref 8.6–10.3)
Chloride: 104 mmol/L (ref 98–110)
Creat: 1.29 mg/dL — ABNORMAL HIGH (ref 0.70–1.28)
Glucose, Bld: 153 mg/dL — ABNORMAL HIGH (ref 65–99)
Potassium: 3.9 mmol/L (ref 3.5–5.3)
Sodium: 139 mmol/L (ref 135–146)
eGFR: 58 mL/min/{1.73_m2} — ABNORMAL LOW (ref >=60)

## 2023-05-17 ENCOUNTER — Encounter: Payer: Self-pay | Admitting: Family Medicine

## 2023-06-02 ENCOUNTER — Encounter: Payer: Self-pay | Admitting: Family Medicine

## 2023-06-02 ENCOUNTER — Ambulatory Visit (INDEPENDENT_AMBULATORY_CARE_PROVIDER_SITE_OTHER): Payer: Medicare HMO | Admitting: Family Medicine

## 2023-06-02 VITALS — BP 126/71 | HR 88 | Temp 98.2°F | Wt 252.2 lb

## 2023-06-02 DIAGNOSIS — N1831 Chronic kidney disease, stage 3a: Secondary | ICD-10-CM | POA: Diagnosis not present

## 2023-06-02 DIAGNOSIS — E1121 Type 2 diabetes mellitus with diabetic nephropathy: Secondary | ICD-10-CM

## 2023-06-02 DIAGNOSIS — E7800 Pure hypercholesterolemia, unspecified: Secondary | ICD-10-CM

## 2023-06-02 DIAGNOSIS — M1A09X0 Idiopathic chronic gout, multiple sites, without tophus (tophi): Secondary | ICD-10-CM

## 2023-06-02 DIAGNOSIS — I10 Essential (primary) hypertension: Secondary | ICD-10-CM

## 2023-06-02 MED ORDER — FEBUXOSTAT 40 MG PO TABS: 40.0000 mg | ORAL_TABLET | Freq: Every day | ORAL | 3 refills | Status: DC

## 2023-06-02 NOTE — Progress Notes (Signed)
 OFFICE VISIT  06/02/2023  CC:  Chief Complaint  Patient presents with   Diabetes    Patient is a 73 y.o. male who presents for 69-month follow-up diabetes, hypertension, chronic renal insufficiency, and gout.  INTERIM HX: He had a couple of recurrences of gout and I ended up putting him on Uloric  40 mg a day at his last visit a few weeks ago. His glucoses had risen significantly while taking prednisone .  BP at home consistently 130/80 or better.  ROS: no fever, no myalgis, no rash, no headaches, no polyuria or polydipsia, no lower extremity swelling, no vision abnormalities, no chest pain or shortness of breath.  Past Medical History:  Diagnosis Date   Carpal tunnel syndrome on left    Cataract 07/2015   R eye--surgery recommended by his eye MD 08/03/15   Chronic fatigue    Chronic renal insufficiency, stage 3 (moderate) (HCC) 2021   Cr 1.2-1.4, GFR 50s-60s   Closed fracture of greater tuberosity of right humerus 02/28/2019   Immobil w/sling (Dr. Agatha Horsfall)   Colon cancer screening 02/15/2018   iFOB neg 02/2018,  06/2020, 09/2021, and 02/2023   Community acquired pneumonia    06/2022 +sepsis--> hospitalization.  Needs follow-up CT with contrast chest in 3 months.   Erectile dysfunction    Dr. Isla Mari.  Testosterone  [facility] 04/2016 at [facility].   Gout of big toe 1990s   about 2 episodes per yr, pt favors no prev med   Herpes simplex ophthalmicus    L eye, has opthalmologist, uses prednisone  drops prn   History of gallstones    per pt report   History of kidney stones    History of prostatitis 1980s   Remote hx of seeing Dr. Milon Aloe at [facility]--then Dr. Isla Mari.    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 Dr. Steen Eden   Shingles    recurrent L thorax   Sinus disease    Thalassemia    Mild anemia with MCV 60s (alpha thal minor or beta thal trait: Hb electrophoresis not  done/no result available.).   Type 2 diabetes mellitus with microalbuminuria (HCC) Dx'd 08/2013    Past Surgical History:  Procedure Laterality Date   CHOLECYSTECTOMY N/A 01/26/2020   Procedure: LAPAROSCOPIC CHOLECYSTECTOMY;  Surgeon: Oza Blumenthal, MD;  Location: [facility];  Service: General;  Laterality: N/A;   NASAL POLYP EXCISION  1960   NASAL SINUS SURGERY  03/20/2022   WISDOM TOOTH EXTRACTION      Outpatient Medications Prior to Visit  Medication Sig Dispense Refill   acetaminophen  (TYLENOL ) 500 MG tablet Take 500-1,000 mg by mouth every 6 (six) hours as needed (pain.).     albuterol  (VENTOLIN  HFA) 108 (90 Base) MCG/ACT inhaler Inhale 2 puffs into the lungs every 6 (six) hours as needed for wheezing or shortness of breath. (Patient not taking: Reported on 06/02/2023) 8 g 2   amLODipine  (NORVASC ) 10 MG tablet Take 1 tablet (10 mg total) by mouth daily. 90 tablet 3   atorvastatin  (LIPITOR) 20 MG tablet Take 1 tablet (20 mg total) by mouth daily. 90 tablet 3   azelastine  (ASTELIN ) 0.1 % nasal spray Place 2 sprays into both nostrils 2 (two) times daily. Use in each nostril as directed     cetirizine (ZYRTEC) 10 MG tablet Take 10 mg by mouth daily.     colchicine  0.6 MG tablet 1 tab po bid x 7d  then 1 tab po every day x 7d 21 tablet 0   febuxostat  (ULORIC ) 40 MG tablet Take 1 tablet (40 mg total) by mouth daily. 30 tablet 0   fluticasone  (FLONASE ) 50 MCG/ACT nasal spray Place 2 sprays into both nostrils daily.     hydrochlorothiazide  (HYDRODIURIL ) 25 MG tablet 1 tab po qd     hydrocortisone  valerate ointment (WEST-CORT) 0.2 % Appy bid to affected area prn 15 g 2   lisinopril  (ZESTRIL ) 10 MG tablet Take 1 tablet (10 mg total) by mouth daily. 90 tablet 1   metFORMIN  (GLUCOPHAGE ) 1000 MG tablet Take 1 tablet (1,000 mg total) by mouth 2 (two) times daily with a meal. 180 tablet 2   pioglitazone  (ACTOS ) 45 MG tablet Take 1 tablet (45 mg total) by mouth daily. 90 tablet 3   prednisoLONE  acetate (PRED FORTE) 1 % ophthalmic suspension Place 1 drop into both eyes daily as needed (eye irritation). (Patient not taking: Reported on 05/15/2023)     predniSONE  (DELTASONE ) 20 MG tablet 2 tabs every day x 5d (Patient not taking: Reported on 05/15/2023) 10 tablet 0   sodium chloride  (OCEAN) 0.65 % SOLN nasal spray Place 1 spray into both nostrils as needed for congestion.     No facility-administered medications prior to visit.    No Known Allergies  Review of Systems As per HPI  PE:    06/02/2023    8:40 AM 06/02/2023    8:37 AM 05/15/2023    3:35 PM  Vitals with BMI  Weight  252 lbs 3 oz   BMI  32.82   Systolic 126 151 161  Diastolic 71 80 70  Pulse  88      Physical Exam  Gen: Alert, well appearing.  Patient is oriented to person, place, time, and situation. AFFECT: pleasant, lucid thought and speech. Ankles and feet and toes without swelling, erythema, warmth, or tenderness.  LABS:  Last CBC Lab Results  Component Value Date   WBC 5.4 12/02/2022   HGB 10.9 (L) 12/02/2022   HCT 35.6 (L) 12/02/2022   MCV 62.6 Wrong tube collected (L) 12/02/2022   MCH 19.1 (L) 09/01/2022   RDW 16.7 (H) 12/02/2022   PLT 223.0 12/02/2022   Last metabolic panel Lab Results  Component Value Date   GLUCOSE 153 (H) 05/15/2023   NA 139 05/15/2023   K 3.9 05/15/2023   CL 104 05/15/2023   CO2 25 05/15/2023   BUN 21 05/15/2023   CREATININE 1.29 (H) 05/15/2023   EGFR 58 (L) 05/15/2023   CALCIUM  9.1 05/15/2023   PROT 6.6 12/02/2022   ALBUMIN 4.1 12/02/2022   BILITOT 1.1 12/02/2022   ALKPHOS 58 12/02/2022   AST 20 12/02/2022   ALT 13 12/02/2022   ANIONGAP 13 07/09/2022   Last lipids Lab Results  Component Value Date   CHOL 117 12/02/2022   HDL 44.40 12/02/2022   LDLCALC 54 12/02/2022   TRIG 97.0 12/02/2022   CHOLHDL 3 12/02/2022   Last hemoglobin A1c Lab Results  Component Value Date   HGBA1C 7.8 (A) 03/04/2023   HGBA1C 7.8 03/04/2023   HGBA1C 7.8 (A) 03/04/2023    HGBA1C 7.8 (A) 03/04/2023   Lab Results  Component Value Date   LABURIC 11.3 (H) 02/13/2023   IMPRESSION AND PLAN:  #1 chronic gout. Doing well on Uloric  40 mg a day. He will use colchicine  0.6 mg tabs as needed for acute flare. Check uric acid level today.  #2 DM with nephropathy. Control  improving since off prednisone . Hba1c today is 7.4% (down from 7.8% last check).  #3 Hypertension, good control on amlodipine  10mg  every day and 25mg  hydrochlorothiazide  every day, and lisinopril  10mg  every day. Lytes/cr today.  #4 chronic renal insufficiency stage III. He avoids NSAIDs. Lytes normal and GFR stable at 16ml/min 3 wks ago.  An After Visit Summary was printed and given to the patient.  FOLLOW UP: No follow-ups on file. Next CPE 11/2023 Signed:  Arletha Lady, MD           06/02/2023

## 2023-06-03 ENCOUNTER — Encounter: Payer: Self-pay | Admitting: Family Medicine

## 2023-06-03 LAB — COMPREHENSIVE METABOLIC PANEL WITH GFR
AG Ratio: 1.7 (calc) (ref 1.0–2.5)
ALT: 18 U/L (ref 9–46)
AST: 18 U/L (ref 10–35)
Albumin: 4.1 g/dL (ref 3.6–5.1)
Alkaline phosphatase (APISO): 60 U/L (ref 35–144)
BUN: 20 mg/dL (ref 7–25)
CO2: 25 mmol/L (ref 20–32)
Calcium: 9.2 mg/dL (ref 8.6–10.3)
Chloride: 108 mmol/L (ref 98–110)
Creat: 1.26 mg/dL (ref 0.70–1.28)
Globulin: 2.4 g/dL (ref 1.9–3.7)
Glucose, Bld: 160 mg/dL — ABNORMAL HIGH (ref 65–99)
Potassium: 4.1 mmol/L (ref 3.5–5.3)
Sodium: 142 mmol/L (ref 135–146)
Total Bilirubin: 1 mg/dL (ref 0.2–1.2)
Total Protein: 6.5 g/dL (ref 6.1–8.1)
eGFR: 59 mL/min/{1.73_m2} — ABNORMAL LOW (ref >=60)

## 2023-06-03 LAB — LIPID PANEL
Cholesterol: 127 mg/dL (ref <200)
HDL: 39 mg/dL — ABNORMAL LOW (ref >=40)
LDL Cholesterol (Calc): 64 mg/dL
Non-HDL Cholesterol (Calc): 88 mg/dL (ref <130)
Total CHOL/HDL Ratio: 3.3 (calc) (ref <5.0)
Triglycerides: 166 mg/dL — ABNORMAL HIGH (ref <150)

## 2023-06-03 LAB — HEMOGLOBIN A1C
Hgb A1c MFr Bld: 7.4 % — ABNORMAL HIGH (ref <5.7)
Mean Plasma Glucose: 166 mg/dL
eAG (mmol/L): 9.2 mmol/L

## 2023-06-03 LAB — URIC ACID: Uric Acid, Serum: 6.9 mg/dL (ref 4.0–8.0)

## 2023-06-05 ENCOUNTER — Telehealth: Payer: Self-pay | Admitting: Family Medicine

## 2023-06-05 NOTE — Telephone Encounter (Signed)
 Copied from CRM [id number]. Topic: Clinical - Prescription Issue >> Jun 05, 2023  2:33 PM Jenice Mitts wrote: Reason for CRM: Patients wife Marily Shows is calling because they received a call from [facility] about the febuxostat  (ULORIC ) 40 MG tablet prescription.  [facility] to clarify some information on the prescription in order to fill it

## 2023-06-09 NOTE — Telephone Encounter (Signed)
 [facility] has sent a PA request to our office for completion. Pt was made aware of this update.

## 2023-06-29 ENCOUNTER — Other Ambulatory Visit: Payer: Self-pay | Admitting: Family Medicine

## 2023-06-29 NOTE — Telephone Encounter (Signed)
 Copied from CRM [id number]. Topic: Clinical - Medication Refill >> Jun 29, 2023  4:24 PM Zachary Chandler wrote: Medication: lisinopril  (ZESTRIL ) 10 MG tablet  Has the patient contacted their pharmacy? Yes (Agent: If no, request that the patient contact the pharmacy for the refill. If patient does not wish to contact the pharmacy document the reason why and proceed with request.) (Agent: If yes, when and what did the pharmacy advise?)  This is the patient's preferred pharmacy:  [facility] - [place], [place] - [address] [place] [place] [address] Phone: [phone]  Fax: [phone]    Is this the correct pharmacy for this prescription? Yes If no, delete pharmacy and type the correct one.   Has the prescription been filled recently? No  Is the patient out of the medication? Yes  Has the patient been seen for an appointment in the last year OR does the patient have an upcoming appointment? Yes  Can we respond through MyChart? Yes  Agent: Please be advised that Rx refills may take up to 3 business days. We ask that you follow-up with your pharmacy.

## 2023-06-30 MED ORDER — LISINOPRIL 10 MG PO TABS: 10.0000 mg | ORAL_TABLET | Freq: Every day | ORAL | 0 refills | Status: DC

## 2023-07-24 ENCOUNTER — Encounter: Payer: Self-pay | Admitting: Family Medicine

## 2023-07-24 ENCOUNTER — Ambulatory Visit (INDEPENDENT_AMBULATORY_CARE_PROVIDER_SITE_OTHER): Admitting: Family Medicine

## 2023-07-24 VITALS — BP 133/73 | HR 74 | Temp 98.6°F | Ht 73.5 in | Wt 252.2 lb

## 2023-07-24 DIAGNOSIS — M1A09X0 Idiopathic chronic gout, multiple sites, without tophus (tophi): Secondary | ICD-10-CM

## 2023-07-24 MED ORDER — FEBUXOSTAT 80 MG PO TABS: ORAL_TABLET | ORAL | 1 refills | Status: DC

## 2023-07-24 MED ORDER — COLCHICINE 0.6 MG PO TABS: ORAL_TABLET | ORAL | 6 refills | Status: AC

## 2023-07-24 NOTE — Progress Notes (Signed)
 OFFICE VISIT  08/24/2023  CC:  Chief Complaint  Patient presents with   Gout    Flare; started 2 weeks ago, has taken Colchicine  and Uloric , ran out of Colchicine  Monday. About 10 days ago, had flare up in both feet    Patient is a 73 y.o. male who presents for concern of gout flare.  INTERIM HX: About a week  ago he started having bilateral ankle pain and great toe pain consistent with his typical gout flare.  Right foot a little worse than left.  Getting a little better the last couple of days.  He has had slight improvement since getting on Uloric  but still has flare every 2 to 3 weeks.  Colchicine  almost always helps but he ran out of it about 10 days ago.  Prednisone  has caused him remarkably increased glucoses in the past.  Blood pressure has been controlled on Norvasc  10 mg a day, lisinopril  10 mg a day, and HCTZ one half of a 25 mg tab a day.  Review of systems: No fever, no abnormal weight loss, no headaches, no dizziness,  Past Medical History:  Diagnosis Date   Carpal tunnel syndrome on left    Cataract 07/2015   R eye--surgery recommended by his eye MD 08/03/15   Chronic fatigue    Chronic renal insufficiency, stage 3 (moderate) (HCC) 2021   Cr 1.2-1.4, GFR 50s-60s   Closed fracture of greater tuberosity of right humerus 02/28/2019   Immobil w/sling (Dr. Josefina)   Colon cancer screening 02/15/2018   iFOB neg 02/2018,  06/2020, 09/2021, and 02/2023   Community acquired pneumonia    06/2022 +sepsis--> hospitalization.  Needs follow-up CT with contrast chest in 3 months.   Erectile dysfunction    Dr. Chales.  Testosterone  normal 04/2016 at [facility].   Gout of big toe 1990s   about 2 episodes per yr, pt favors no prev med   Herpes simplex ophthalmicus    L eye, has opthalmologist, uses prednisone  drops prn   History of gallstones    per pt report   History of kidney stones    History of prostatitis 1980s   Remote hx of seeing Dr. Andra at [facility]--then Dr.  Chales.    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 Dr. Livingston   Shingles    recurrent L thorax   Sinus disease    Thalassemia    Mild anemia with MCV 60s (alpha thal minor or beta thal trait: Hb electrophoresis not done/no result available.).   Type 2 diabetes mellitus with microalbuminuria (HCC) Dx'd 08/2013    Past Surgical History:  Procedure Laterality Date   CHOLECYSTECTOMY N/A 01/26/2020   Procedure: LAPAROSCOPIC CHOLECYSTECTOMY;  Surgeon: Vernetta Berg, MD;  Location: [facility];  Service: General;  Laterality: N/A;   NASAL POLYP EXCISION  1960   NASAL SINUS SURGERY  03/20/2022   WISDOM TOOTH EXTRACTION      Outpatient Medications Prior to Visit  Medication Sig Dispense Refill   acetaminophen  (TYLENOL ) 500 MG tablet Take 500-1,000 mg by mouth every 6 (six) hours as needed (pain.).     azelastine  (ASTELIN ) 0.1 % nasal spray Place 2 sprays into both nostrils 2 (two) times daily. Use in each nostril as directed     cetirizine (ZYRTEC) 10 MG tablet Take 10 mg by mouth daily.     fluticasone  (FLONASE ) 50 MCG/ACT nasal spray Place 2  sprays into both nostrils daily.     hydrocortisone  valerate ointment (WEST-CORT) 0.2 % Appy bid to affected area prn 15 g 2   lisinopril  (ZESTRIL ) 10 MG tablet Take 1 tablet (10 mg total) by mouth daily. 90 tablet 0   prednisoLONE acetate (PRED FORTE) 1 % ophthalmic suspension Place 1 drop into both eyes daily as needed (eye irritation).     sodium chloride  (OCEAN) 0.65 % SOLN nasal spray Place 1 spray into both nostrils as needed for congestion.     amLODipine  (NORVASC ) 10 MG tablet Take 1 tablet (10 mg total) by mouth daily. 90 tablet 3   atorvastatin  (LIPITOR) 20 MG tablet Take 1 tablet (20 mg total) by mouth daily. 90 tablet 3   colchicine  0.6 MG tablet 1 tab po bid x 7d then 1 tab po every day x 7d 21 tablet 0   febuxostat  (ULORIC ) 40 MG  tablet Take 1 tablet (40 mg total) by mouth daily. 90 tablet 3   hydrochlorothiazide  (HYDRODIURIL ) 25 MG tablet 1 tab po qd     metFORMIN  (GLUCOPHAGE ) 1000 MG tablet Take 1 tablet (1,000 mg total) by mouth 2 (two) times daily with a meal. 180 tablet 2   pioglitazone  (ACTOS ) 45 MG tablet Take 1 tablet (45 mg total) by mouth daily. 90 tablet 3   albuterol  (VENTOLIN  HFA) 108 (90 Base) MCG/ACT inhaler Inhale 2 puffs into the lungs every 6 (six) hours as needed for wheezing or shortness of breath. (Patient not taking: Reported on 08/05/2023) 8 g 2   predniSONE  (DELTASONE ) 20 MG tablet 2 tabs every day x 5d (Patient not taking: Reported on 05/15/2023) 10 tablet 0   No facility-administered medications prior to visit.    No Known Allergies  Review of Systems As per HPI  PE:    08/05/2023    8:55 AM 07/24/2023   11:04 AM 06/02/2023    8:40 AM  Vitals with BMI  Height 6' 1 6' 1.5   Weight 252 lbs 252 lbs 3 oz   BMI 33.25 32.82   Systolic  133 126  Diastolic  73 71  Pulse  74      Physical Exam  Gen: Alert, well appearing.  Patient is oriented to person, place, time, and situation. AFFECT: pleasant, lucid thought and speech. Mild swelling and tenderness of both ankles and both MCP joints.  Minimal discoloration of the skin over the MTP joints.  LABS:  Last CBC Lab Results  Component Value Date   WBC 5.4 12/02/2022   HGB 10.9 (L) 12/02/2022   HCT 35.6 (L) 12/02/2022   MCV 62.6 Wrong tube collected (L) 12/02/2022   MCH 19.1 (L) 09/01/2022   RDW 16.7 (H) 12/02/2022   PLT 223.0 12/02/2022   Last metabolic panel Lab Results  Component Value Date   GLUCOSE 160 (H) 06/02/2023   NA 142 06/02/2023   K 4.1 06/02/2023   CL 108 06/02/2023   CO2 25 06/02/2023   BUN 20 06/02/2023   CREATININE 1.26 06/02/2023   EGFR 59 (L) 06/02/2023   CALCIUM  9.2 06/02/2023   PROT 6.5 06/02/2023   ALBUMIN 4.1 12/02/2022   BILITOT 1.0 06/02/2023   ALKPHOS 58 12/02/2022   AST 18 06/02/2023   ALT  18 06/02/2023   ANIONGAP 13 07/09/2022   Lab Results  Component Value Date   LABURIC 6.9 06/02/2023   IMPRESSION AND PLAN:  #1 recurrent gouty arthritis in the feet and ankles. Increase Uloric  to 80 mg a day. Stop  HCTZ. Colchicine  prescription renewed. We will try to avoid prednisone  treatment because of marked hyperglycemia with past use.  An After Visit Summary was printed and given to the patient.  FOLLOW UP: Return in about 4 weeks (around 08/21/2023) for f/u gout, bp.  Signed:  Gerlene Hockey, MD           08/24/2023

## 2023-07-28 ENCOUNTER — Other Ambulatory Visit: Payer: Self-pay | Admitting: Pharmacist

## 2023-07-28 ENCOUNTER — Encounter: Payer: Self-pay | Admitting: Pharmacist

## 2023-07-28 DIAGNOSIS — I10 Essential (primary) hypertension: Secondary | ICD-10-CM

## 2023-07-28 NOTE — Progress Notes (Signed)
 Pharmacy Quality Measure Review  This patient is appearing on a report for being at risk of failing the adherence measure for cholesterol (statin) and diabetes medications this calendar year.   Medication: atorvastatin   Last fill date: 03/02/2023 for 90 day supply per adherence report. Patient will need an updated Rx - last Rx sent in was from 06/2022  Medication: pioglitazone   Last fill date: 03/02/2023 for 90 day supply. Patient will need an updated Rx - last Rx sent in was from 06/2022  Spoke with patient's wife. She reports he does need metformin , atorvastatin , pioglitazone  and amlodipine  prescriptions filled.  Collaborating with provider to facilitate refill needs.  Cecilie Coffee, PharmD Clinical Pharmacist [facility]  Population Health [phone]

## 2023-07-29 MED ORDER — METFORMIN HCL 1000 MG PO TABS: 1000.0000 mg | ORAL_TABLET | Freq: Two times a day (BID) | ORAL | 3 refills | Status: AC

## 2023-07-29 MED ORDER — AMLODIPINE BESYLATE 10 MG PO TABS: 10.0000 mg | ORAL_TABLET | Freq: Every day | ORAL | 3 refills | Status: AC

## 2023-07-29 MED ORDER — PIOGLITAZONE HCL 45 MG PO TABS: 45.0000 mg | ORAL_TABLET | Freq: Every day | ORAL | 3 refills | Status: AC

## 2023-07-29 MED ORDER — ATORVASTATIN CALCIUM 20 MG PO TABS: 20.0000 mg | ORAL_TABLET | Freq: Every day | ORAL | 3 refills | Status: AC

## 2023-08-05 ENCOUNTER — Ambulatory Visit: Payer: Medicare HMO

## 2023-08-05 VITALS — Ht 73.0 in | Wt 252.0 lb

## 2023-08-05 DIAGNOSIS — Z0000 Encounter for general adult medical examination without abnormal findings: Secondary | ICD-10-CM | POA: Diagnosis not present

## 2023-08-05 NOTE — Patient Instructions (Signed)
 Zachary Chandler , Thank you for taking time to come for your Medicare Wellness Visit. I appreciate your ongoing commitment to your health goals. Please review the following plan we discussed and let me know if I can assist you in the future.   Screening recommendations/referrals: Colonoscopy: no longer required Recommended yearly ophthalmology/optometry visit for glaucoma screening and checkup Recommended yearly dental visit for hygiene and checkup  Vaccinations: Influenza vaccine: up to date Pneumococcal vaccine: up to date Tdap vaccine: up to date Shingles vaccine: Education provided      Preventive Care 65 Years and Older, Male Preventive care refers to lifestyle choices and visits with your health care provider that can promote health and wellness. What does preventive care include? A yearly physical exam. This is also called an annual well check. Dental exams once or twice a year. Routine eye exams. Ask your health care provider how often you should have your eyes checked. Personal lifestyle choices, including: Daily care of your teeth and gums. Regular physical activity. Eating a healthy diet. Avoiding tobacco and drug use. Limiting alcohol use. Practicing safe sex. Taking low doses of aspirin  every day. Taking vitamin and mineral supplements as recommended by your health care provider. What happens during an annual well check? The services and screenings done by your health care provider during your annual well check will depend on your age, overall health, lifestyle risk factors, and family history of disease. Counseling  Your health care provider may ask you questions about your: Alcohol use. Tobacco use. Drug use. Emotional well-being. Home and relationship well-being. Sexual activity. Eating habits. History of falls. Memory and ability to understand (cognition). Work and work Astronomer. Screening  You may have the following tests or measurements: Height,  weight, and BMI. Blood pressure. Lipid and cholesterol levels. These may be checked every 5 years, or more frequently if you are over 75 years old. Skin check. Lung cancer screening. You may have this screening every year starting at age 75 if you have a 30-pack-year history of smoking and currently smoke or have quit within the past 15 years. Fecal occult blood test (FOBT) of the stool. You may have this test every year starting at age 75. Flexible sigmoidoscopy or colonoscopy. You may have a sigmoidoscopy every 5 years or a colonoscopy every 10 years starting at age 75. Prostate cancer screening. Recommendations will vary depending on your family history and other risks. Hepatitis C blood test. Hepatitis B blood test. Sexually transmitted disease (STD) testing. Diabetes screening. This is done by checking your blood sugar (glucose) after you have not eaten for a while (fasting). You may have this done every 1-3 years. Abdominal aortic aneurysm (AAA) screening. You may need this if you are a current or former smoker. Osteoporosis. You may be screened starting at age 75 if you are at high risk. Talk with your health care provider about your test results, treatment options, and if necessary, the need for more tests. Vaccines  Your health care provider may recommend certain vaccines, such as: Influenza vaccine. This is recommended every year. Tetanus, diphtheria, and acellular pertussis (Tdap, Td) vaccine. You may need a Td booster every 10 years. Zoster vaccine. You may need this after age 75. Pneumococcal 13-valent conjugate (PCV13) vaccine. One dose is recommended after age 75. Pneumococcal polysaccharide (PPSV23) vaccine. One dose is recommended after age 75. Talk to your health care provider about which screenings and vaccines you need and how often you need them. This information is not intended to  replace advice given to you by your health care provider. Make sure you discuss any  questions you have with your health care provider. Document Released: 02/23/2015 Document Revised: 10/17/2015 Document Reviewed: 11/28/2014 Elsevier Interactive Patient Education  2017 [employer].  Fall Prevention in the Home Falls can cause injuries. They can happen to people of all ages. There are many things you can do to make your home safe and to help prevent falls. What can I do on the outside of my home? Regularly fix the edges of walkways and driveways and fix any cracks. Remove anything that might make you trip as you walk through a door, such as a raised step or threshold. Trim any bushes or trees on the path to your home. Use bright outdoor lighting. Clear any walking paths of anything that might make someone trip, such as rocks or tools. Regularly check to see if handrails are loose or broken. Make sure that both sides of any steps have handrails. Any raised decks and porches should have guardrails on the edges. Have any leaves, snow, or ice cleared regularly. Use sand or salt on walking paths during winter. Clean up any spills in your garage right away. This includes oil or grease spills. What can I do in the bathroom? Use night lights. Install grab bars by the toilet and in the tub and shower. Do not use towel bars as grab bars. Use non-skid mats or decals in the tub or shower. If you need to sit down in the shower, use a plastic, non-slip stool. Keep the floor dry. Clean up any water that spills on the floor as soon as it happens. Remove soap buildup in the tub or shower regularly. Attach bath mats securely with double-sided non-slip rug tape. Do not have throw rugs and other things on the floor that can make you trip. What can I do in the bedroom? Use night lights. Make sure that you have a light by your bed that is easy to reach. Do not use any sheets or blankets that are too big for your bed. They should not hang down onto the floor. Have a firm chair that has side  arms. You can use this for support while you get dressed. Do not have throw rugs and other things on the floor that can make you trip. What can I do in the kitchen? Clean up any spills right away. Avoid walking on wet floors. Keep items that you use a lot in easy-to-reach places. If you need to reach something above you, use a strong step stool that has a grab bar. Keep electrical cords out of the way. Do not use floor polish or wax that makes floors slippery. If you must use wax, use non-skid floor wax. Do not have throw rugs and other things on the floor that can make you trip. What can I do with my stairs? Do not leave any items on the stairs. Make sure that there are handrails on both sides of the stairs and use them. Fix handrails that are broken or loose. Make sure that handrails are as long as the stairways. Check any carpeting to make sure that it is firmly attached to the stairs. Fix any carpet that is loose or worn. Avoid having throw rugs at the top or bottom of the stairs. If you do have throw rugs, attach them to the floor with carpet tape. Make sure that you have a light switch at the top of the stairs and the  bottom of the stairs. If you do not have them, ask someone to add them for you. What else can I do to help prevent falls? Wear shoes that: Do not have high heels. Have rubber bottoms. Are comfortable and fit you well. Are closed at the toe. Do not wear sandals. If you use a stepladder: Make sure that it is fully opened. Do not climb a closed stepladder. Make sure that both sides of the stepladder are locked into place. Ask someone to hold it for you, if possible. Clearly mark and make sure that you can see: Any grab bars or handrails. First and last steps. Where the edge of each step is. Use tools that help you move around (mobility aids) if they are needed. These include: Canes. Walkers. Scooters. Crutches. Turn on the lights when you go into a dark area.  Replace any light bulbs as soon as they burn out. Set up your furniture so you have a clear path. Avoid moving your furniture around. If any of your floors are uneven, fix them. If there are any pets around you, be aware of where they are. Review your medicines with your doctor. Some medicines can make you feel dizzy. This can increase your chance of falling. Ask your doctor what other things that you can do to help prevent falls. This information is not intended to replace advice given to you by your health care provider. Make sure you discuss any questions you have with your health care provider. Document Released: 11/23/2008 Document Revised: 07/05/2015 Document Reviewed: 03/03/2014 Elsevier Interactive Patient Education  2017 [employer].

## 2023-08-05 NOTE — Progress Notes (Signed)
 Subjective:   Zachary Chandler is a 73 y.o. male who presents for Medicare Annual/Subsequent preventive examination.  Visit Complete: Virtual I connected with  Dempsey DELENA Hedges on 08/05/23 by a audio enabled telemedicine application and verified that I am speaking with the correct person using two identifiers.  Patient Location: Home  Provider Location: Home Office  I discussed the limitations of evaluation and management by telemedicine. The patient expressed understanding and agreed to proceed.  Vital Signs: Because this visit was a virtual/telehealth visit, some criteria may be missing or patient reported. Any vitals not documented were not able to be obtained and vitals that have been documented are patient reported.   Cardiac Risk Factors include: advanced age (>67men, >18 women);hypertension;male gender;diabetes mellitus;family history of premature cardiovascular disease;obesity (BMI >30kg/m2)     Objective:    Today's Vitals   08/05/23 0855  Weight: 252 lb (114.3 kg)  Height: 6' 1 (1.854 m)   Body mass index is 33.25 kg/m.     08/05/2023    8:57 AM 07/30/2022    9:10 AM 07/08/2022    2:39 AM 07/07/2022    9:49 PM 08/28/2021   11:30 AM 08/22/2020    2:21 PM 01/23/2020   12:30 PM  Advanced Directives  Does Patient Have a Medical Advance Directive? No No  No Yes Yes No  Type of Advance Directive     Healthcare Power of [employer] Power of Attorney   Does patient want to make changes to medical advance directive?      No - Patient declined   Copy of Healthcare Power of Attorney in Chart?     No - copy requested    Would patient like information on creating a medical advance directive? No - Patient declined No - Patient declined No - Patient declined   No - Patient declined     Current Medications (verified) Outpatient Encounter Medications as of 08/05/2023  Medication Sig   acetaminophen  (TYLENOL ) 500 MG tablet Take 500-1,000 mg by mouth every 6 (six) hours as  needed (pain.).   amLODipine  (NORVASC ) 10 MG tablet Take 1 tablet (10 mg total) by mouth daily.   atorvastatin  (LIPITOR) 20 MG tablet Take 1 tablet (20 mg total) by mouth daily.   azelastine  (ASTELIN ) 0.1 % nasal spray Place 2 sprays into both nostrils 2 (two) times daily. Use in each nostril as directed   cetirizine (ZYRTEC) 10 MG tablet Take 10 mg by mouth daily.   colchicine  0.6 MG tablet 2 tabs at onset of gout flare, then 1 tab one hour later, then start 1 tab bid until flare is resolved   Febuxostat  (ULORIC ) 80 MG TABS 1 tab po qd   fluticasone  (FLONASE ) 50 MCG/ACT nasal spray Place 2 sprays into both nostrils daily.   hydrocortisone  valerate ointment (WEST-CORT) 0.2 % Appy bid to affected area prn   lisinopril  (ZESTRIL ) 10 MG tablet Take 1 tablet (10 mg total) by mouth daily.   metFORMIN  (GLUCOPHAGE ) 1000 MG tablet Take 1 tablet (1,000 mg total) by mouth 2 (two) times daily with a meal.   pioglitazone  (ACTOS ) 45 MG tablet Take 1 tablet (45 mg total) by mouth daily.   prednisoLONE acetate (PRED FORTE) 1 % ophthalmic suspension Place 1 drop into both eyes daily as needed (eye irritation).   sodium chloride  (OCEAN) 0.65 % SOLN nasal spray Place 1 spray into both nostrils as needed for congestion.   albuterol  (VENTOLIN  HFA) 108 (90 Base) MCG/ACT inhaler Inhale 2 puffs  into the lungs every 6 (six) hours as needed for wheezing or shortness of breath. (Patient not taking: Reported on 08/05/2023)   No facility-administered encounter medications on file as of 08/05/2023.    Allergies (verified) Patient has no known allergies.   History: Past Medical History:  Diagnosis Date   Carpal tunnel syndrome on left    Cataract 07/2015   R eye--surgery recommended by his eye MD 08/03/15   Chronic fatigue    Chronic renal insufficiency, stage 3 (moderate) (HCC) 2021   Cr 1.2-1.4, GFR 50s-60s   Closed fracture of greater tuberosity of right humerus 02/28/2019   Immobil w/sling (Dr. Josefina)   Colon  cancer screening 02/15/2018   iFOB neg 02/2018,  06/2020, 09/2021, and 02/2023   Community acquired pneumonia    06/2022 +sepsis--> hospitalization.  Needs follow-up CT with contrast chest in 3 months.   Erectile dysfunction    Dr. Chales.  [facility] 04/2016 [facility].   Gout of big toe 1990s   about 2 episodes per yr, pt favors no prev med   Herpes simplex ophthalmicus    L eye, has opthalmologist, uses prednisone  drops prn   History of gallstones    per pt report   History of kidney stones    History of prostatitis 1980s   Remote hx of seeing Dr. Andra at [facility]--then Dr. Chales.    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 Dr. Livingston   Shingles    recurrent L thorax   Sinus disease    Thalassemia    Mild anemia with MCV 60s (alpha thal minor or beta thal trait: Hb electrophoresis not done/no result available.).   Type 2 diabetes mellitus with microalbuminuria (HCC) Dx'd 08/2013   Past Surgical History:  Procedure Laterality Date   CHOLECYSTECTOMY N/A 01/26/2020   Procedure: LAPAROSCOPIC CHOLECYSTECTOMY;  Surgeon: Vernetta Berg, MD;  Location: [facility];  Service: General;  Laterality: N/A;   NASAL POLYP EXCISION  1960   NASAL SINUS SURGERY  03/20/2022   WISDOM TOOTH EXTRACTION     Family History  Problem Relation Age of Onset   Heart disease Mother    Hypertension Mother    Diabetes Mother    Heart disease Father    Heart attack Sister    Social History   Socioeconomic History   Marital status: Married    Spouse name: Not on file   Number of children: Not on file   Years of education: Not on file   Highest education level: Some college, no degree  Occupational History   Not on file  Tobacco Use   Smoking status: Former    Current packs/day: 0.00    Types: Cigarettes    Quit date: 02/28/1970    Years since quitting: 53.4   Smokeless  tobacco: Never  Vaping Use   Vaping status: Never Used  Substance and Sexual Activity   Alcohol use: No    Alcohol/week: 0.0 standard drinks of alcohol   Drug use: No   Sexual activity: Not Currently  Other Topics Concern   Not on file  Social History Narrative   Married, has one 64 y/o son.   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     Orig from [place].   Was in [phone].  No combat duty.  No VA care.   Smoked 10-12 yrs, quit in his early  20s.   Alcohol: none   No hx of drugs.            Social Drivers of Corporate investment banker Strain: Low Risk  (08/05/2023)   Overall Financial Resource Strain (CARDIA)    Difficulty of Paying Living Expenses: Not hard at all  Food Insecurity: No Food Insecurity (08/05/2023)   Hunger Vital Sign    Worried About Running Out of Food in the Last Year: Never true    Ran Out of Food in the Last Year: Never true  Transportation Needs: No Transportation Needs (08/05/2023)   PRAPARE - Administrator, Civil Service (Medical): No    Lack of Transportation (Non-Medical): No  Physical Activity: Inactive (08/05/2023)   Exercise Vital Sign    Days of Exercise per Week: 0 days    Minutes of Exercise per Session: 0 min  Stress: No Stress Concern Present (08/05/2023)   [employer] of Occupational Health - Occupational Stress Questionnaire    Feeling of Stress: Not at all  Social Connections: Socially Integrated (08/05/2023)   Social Connection and Isolation Panel    Frequency of Communication with Friends and Family: Never    Frequency of Social Gatherings with Friends and Family: More than three times a week    Attends Religious Services: More than 4 times per year    Active Member of [employer] or Organizations: Yes    Attends Engineer, structural: More than 4 times per year    Marital Status: Married    Tobacco Counseling Counseling given: Not Answered   Clinical Intake:  Pre-visit preparation  completed: Yes  Pain : No/denies pain     Diabetes: Yes CBG done?: No Did pt. bring in CBG monitor from home?: No  How often do you need to have someone help you when you read instructions, pamphlets, or other written materials from your doctor or pharmacy?: 1 - Never  Interpreter Needed?: No  Information entered by :: Mliss Graff LPN   Activities of Daily Living    08/05/2023    8:57 AM  In your present state of health, do you have any difficulty performing the following activities:  Hearing? 0  Vision? 0  Difficulty concentrating or making decisions? 0  Walking or climbing stairs? 0  Dressing or bathing? 0  Doing errands, shopping? 0  Preparing Food and eating ? N  Using the Toilet? N  In the past six months, have you accidently leaked urine? N  Do you have problems with loss of bowel control? N  Managing your Medications? N  Managing your Finances? N  Housekeeping or managing your Housekeeping? N    Patient Care Team: Candise Aleene DEL, MD as PCP - General (Family Medicine) Chales Idol, MD as Consulting Physician (Urology) Josefina Chew, MD as Consulting Physician (Orthopedic Surgery) Vernetta Berg, MD as Consulting Physician (General Surgery) Livingston Rigg, MD (Inactive) as Consulting Physician (Dermatology) Vicci Mcardle, OD (Optometry)  Indicate any recent Medical Services you may have received from other than [facility] providers in the past year (date may be approximate).     Assessment:   This is a routine wellness examination for Branden.  Hearing/Vision screen Hearing Screening - Comments:: No trouble hearing Vision Screening - Comments:: Up to date Switching eye doctors   Goals Addressed               This Visit's Progress     patient (pt-stated)   On track  Maintain current health.       Patient Stated   On track     Maintain current health      Patient Stated   On track     None at this time       Patient Stated   Not on  track     Get more active       Patient Stated        Continue current lifestyle       Depression Screen    08/05/2023    9:01 AM 06/02/2023    8:41 AM 08/28/2022    8:55 AM 07/31/2022   10:34 AM 07/30/2022    9:09 AM 01/09/2022    8:14 AM 10/08/2021    9:14 AM  PHQ 2/9 Scores  PHQ - 2 Score 0 0 0 0 0 0 0  PHQ- 9 Score 2  3        Fall Risk    08/05/2023    8:55 AM 06/02/2023    8:41 AM 08/28/2022    8:55 AM 07/31/2022   10:30 AM 07/30/2022    9:11 AM  Fall Risk   Falls in the past year? 0 0 0 0 0  Number falls in past yr: 0 0   0  Injury with Fall? 0 0   0  Risk for fall due to :   No Fall Risks  Impaired balance/gait;Impaired vision  Follow up Falls evaluation completed;Education provided;Falls prevention discussed Falls evaluation completed Falls evaluation completed  Falls prevention discussed    MEDICARE RISK AT HOME: Medicare Risk at Home Any stairs in or around the home?: No If so, are there any without handrails?: No Home free of loose throw rugs in walkways, pet beds, electrical cords, etc?: Yes Adequate lighting in your home to reduce risk of falls?: Yes Life alert?: No Use of a cane, walker or w/c?: No Grab bars in the bathroom?: No Shower chair or bench in shower?: No Elevated toilet seat or a handicapped toilet?: No  TIMED UP AND GO:  Was the test performed?  No    Cognitive Function:        08/05/2023    8:59 AM 07/30/2022    9:12 AM 08/28/2021   11:33 AM  6CIT Screen  What Year? 0 points 0 points 0 points  What month? 0 points 0 points 0 points  What time? 0 points 0 points 0 points  Count back from 20 0 points 0 points 0 points  Months in reverse 0 points 0 points 0 points  Repeat phrase 0 points 0 points 0 points  Total Score 0 points 0 points 0 points    Immunizations Immunization History  Administered Date(s) Administered   PFIZER Comirnaty(Gray Top)Covid-19 Tri-Sucrose Vaccine 08/06/2020   PFIZER(Purple Top)SARS-COV-2 Vaccination  11/01/2019, 11/22/2019   Pneumococcal Conjugate-13 09/20/2013   Pneumococcal Polysaccharide-23 01/08/2015   Tdap 01/07/2016    TDAP status: Up to date  Flu Vaccine status: Up to date  Pneumococcal vaccine status: Up to date  Covid-19 vaccine status: Information provided on how to obtain vaccines.   Qualifies for Shingles Vaccine? Yes   Zostavax completed No   Shingrix Completed?: No.    Education has been provided regarding the importance of this vaccine. Patient has been advised to call insurance company to determine out of pocket expense if they have not yet received this vaccine. Advised may also receive vaccine at local pharmacy or Health Dept. Verbalized acceptance and understanding.  Screening Tests Health Maintenance  Topic Date Due   Diabetic kidney evaluation - Urine ACR  08/28/2023   COVID-19 Vaccine (4 - 2024-25 season) 08/09/2023 (Originally 10/12/2022)   Zoster Vaccines- Shingrix (1 of 2) 09/01/2023 (Originally 05/14/1969)   FOOT EXAM  08/28/2023   INFLUENZA VACCINE  09/11/2023   OPHTHALMOLOGY EXAM  10/13/2023   HEMOGLOBIN A1C  12/02/2023   COLON CANCER SCREENING ANNUAL FOBT  03/03/2024   Diabetic kidney evaluation - eGFR measurement  06/01/2024   Medicare Annual Wellness (AWV)  08/04/2024   DTaP/Tdap/Td (2 - Td or Tdap) 01/06/2026   Colonoscopy  07/09/2026   Pneumococcal Vaccine: 50+ Years  Completed   Hepatitis C Screening  Completed   Hepatitis B Vaccines  Aged Out   HPV VACCINES  Aged Out   Meningococcal B Vaccine  Aged Out    Health Maintenance  Health Maintenance Due  Topic Date Due   Diabetic kidney evaluation - Urine ACR  08/28/2023    Colorectal cancer screening: No longer required.   Lung Cancer Screening: (Low Dose CT Chest recommended if Age 65-80 years, 20 pack-year currently smoking OR have quit w/in 15years.) does not qualify.   Lung Cancer Screening Referral:   Additional Screening:  Hepatitis C Screening: does not qualify; Completed  2017  Vision Screening: Recommended annual ophthalmology exams for early detection of glaucoma and other disorders of the eye. Is the patient up to date with their annual eye exam?  Yes  Who is the provider or what is the name of the office in which the patient attends annual eye exams? Looking for a new  Ophthalmologist      education provided If pt is not established with a provider, would they like to be referred to a provider to establish care? No .   Dental Screening: Recommended annual dental exams for proper oral hygiene  Nutrition Risk Assessment:  Has the patient had any N/V/D within the last 2 months?  No  Does the patient have any non-healing wounds?  No  Has the patient had any unintentional weight loss or weight gain?  No   Diabetes:  Is the patient diabetic?  Yes  If diabetic, was a CBG obtained today?  No  Did the patient bring in their glucometer from home?  No  How often do you monitor your CBG's? 1 x a month.   Financial Strains and Diabetes Management:  Are you having any financial strains with the device, your supplies or your medication? No .  Does the patient want to be seen by Chronic Care Management for management of their diabetes?  No  Would the patient like to be referred to a Nutritionist or for Diabetic Management?  No   Diabetic Exams:  Diabetic Eye Exam:  Pt has been advised about the importance in completing this exam.  Diabetic Foot Exam: . Pt has been advised about the importance in completing this exam..    Community Resource Referral / Chronic Care Management: CRR required this visit?  No   CCM required this visit?  No     Plan:     I have personally reviewed and noted the following in the patient's chart:   Medical and social history Use of alcohol, tobacco or illicit drugs  Current medications and supplements including opioid prescriptions. Patient is not currently taking opioid prescriptions. Functional ability and  status Nutritional status Physical activity Advanced directives List of other physicians Hospitalizations, surgeries, and ER visits in previous 12 months Vitals  Screenings to include cognitive, depression, and falls Referrals and appointments  In addition, I have reviewed and discussed with patient certain preventive protocols, quality metrics, and best practice recommendations. A written personalized care plan for preventive services as well as general preventive health recommendations were provided to patient.     Mliss Graff, LPN   [id number]   After Visit Summary: (MyChart) Due to this being a telephonic visit, the after visit summary with patients personalized plan was offered to patient via MyChart   Nurse Notes:

## 2023-08-06 DIAGNOSIS — C44219 Basal cell carcinoma of skin of left ear and external auricular canal: Secondary | ICD-10-CM | POA: Diagnosis not present

## 2023-08-10 DIAGNOSIS — M5431 Sciatica, right side: Secondary | ICD-10-CM | POA: Diagnosis not present

## 2023-08-15 ENCOUNTER — Other Ambulatory Visit: Payer: Self-pay | Admitting: Family Medicine

## 2023-08-24 ENCOUNTER — Ambulatory Visit (INDEPENDENT_AMBULATORY_CARE_PROVIDER_SITE_OTHER): Admitting: Family Medicine

## 2023-08-24 ENCOUNTER — Encounter: Payer: Self-pay | Admitting: Family Medicine

## 2023-08-24 VITALS — BP 140/70 | HR 69 | Temp 96.0°F | Ht 73.0 in | Wt 250.2 lb

## 2023-08-24 DIAGNOSIS — I10 Essential (primary) hypertension: Secondary | ICD-10-CM

## 2023-08-24 DIAGNOSIS — M1A09X0 Idiopathic chronic gout, multiple sites, without tophus (tophi): Secondary | ICD-10-CM | POA: Diagnosis not present

## 2023-08-24 DIAGNOSIS — N1831 Chronic kidney disease, stage 3a: Secondary | ICD-10-CM | POA: Diagnosis not present

## 2023-08-24 MED ORDER — CHOLESTYRAMINE 4 G PO PACK: 4.0000 g | PACK | ORAL | 3 refills | Status: DC

## 2023-08-24 MED ORDER — FEBUXOSTAT 80 MG PO TABS: ORAL_TABLET | ORAL | 1 refills | Status: AC

## 2023-08-24 MED ORDER — LISINOPRIL 20 MG PO TABS
20.0000 mg | ORAL_TABLET | Freq: Every day | ORAL | 11 refills | Status: DC
Start: 2023-08-24 — End: 2023-10-12

## 2023-08-24 MED ORDER — CHOLESTYRAMINE 4 G PO PACK: 4.0000 g | PACK | ORAL | 3 refills | Status: AC

## 2023-08-24 NOTE — Progress Notes (Signed)
 OFFICE VISIT  08/24/2023  CC:  Chief Complaint  Patient presents with   Medical Management of Chronic Issues    Patient is a 73 y.o. male who presents for 1 month follow-up gout and hypertension.  INTERIM HX: At last visit we completely discontinued his hydrochlorothiazide .   We increased his Uloric  to 80 mg a day.  Has had 3 flares since I saw him last but says they are milder in intensity and only last a day or so. Still left foot. He does take colchicine  as needed and it helps.  Home syst 130-140/70-80.  ROS as above, plus--> no fevers, no CP, no SOB, no wheezing, no cough, no dizziness, no HAs, no rashes, no melena/hematochezia.  No polyuria or polydipsia.  No myalgias. No focal weakness, paresthesias, or tremors.  No acute vision or hearing abnormalities.  No dysuria or unusual/new urinary urgency or frequency.  No recent changes in lower legs. No n/v/d or abd pain.  No palpitations.    Past Medical History:  Diagnosis Date   Carpal tunnel syndrome on left    Cataract 07/2015   R eye--surgery recommended by his eye MD 08/03/15   Chronic fatigue    Chronic renal insufficiency, stage 3 (moderate) (HCC) 2021   Cr 1.2-1.4, GFR 50s-60s   Closed fracture of greater tuberosity of right humerus 02/28/2019   Immobil w/sling (Dr. Josefina)   Colon cancer screening 02/15/2018   iFOB neg 02/2018,  06/2020, 09/2021, and 02/2023   Community acquired pneumonia    06/2022 +sepsis--> hospitalization.  Needs follow-up CT with contrast chest in 3 months.   Erectile dysfunction    Dr. Chales.  [facility] 04/2016 [facility].   Gout of big toe 1990s   about 2 episodes per yr, pt favors no prev med   Herpes simplex ophthalmicus    L eye, has opthalmologist, uses prednisone  drops prn   History of gallstones    per pt report   History of kidney stones    History of prostatitis 1980s   Remote hx of seeing Dr. Andra at [facility]--then Dr. Chales.    Hyperlipidemia     Hypertension dx'd 08/2013   Microcytic anemia 06/2016   Nodular basal cell carcinoma (BCC) 02/19/2017   right temple-[id number]. Dr. Livingston   Shingles    recurrent L thorax   Sinus disease    Thalassemia    Mild anemia with MCV 60s (alpha thal minor or beta thal trait: Hb electrophoresis not done/no result available.).   Type 2 diabetes mellitus with microalbuminuria (HCC) Dx'd 08/2013    Past Surgical History:  Procedure Laterality Date   CHOLECYSTECTOMY N/A 01/26/2020   Procedure: LAPAROSCOPIC CHOLECYSTECTOMY;  Surgeon: Vernetta Berg, MD;  Location: [facility];  Service: General;  Laterality: N/A;   NASAL POLYP EXCISION  1960   NASAL SINUS SURGERY  03/20/2022   WISDOM TOOTH EXTRACTION      Outpatient Medications Prior to Visit  Medication Sig Dispense Refill   acetaminophen  (TYLENOL ) 500 MG tablet Take 500-1,000 mg by mouth every 6 (six) hours as needed (pain.).     amLODipine  (NORVASC ) 10 MG tablet Take 1 tablet (10 mg total) by mouth daily. 90 tablet 3   atorvastatin  (LIPITOR) 20 MG tablet Take 1 tablet (20 mg total) by mouth daily. 90 tablet 3   azelastine  (ASTELIN ) 0.1 % nasal spray Place 2 sprays into both nostrils 2 (two) times daily. Use in each nostril as directed     cetirizine (ZYRTEC) 10 MG  tablet Take 10 mg by mouth daily.     colchicine  0.6 MG tablet 2 tabs at onset of gout flare, then 1 tab one hour later, then start 1 tab bid until flare is resolved 30 tablet 6   fluticasone  (FLONASE ) 50 MCG/ACT nasal spray Place 2 sprays into both nostrils daily.     hydrocortisone  valerate ointment (WEST-CORT) 0.2 % Appy bid to affected area prn 15 g 2   metFORMIN  (GLUCOPHAGE ) 1000 MG tablet Take 1 tablet (1,000 mg total) by mouth 2 (two) times daily with a meal. 180 tablet 3   pioglitazone  (ACTOS ) 45 MG tablet Take 1 tablet (45 mg total) by mouth daily. 90 tablet 3   prednisoLONE acetate (PRED FORTE) 1 % ophthalmic suspension Place 1 drop into both eyes daily as needed (eye irritation).      sodium chloride  (OCEAN) 0.65 % SOLN nasal spray Place 1 spray into both nostrils as needed for congestion.     Febuxostat  (ULORIC ) 80 MG TABS 1 tab po qd 30 tablet 1   lisinopril  (ZESTRIL ) 10 MG tablet Take 1 tablet (10 mg total) by mouth daily. 90 tablet 0   No facility-administered medications prior to visit.    No Known Allergies  Review of Systems As per HPI  PE:    08/24/2023    9:18 AM 08/05/2023    8:55 AM 07/24/2023   11:04 AM  Vitals with BMI  Height  6' 1 6' 1.5  Weight 250 lbs 3 oz 252 lbs 252 lbs 3 oz  BMI 33.02 33.25 32.82  Systolic 151  133  Diastolic 70  73  Pulse 69  74     Physical Exam  Gen: Alert, well appearing.  Patient is oriented to person, place, time, and situation. AFFECT: pleasant, lucid thought and speech. No further exam today.  LABS:   Last metabolic panel Lab Results  Component Value Date   GLUCOSE 160 (H) 06/02/2023   NA 142 06/02/2023   K 4.1 06/02/2023   CL 108 06/02/2023   CO2 25 06/02/2023   BUN 20 06/02/2023   CREATININE 1.26 06/02/2023   EGFR 59 (L) 06/02/2023   CALCIUM  9.2 06/02/2023   PROT 6.5 06/02/2023   ALBUMIN 4.1 12/02/2022   BILITOT 1.0 06/02/2023   ALKPHOS 58 12/02/2022   AST 18 06/02/2023   ALT 18 06/02/2023   ANIONGAP 13 07/09/2022   Last lipids Lab Results  Component Value Date   CHOL 127 06/02/2023   HDL 39 (L) 06/02/2023   LDLCALC 64 06/02/2023   TRIG 166 (H) 06/02/2023   CHOLHDL 3.3 06/02/2023   Last hemoglobin A1c Lab Results  Component Value Date   HGBA1C 7.4 (H) 06/02/2023   Last thyroid functions Lab Results  Component Value Date   TSH 0.84 07/24/2017   Lab Results  Component Value Date   LABURIC 6.9 06/02/2023   IMPRESSION AND PLAN:  #1 chronic gouty arthritis, feet. We have not maximized on Uloric , 80 mg a day. His flares are less intense and shorter, although still has about 3/month. Will give current treatment more time.  He has colchicine  to use as needed flare. Will  repeat BMET and uric acid when I see him for next follow-up for his diabetes on 09/02/2023.  #2 uncontrolled hypertension. Increase lisinopril  to 20 mg a day today. Bmet at next follow-up on 09/02/2023.  #3 chronic renal insufficiency stage IIIa. GFR baseline in the upper 50s. Monitoring closely given our increase in Uloric , his periodic use  of colchicine , and today's increase in lisinopril .  An After Visit Summary was printed and given to the patient.  FOLLOW UP: Return for keep appt set for 09/02/23.  Signed:  Gerlene Hockey, MD           08/24/2023

## 2023-08-24 NOTE — Patient Instructions (Signed)
 Take 2 of the lisinopril  10mg  tabs daily until finished. Then start lisinopril  20mg  tab once a day

## 2023-09-02 ENCOUNTER — Ambulatory Visit: Admitting: Family Medicine

## 2023-09-02 ENCOUNTER — Encounter: Payer: Self-pay | Admitting: Family Medicine

## 2023-09-02 VITALS — BP 140/70 | HR 70 | Temp 98.1°F | Ht 73.0 in | Wt 246.6 lb

## 2023-09-02 DIAGNOSIS — Z7984 Long term (current) use of oral hypoglycemic drugs: Secondary | ICD-10-CM | POA: Diagnosis not present

## 2023-09-02 DIAGNOSIS — N1831 Chronic kidney disease, stage 3a: Secondary | ICD-10-CM

## 2023-09-02 DIAGNOSIS — I10 Essential (primary) hypertension: Secondary | ICD-10-CM

## 2023-09-02 DIAGNOSIS — M1A00X0 Idiopathic chronic gout, unspecified site, without tophus (tophi): Secondary | ICD-10-CM

## 2023-09-02 DIAGNOSIS — E1121 Type 2 diabetes mellitus with diabetic nephropathy: Secondary | ICD-10-CM | POA: Diagnosis not present

## 2023-09-02 LAB — URIC ACID: Uric Acid, Serum: 4.8 mg/dL (ref 4.0–7.8)

## 2023-09-02 NOTE — Progress Notes (Signed)
 OFFICE VISIT  09/02/2023  CC:  Chief Complaint  Patient presents with   Medical Management of Chronic Issues    Patient is a 73 y.o. male who presents for 66-month follow-up diabetes, hypertension, chronic renal insufficiency, and gout. A/P as of last visit: #1 DM with nephropathy. Control improving since off prednisone . Hba1c today is 7.4% (down from 7.8% last check).   #2 Hypertension, good control on amlodipine  10mg  every day and 25mg  hydrochlorothiazide  every day, and lisinopril  10mg  every day. Lytes/cr today.   #3 chronic renal insufficiency stage III. He avoids NSAIDs. Lytes normal and GFR stable at 39ml/min 3 wks ago.   INTERIM HX: We had to get him off of his HCTZ to decrease his tendency to get gout flares. We recently increased his lisinopril  to 20 mg a day.  We increased his Uloric  about 6 weeks ago.  He admits he does not drink as much clear fluids as he should. Home blood pressures are consistently around 130/80.  No home glucose monitoring.  No significant flare of gout since I saw him last. He has a brief feeling of slight numbness in both feet when he wakes up in the morning but when he gets out of bed and starts moving around it is completely gone.  ROS as above, plus--> no fevers, no CP, no SOB, no wheezing, no cough, no dizziness, no HAs, no rashes, no melena/hematochezia.  No polyuria or polydipsia.  No myalgias or arthralgias.  No focal weakness, paresthesias, or tremors.  No acute vision or hearing abnormalities.  No dysuria or unusual/new urinary urgency or frequency.  No recent changes in lower legs. No n/v/d or abd pain.  No palpitations.     Past Medical History:  Diagnosis Date   Carpal tunnel syndrome on left    Cataract 07/2015   R eye--surgery recommended by his eye MD 08/03/15   Chronic fatigue    Chronic renal insufficiency, stage 3 (moderate) (HCC) 2021   Cr 1.2-1.4, GFR 50s-60s   Closed fracture of greater tuberosity of right humerus  02/28/2019   Immobil w/sling (Dr. Josefina)   Colon cancer screening 02/15/2018   iFOB neg 02/2018,  06/2020, 09/2021, and 02/2023   Community acquired pneumonia    06/2022 +sepsis--> hospitalization.  Needs follow-up CT with contrast chest in 3 months.   Erectile dysfunction    Dr. Chales.  Testosterone  normal 04/2016 at [facility].   Gout of big toe 1990s   about 2 episodes per yr, pt favors no prev med   Herpes simplex ophthalmicus    L eye, has opthalmologist, uses prednisone  drops prn   History of gallstones    per pt report   History of kidney stones    History of prostatitis 1980s   Remote hx of seeing Dr. Andra at [facility]--then Dr. Chales.    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 Dr. Livingston   Shingles    recurrent L thorax   Sinus disease    Thalassemia    Mild anemia with MCV 60s (alpha thal minor or beta thal trait: Hb electrophoresis not done/no result available.).   Type 2 diabetes mellitus with microalbuminuria (HCC) Dx'd 08/2013    Past Surgical History:  Procedure Laterality Date   CHOLECYSTECTOMY N/A 01/26/2020   Procedure: LAPAROSCOPIC CHOLECYSTECTOMY;  Surgeon: Vernetta Berg, MD;  Location: [facility];  Service: General;  Laterality: N/A;   NASAL  POLYP EXCISION  1960   NASAL SINUS SURGERY  03/20/2022   WISDOM TOOTH EXTRACTION      Outpatient Medications Prior to Visit  Medication Sig Dispense Refill   acetaminophen  (TYLENOL ) 500 MG tablet Take 500-1,000 mg by mouth every 6 (six) hours as needed (pain.).     amLODipine  (NORVASC ) 10 MG tablet Take 1 tablet (10 mg total) by mouth daily. 90 tablet 3   atorvastatin  (LIPITOR) 20 MG tablet Take 1 tablet (20 mg total) by mouth daily. 90 tablet 3   azelastine  (ASTELIN ) 0.1 % nasal spray Place 2 sprays into both nostrils 2 (two) times daily. Use in each nostril as directed     cetirizine (ZYRTEC) 10 MG  tablet Take 10 mg by mouth daily.     cholestyramine  (QUESTRAN ) 4 g packet Take 1 packet (4 g total) by mouth once a week. 12 each 3   colchicine  0.6 MG tablet 2 tabs at onset of gout flare, then 1 tab one hour later, then start 1 tab bid until flare is resolved 30 tablet 6   Febuxostat  (ULORIC ) 80 MG TABS 1 tab po qd 90 tablet 1   fluticasone  (FLONASE ) 50 MCG/ACT nasal spray Place 2 sprays into both nostrils daily.     hydrocortisone  valerate ointment (WEST-CORT) 0.2 % Appy bid to affected area prn 15 g 2   lisinopril  (ZESTRIL ) 20 MG tablet Take 1 tablet (20 mg total) by mouth daily. 30 tablet 11   metFORMIN  (GLUCOPHAGE ) 1000 MG tablet Take 1 tablet (1,000 mg total) by mouth 2 (two) times daily with a meal. 180 tablet 3   pioglitazone  (ACTOS ) 45 MG tablet Take 1 tablet (45 mg total) by mouth daily. 90 tablet 3   prednisoLONE acetate (PRED FORTE) 1 % ophthalmic suspension Place 1 drop into both eyes daily as needed (eye irritation).     sodium chloride  (OCEAN) 0.65 % SOLN nasal spray Place 1 spray into both nostrils as needed for congestion.     No facility-administered medications prior to visit.    No Known Allergies  Review of Systems As per HPI  PE:    09/02/2023    8:18 AM 09/02/2023    8:02 AM 08/24/2023   10:06 AM  Vitals with BMI  Height  6' 1   Weight  246 lbs 10 oz   BMI  32.54   Systolic 140 163 859  Diastolic 70 69 70  Pulse  70      Physical Exam  Gen: Alert, well appearing.  Patient is oriented to person, place, time, and situation. AFFECT: pleasant, lucid thought and speech. CV: RRR, no m/r/g.   LUNGS: CTA bilat, nonlabored resps, good aeration in all lung fields. EXT: no clubbing or cyanosis.  no edema.  Foot exam - no swelling, tenderness or skin or vascular lesions. Color and temperature is normal. Sensation is intact. Peripheral pulses are palpable. Toenails are normal.   LABS:  Last CBC Lab Results  Component Value Date   WBC 5.4 12/02/2022   HGB  10.9 (L) 12/02/2022   HCT 35.6 (L) 12/02/2022   MCV 62.6 Wrong tube collected (L) 12/02/2022   MCH 19.1 (L) 09/01/2022   RDW 16.7 (H) 12/02/2022   PLT 223.0 12/02/2022   Last metabolic panel Lab Results  Component Value Date   GLUCOSE 160 (H) 06/02/2023   NA 142 06/02/2023   K 4.1 06/02/2023   CL 108 06/02/2023   CO2 25 06/02/2023   BUN 20 06/02/2023  CREATININE 1.26 06/02/2023   EGFR 59 (L) 06/02/2023   CALCIUM  9.2 06/02/2023   PROT 6.5 06/02/2023   ALBUMIN 4.1 12/02/2022   BILITOT 1.0 06/02/2023   ALKPHOS 58 12/02/2022   AST 18 06/02/2023   ALT 18 06/02/2023   ANIONGAP 13 07/09/2022   Last lipids Lab Results  Component Value Date   CHOL 127 06/02/2023   HDL 39 (L) 06/02/2023   LDLCALC 64 06/02/2023   TRIG 166 (H) 06/02/2023   CHOLHDL 3.3 06/02/2023   Last hemoglobin A1c Lab Results  Component Value Date   HGBA1C 7.4 (H) 06/02/2023   Last thyroid functions Lab Results  Component Value Date   TSH 0.84 07/24/2017   Lab Results  Component Value Date   LABURIC 6.9 06/02/2023   IMPRESSION AND PLAN:  DM with nephropathy. Check hemoglobin A1c today and kidney function today. Urine microalbumin/creatinine today. Feet exam normal today. Continue Actos  45 mg a day and metformin  1000 mg twice a day.  #2 Hypertension, good control on amlodipine  10mg  every day and lisinopril  20mg  every day. Lytes/cr today.   #3 chronic renal insufficiency stage III. He avoids NSAIDs.  Needs to improve clear fluid intake. Basic metabolic panel today.  #4 gout.  Improving since getting on Uloric . Continue Uloric  80 mg a day.  He uses colchicine  on a as needed basis. Check uric acid level today.  An After Visit Summary was printed and given to the patient.  FOLLOW UP: Return in about 3 months (around 12/03/2023) for annual CPE (fasting). Next CPE 11/2023 Signed:  Gerlene Hockey, MD           09/02/2023

## 2023-09-03 ENCOUNTER — Ambulatory Visit: Payer: Self-pay | Admitting: Family Medicine

## 2023-09-03 ENCOUNTER — Encounter: Payer: Self-pay | Admitting: Family Medicine

## 2023-09-03 DIAGNOSIS — J349 Unspecified disorder of nose and nasal sinuses: Secondary | ICD-10-CM | POA: Insufficient documentation

## 2023-09-03 DIAGNOSIS — R809 Proteinuria, unspecified: Secondary | ICD-10-CM

## 2023-09-03 LAB — BASIC METABOLIC PANEL WITH GFR
BUN: 14 mg/dL (ref 7–25)
CO2: 26 mmol/L (ref 20–32)
Calcium: 9.2 mg/dL (ref 8.6–10.3)
Chloride: 106 mmol/L (ref 98–110)
Creat: 1.01 mg/dL (ref 0.70–1.28)
Glucose, Bld: 131 mg/dL — ABNORMAL HIGH (ref 65–99)
Potassium: 4 mmol/L (ref 3.5–5.3)
Sodium: 141 mmol/L (ref 135–146)
eGFR: 78 mL/min/1.73m2 (ref >=60)

## 2023-09-03 LAB — HEMOGLOBIN A1C
Hgb A1c MFr Bld: 5.9 % — ABNORMAL HIGH (ref <5.7)
Mean Plasma Glucose: 123 mg/dL
eAG (mmol/L): 6.8 mmol/L

## 2023-09-03 LAB — MICROALBUMIN / CREATININE URINE RATIO
Creatinine, Urine: 40 mg/dL (ref 20–320)
Microalb Creat Ratio: 273 mg/g{creat} — ABNORMAL HIGH (ref <30)
Microalb, Ur: 10.9 mg/dL

## 2023-09-07 DIAGNOSIS — M5431 Sciatica, right side: Secondary | ICD-10-CM | POA: Diagnosis not present

## 2023-10-05 ENCOUNTER — Other Ambulatory Visit (INDEPENDENT_AMBULATORY_CARE_PROVIDER_SITE_OTHER)

## 2023-10-05 DIAGNOSIS — R809 Proteinuria, unspecified: Secondary | ICD-10-CM

## 2023-10-05 LAB — MICROALBUMIN / CREATININE URINE RATIO
Creatinine,U: 54 mg/dL
Microalb Creat Ratio: 101.2 mg/g — ABNORMAL HIGH (ref 0.0–30.0)
Microalb, Ur: 5.5 mg/dL — ABNORMAL HIGH (ref 0.0–1.9)

## 2023-10-12 ENCOUNTER — Ambulatory Visit: Payer: Self-pay | Admitting: Family Medicine

## 2023-10-12 DIAGNOSIS — E1121 Type 2 diabetes mellitus with diabetic nephropathy: Secondary | ICD-10-CM

## 2023-10-12 MED ORDER — LISINOPRIL 30 MG PO TABS: 30.0000 mg | ORAL_TABLET | Freq: Every day | ORAL | 1 refills | Status: DC

## 2023-11-05 ENCOUNTER — Other Ambulatory Visit: Payer: Self-pay | Admitting: Family Medicine

## 2023-11-20 NOTE — Progress Notes (Signed)
 Zachary Chandler                                          MRN: [id number]   11/20/2023   The [facility] Quality Team Specialist reviewed this patient medical record for the purposes of chart review for care gap closure. The following were reviewed: chart review for care gap closure-controlling blood pressure.    [facility] Quality Team

## 2023-12-08 ENCOUNTER — Ambulatory Visit (INDEPENDENT_AMBULATORY_CARE_PROVIDER_SITE_OTHER): Admitting: Family Medicine

## 2023-12-08 ENCOUNTER — Encounter: Payer: Self-pay | Admitting: Family Medicine

## 2023-12-08 VITALS — BP 149/71 | HR 82 | Temp 97.3°F | Ht 73.0 in | Wt 253.0 lb

## 2023-12-08 DIAGNOSIS — E7800 Pure hypercholesterolemia, unspecified: Secondary | ICD-10-CM | POA: Diagnosis not present

## 2023-12-08 DIAGNOSIS — Z0000 Encounter for general adult medical examination without abnormal findings: Secondary | ICD-10-CM | POA: Diagnosis not present

## 2023-12-08 DIAGNOSIS — N2889 Other specified disorders of kidney and ureter: Secondary | ICD-10-CM

## 2023-12-08 DIAGNOSIS — Z125 Encounter for screening for malignant neoplasm of prostate: Secondary | ICD-10-CM

## 2023-12-08 DIAGNOSIS — Z7984 Long term (current) use of oral hypoglycemic drugs: Secondary | ICD-10-CM | POA: Diagnosis not present

## 2023-12-08 DIAGNOSIS — Z1211 Encounter for screening for malignant neoplasm of colon: Secondary | ICD-10-CM | POA: Diagnosis not present

## 2023-12-08 DIAGNOSIS — E119 Type 2 diabetes mellitus without complications: Secondary | ICD-10-CM

## 2023-12-08 DIAGNOSIS — E1121 Type 2 diabetes mellitus with diabetic nephropathy: Secondary | ICD-10-CM | POA: Diagnosis not present

## 2023-12-08 DIAGNOSIS — I10 Essential (primary) hypertension: Secondary | ICD-10-CM

## 2023-12-08 LAB — POCT GLYCOSYLATED HEMOGLOBIN (HGB A1C)
HbA1c POC (<> result, manual entry): 6.1 % (ref 4.0–5.6)
HbA1c, POC (controlled diabetic range): 6.1 % (ref 0.0–7.0)
HbA1c, POC (prediabetic range): 6.1 % (ref 5.7–6.4)
Hemoglobin A1C: 6.1 % — AB (ref 4.0–5.6)

## 2023-12-08 LAB — PSA, MEDICARE: PSA: 0.75 ng/mL (ref 0.10–4.00)

## 2023-12-08 MED ORDER — AZELASTINE HCL 0.1 % NA SOLN: 2.0000 | Freq: Two times a day (BID) | NASAL | 6 refills | Status: DC

## 2023-12-08 MED ORDER — LISINOPRIL 40 MG PO TABS: 40.0000 mg | ORAL_TABLET | Freq: Every day | ORAL | 3 refills | Status: DC

## 2023-12-08 NOTE — Progress Notes (Signed)
 Office Note 12/08/2023  CC:  Chief Complaint  Patient presents with   Annual Exam    Pt is fasting   Patient is a 73 y.o. male who is here for annual health maintenance exam and 74-month follow-up diabetes, hypertension, and chronic renal insufficiency. A/P as of last visit: DM with nephropathy. Check hemoglobin A1c today and kidney function today. Urine microalbumin/creatinine today. Feet exam normal today. Continue Actos  45 mg a day and metformin  1000 mg twice a day.   #2 Hypertension, good control on amlodipine  10mg  every day and lisinopril  20mg  every day. Lytes/cr today.   #3 chronic renal insufficiency stage III. He avoids NSAIDs.  Needs to improve clear fluid intake. Basic metabolic panel today.   #4 gout.  Improving since getting on Uloric . Continue Uloric  80 mg a day.  He uses colchicine  on a as needed basis. Check uric acid level today.  INTERIM HX: Urine microalbumin/creatinine had improved last visit but we still increased his lisinopril  to 30 mg a day.  Dempsey feel well. His gout flares are occurring much less often and are milder when he does get them.  He is compliant with Uloric  80 mg and he uses colchicine  on a as needed basis.  Home blood pressures consistently 135-150 systolic, typically around 70-80 diastolic.   Past Medical History:  Diagnosis Date   Carpal tunnel syndrome on left    Cataract 07/2015   R eye--surgery recommended by his eye MD 08/03/15   Chronic fatigue    Chronic renal insufficiency, stage 3 (moderate) 2021   Cr 1.2-1.4, GFR 50s-60s   Closed fracture of greater tuberosity of right humerus 02/28/2019   Immobil w/sling (Dr. Josefina)   Colon cancer screening 02/15/2018   iFOB neg 02/2018,  06/2020, 09/2021, and 02/2023   Erectile dysfunction    Dr. Chales.  Testosterone  [facility] 04/2016 at [facility].   Gout of big toe 1990s   about 2 episodes per yr, pt favors no prev med   Herpes simplex ophthalmicus    L eye, has opthalmologist,  uses prednisone  drops prn   History of kidney stones    History of pneumonia    06/2022 +sepsis--> hospitalization.   History of prostatitis 1980s   Remote hx of seeing Dr. Andra at [facility]--then Dr. Chales.    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 Dr. Livingston   Shingles    recurrent L thorax   Thalassemia    Mild anemia with MCV 60s (alpha thal minor or beta thal trait: Hb electrophoresis not done/no result available.).   Type 2 diabetes mellitus with microalbuminuria (HCC) Dx'd 08/2013    Past Surgical History:  Procedure Laterality Date   CHOLECYSTECTOMY N/A 01/26/2020   Procedure: LAPAROSCOPIC CHOLECYSTECTOMY;  Surgeon: Vernetta Berg, MD;  Location: [facility];  Service: General;  Laterality: N/A;   NASAL POLYP EXCISION  1960   NASAL SINUS SURGERY  03/20/2022   WISDOM TOOTH EXTRACTION      Family History  Problem Relation Age of Onset   Heart disease Mother    Hypertension Mother    Diabetes Mother    Heart disease Father    Heart attack Sister     Social History   Socioeconomic History   Marital status: Married    Spouse name: Not on file   Number of children: Not on file   Years of education: Not on file  Highest education level: Some college, no degree  Occupational History   Not on file  Tobacco Use   Smoking status: Former    Current packs/day: 0.00    Types: Cigarettes    Quit date: 02/28/1970    Years since quitting: 53.8   Smokeless tobacco: Never  Vaping Use   Vaping status: Never Used  Substance and Sexual Activity   Alcohol use: No    Alcohol/week: 0.0 standard drinks of alcohol   Drug use: No   Sexual activity: Not Currently  Other Topics Concern   Not on file  Social History Narrative   Married, has one 72 y/o son.   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     Orig from [place].   Was in [employer]  [phone].  No combat duty.  No VA care.   Smoked 10-12 yrs, quit in his early 28s.   Alcohol: none   No hx of drugs.            Social Drivers of Corporate Investment Banker Strain: Low Risk  (08/05/2023)   Overall Financial Resource Strain (CARDIA)    Difficulty of Paying Living Expenses: Not hard at all  Food Insecurity: No Food Insecurity (08/05/2023)   Hunger Vital Sign    Worried About Running Out of Food in the Last Year: Never true    Ran Out of Food in the Last Year: Never true  Transportation Needs: No Transportation Needs (08/05/2023)   PRAPARE - Administrator, Civil Service (Medical): No    Lack of Transportation (Non-Medical): No  Physical Activity: Inactive (08/05/2023)   Exercise Vital Sign    Days of Exercise per Week: 0 days    Minutes of Exercise per Session: 0 min  Stress: No Stress Concern Present (08/05/2023)   [employer] of Occupational Health - Occupational Stress Questionnaire    Feeling of Stress: Not at all  Social Connections: Socially Integrated (08/05/2023)   Social Connection and Isolation Panel    Frequency of Communication with Friends and Family: Never    Frequency of Social Gatherings with Friends and Family: More than three times a week    Attends Religious Services: More than 4 times per year    Active Member of [employer] or Organizations: Yes    Attends Engineer, Structural: More than 4 times per year    Marital Status: Married  Catering Manager Violence: Not At Risk (08/05/2023)   Humiliation, Afraid, Rape, and Kick questionnaire    Fear of Current or Ex-Partner: No    Emotionally Abused: No    Physically Abused: No    Sexually Abused: No    Outpatient Medications Prior to Visit  Medication Sig Dispense Refill   acetaminophen  (TYLENOL ) 500 MG tablet Take 500-1,000 mg by mouth every 6 (six) hours as needed (pain.).     amLODipine  (NORVASC ) 10 MG tablet Take 1 tablet (10 mg total) by mouth daily. 90 tablet 3    atorvastatin  (LIPITOR) 20 MG tablet Take 1 tablet (20 mg total) by mouth daily. 90 tablet 3   cetirizine (ZYRTEC) 10 MG tablet Take 10 mg by mouth daily.     cholestyramine  (QUESTRAN ) 4 g packet Take 1 packet (4 g total) by mouth once a week. 12 each 3   colchicine  0.6 MG tablet 2 tabs at onset of gout flare, then 1 tab one hour later, then start 1 tab bid until flare is resolved 30 tablet 6  Febuxostat  (ULORIC ) 80 MG TABS 1 tab po qd 90 tablet 1   fluticasone  (FLONASE ) 50 MCG/ACT nasal spray Place 2 sprays into both nostrils daily.     hydrocortisone  valerate ointment (WEST-CORT) 0.2 % Appy bid to affected area prn 15 g 2   metFORMIN  (GLUCOPHAGE ) 1000 MG tablet Take 1 tablet (1,000 mg total) by mouth 2 (two) times daily with a meal. 180 tablet 3   pioglitazone  (ACTOS ) 45 MG tablet Take 1 tablet (45 mg total) by mouth daily. 90 tablet 3   prednisoLONE acetate (PRED FORTE) 1 % ophthalmic suspension Place 1 drop into both eyes daily as needed (eye irritation).     sodium chloride  (OCEAN) 0.65 % SOLN nasal spray Place 1 spray into both nostrils as needed for congestion.     azelastine  (ASTELIN ) 0.1 % nasal spray Place 2 sprays into both nostrils 2 (two) times daily. Use in each nostril as directed     lisinopril  (ZESTRIL ) 30 MG tablet Take 1 tablet (30 mg total) by mouth daily. 30 tablet 1   No facility-administered medications prior to visit.    No Known Allergies  Review of Systems  Constitutional:  Negative for appetite change, chills, fatigue and fever.  HENT:  Negative for congestion, dental problem, ear pain and sore throat.   Eyes:  Negative for discharge, redness and visual disturbance.  Respiratory:  Negative for cough, chest tightness, shortness of breath and wheezing.   Cardiovascular:  Negative for chest pain, palpitations and leg swelling.  Gastrointestinal:  Negative for abdominal pain, blood in stool, diarrhea, nausea and vomiting.  Genitourinary:  Negative for difficulty  urinating, dysuria, flank pain, frequency, hematuria and urgency.  Musculoskeletal:  Negative for arthralgias, back pain, joint swelling, myalgias and neck stiffness.  Skin:  Negative for pallor and rash.  Neurological:  Negative for dizziness, speech difficulty, weakness and headaches.  Hematological:  Negative for adenopathy. Does not bruise/bleed easily.  Psychiatric/Behavioral:  Negative for confusion and sleep disturbance. The patient is not nervous/anxious.    PE;    12/08/2023    8:48 AM 12/08/2023    8:38 AM 09/02/2023    8:18 AM  Vitals with BMI  Height  6' 1   Weight  253 lbs   BMI  33.39   Systolic 149 154 859  Diastolic 71 81 70  Pulse  82     Gen: Alert, well appearing.  Patient is oriented to person, place, time, and situation. AFFECT: pleasant, lucid thought and speech. ENT: Ears: EACs clear, normal epithelium.  TMs with good light reflex and landmarks bilaterally.  Eyes: no injection, icteris, swelling, or exudate.  EOMI, PERRLA. Nose: no drainage or turbinate edema/swelling.  No injection or focal lesion.  Mouth: lips without lesion/swelling.  Oral mucosa pink and moist.  Dentition intact and without obvious caries or gingival swelling.  Oropharynx without erythema, exudate, or swelling.  Neck: supple/nontender.  No LAD, mass, or TM.  Carotid pulses 2+ bilaterally, without bruits. CV: RRR, no m/r/g.   LUNGS: CTA bilat, nonlabored resps, good aeration in all lung fields. ABD: soft, NT, ND, BS normal.  No hepatospenomegaly or mass.  No bruits. EXT: no clubbing or cyanosis.  He has 2+ bilateral lower extremity pitting edema, slightly worse on the left leg.  Musculoskeletal: no joint swelling, erythema, warmth, or tenderness.  ROM of all joints intact. Skin - no sores or suspicious lesions or rashes or color changes  Pertinent labs:  Lab Results  Component Value Date  TSH 0.84 07/24/2017   Lab Results  Component Value Date   WBC 5.4 12/02/2022   HGB 10.9 (L)  12/02/2022   HCT 35.6 (L) 12/02/2022   MCV 62.6 Wrong tube collected (L) 12/02/2022   PLT 223.0 12/02/2022   Lab Results  Component Value Date   CREATININE 1.01 09/02/2023   BUN 14 09/02/2023   NA 141 09/02/2023   K 4.0 09/02/2023   CL 106 09/02/2023   CO2 26 09/02/2023   Lab Results  Component Value Date   ALT 18 06/02/2023   AST 18 06/02/2023   ALKPHOS 58 12/02/2022   BILITOT 1.0 06/02/2023   Lab Results  Component Value Date   CHOL 127 06/02/2023   Lab Results  Component Value Date   HDL 39 (L) 06/02/2023   Lab Results  Component Value Date   LDLCALC 64 06/02/2023   Lab Results  Component Value Date   TRIG 166 (H) 06/02/2023   Lab Results  Component Value Date   CHOLHDL 3.3 06/02/2023   Lab Results  Component Value Date   PSA 0.92 12/02/2022   PSA 0.69 06/26/2021   PSA 0.93 05/31/2019   Lab Results  Component Value Date   HGBA1C 6.1 (A) 12/08/2023   HGBA1C 6.1 12/08/2023   HGBA1C 6.1 12/08/2023   HGBA1C 6.1 12/08/2023   ASSESSMENT AND PLAN:   #1 health maintenance exam: Reviewed age and gender appropriate health maintenance issues (prudent diet, regular exercise, health risks of tobacco and excessive alcohol, use of seatbelts, fire alarms in home, use of sunscreen).  Also reviewed age and gender appropriate health screening as well as vaccine recommendations. Vaccines: Shingrix-pt declined.  Flu declined.  O/W UTD. Labs: cbc,bmet,Hba1c,urine microalb/cr Prostate ca screening: PSA today Colon ca screening: iFOB neg x 3 in the last few years-->iFOB ordered.  #2 diabetes with microalbuminuria. Control -->POC Hba1c is 6.1% today. Continue metformin  1000 mg twice daily and pioglitazone  45 mg daily. Urine microalbumin/creatinine today.  3.  Hypertension, not well-controlled. Increase lisinopril  to 40 mg a day.  Continue amlodipine  10 mg a day. Monitor electrolytes and creatinine today.  4. chronic renal insufficiency stage III. He avoids NSAIDs.   Needs to improve clear fluid intake. Basic metabolic panel today.  #5 hypercholesterolemia, doing well long-term on atorvastatin  20 mg daily. Check lipid panel and hepatic panel today.  An After Visit Summary was printed and given to the patient.  FOLLOW UP:  Return in about 4 weeks (around 01/05/2024) for Follow-up hypertension.  Signed:  Gerlene Hockey, MD           12/08/2023

## 2023-12-08 NOTE — Patient Instructions (Signed)
 Health Maintenance, Male  Adopting a healthy lifestyle and getting preventive care are important in promoting health and wellness. Ask your health care provider about:  The right schedule for you to have regular tests and exams.  Things you can do on your own to prevent diseases and keep yourself healthy.  What should I know about diet, weight, and exercise?  Eat a healthy diet    Eat a diet that includes plenty of vegetables, fruits, low-fat dairy products, and lean protein.  Do not eat a lot of foods that are high in solid fats, added sugars, or sodium.  Maintain a healthy weight  Body mass index (BMI) is a measurement that can be used to identify possible weight problems. It estimates body fat based on height and weight. Your health care provider can help determine your BMI and help you achieve or maintain a healthy weight.  Get regular exercise  Get regular exercise. This is one of the most important things you can do for your health. Most adults should:  Exercise for at least 150 minutes each week. The exercise should increase your heart rate and make you sweat (moderate-intensity exercise).  Do strengthening exercises at least twice a week. This is in addition to the moderate-intensity exercise.  Spend less time sitting. Even light physical activity can be beneficial.  Watch cholesterol and blood lipids  Have your blood tested for lipids and cholesterol at 75 years of age, then have this test every 5 years.  You may need to have your cholesterol levels checked more often if:  Your lipid or cholesterol levels are high.  You are older than 75 years of age.  You are at high risk for heart disease.  What should I know about cancer screening?  Many types of cancers can be detected early and may often be prevented. Depending on your health history and family history, you may need to have cancer screening at various ages. This may include screening for:  Colorectal cancer.  Prostate cancer.  Skin cancer.  Lung  cancer.  What should I know about heart disease, diabetes, and high blood pressure?  Blood pressure and heart disease  High blood pressure causes heart disease and increases the risk of stroke. This is more likely to develop in people who have high blood pressure readings or are overweight.  Talk with your health care provider about your target blood pressure readings.  Have your blood pressure checked:  Every 3-5 years if you are 24-52 years of age.  Every year if you are 3 years old or older.  If you are between the ages of 60 and 72 and are a current or former smoker, ask your health care provider if you should have a one-time screening for abdominal aortic aneurysm (AAA).  Diabetes  Have regular diabetes screenings. This checks your fasting blood sugar level. Have the screening done:  Once every three years after age 66 if you are at a normal weight and have a low risk for diabetes.  More often and at a younger age if you are overweight or have a high risk for diabetes.  What should I know about preventing infection?  Hepatitis B  If you have a higher risk for hepatitis B, you should be screened for this virus. Talk with your health care provider to find out if you are at risk for hepatitis B infection.  Hepatitis C  Blood testing is recommended for:  Everyone born from [birth year] through 1967.  Anyone  with known risk factors for hepatitis C.  Sexually transmitted infections (STIs)  You should be screened each year for STIs, including gonorrhea and chlamydia, if:  You are sexually active and are younger than 75 years of age.  You are older than 75 years of age and your health care provider tells you that you are at risk for this type of infection.  Your sexual activity has changed since you were last screened, and you are at increased risk for chlamydia or gonorrhea. Ask your health care provider if you are at risk.  Ask your health care provider about whether you are at high risk for HIV. Your health care provider  may recommend a prescription medicine to help prevent HIV infection. If you choose to take medicine to prevent HIV, you should first get tested for HIV. You should then be tested every 3 months for as long as you are taking the medicine.  Follow these instructions at home:  Alcohol use  Do not drink alcohol if your health care provider tells you not to drink.  If you drink alcohol:  Limit how much you have to 0-2 drinks a day.  Know how much alcohol is in your drink. In the [place] one drink equals one 12 oz bottle of beer (355 mL), one 5 oz glass of wine (148 mL), or one 1 oz glass of hard liquor (44 mL).  Lifestyle  Do not use any products that contain nicotine or tobacco. These products include cigarettes, chewing tobacco, and vaping devices, such as e-cigarettes. If you need help quitting, ask your health care provider.  Do not use street drugs.  Do not share needles.  Ask your health care provider for help if you need support or information about quitting drugs.  General instructions  Schedule regular health, dental, and eye exams.  Stay current with your vaccines.  Tell your health care provider if:  You often feel depressed.  You have ever been abused or do not feel safe at home.  Summary  Adopting a healthy lifestyle and getting preventive care are important in promoting health and wellness.  Follow your health care provider's instructions about healthy diet, exercising, and getting tested or screened for diseases.  Follow your health care provider's instructions on monitoring your cholesterol and blood pressure.  This information is not intended to replace advice given to you by your health care provider. Make sure you discuss any questions you have with your health care provider.  Document Revised: 06/18/2020 Document Reviewed: 06/18/2020  Elsevier Patient Education  2024 [employer].

## 2023-12-09 ENCOUNTER — Ambulatory Visit: Payer: Self-pay | Admitting: Family Medicine

## 2023-12-09 LAB — LIPID PANEL
Cholesterol: 107 mg/dL (ref <200)
HDL: 46 mg/dL (ref >=40)
LDL Cholesterol (Calc): 44 mg/dL
Non-HDL Cholesterol (Calc): 61 mg/dL (ref <130)
Total CHOL/HDL Ratio: 2.3 (calc) (ref <5.0)
Triglycerides: 90 mg/dL (ref <150)

## 2023-12-09 LAB — COMPREHENSIVE METABOLIC PANEL WITH GFR
AG Ratio: 1.8 (calc) (ref 1.0–2.5)
ALT: 13 U/L (ref 9–46)
AST: 15 U/L (ref 10–35)
Albumin: 4.2 g/dL (ref 3.6–5.1)
Alkaline phosphatase (APISO): 64 U/L (ref 35–144)
BUN: 17 mg/dL (ref 7–25)
CO2: 22 mmol/L (ref 20–32)
Calcium: 8.9 mg/dL (ref 8.6–10.3)
Chloride: 107 mmol/L (ref 98–110)
Creat: 1.08 mg/dL (ref 0.70–1.28)
Globulin: 2.3 g/dL (ref 1.9–3.7)
Glucose, Bld: 127 mg/dL — ABNORMAL HIGH (ref 65–99)
Potassium: 4.1 mmol/L (ref 3.5–5.3)
Sodium: 140 mmol/L (ref 135–146)
Total Bilirubin: 1.5 mg/dL — ABNORMAL HIGH (ref 0.2–1.2)
Total Protein: 6.5 g/dL (ref 6.1–8.1)
eGFR: 72 mL/min/1.73m2 (ref >=60)

## 2023-12-09 LAB — CBC WITH DIFFERENTIAL/PLATELET
Absolute Lymphocytes: 961 {cells}/uL (ref 850–3900)
Absolute Monocytes: 556 {cells}/uL (ref 200–950)
Basophils Absolute: 70 {cells}/uL (ref 0–200)
Basophils Relative: 1.3 %
Eosinophils Absolute: 97 {cells}/uL (ref 15–500)
Eosinophils Relative: 1.8 %
HCT: 34 % — ABNORMAL LOW (ref 38.5–50.0)
Hemoglobin: 10 g/dL — ABNORMAL LOW (ref 13.2–17.1)
MCH: 19.2 pg — ABNORMAL LOW (ref 27.0–33.0)
MCHC: 29.4 g/dL — ABNORMAL LOW (ref 32.0–36.0)
MCV: 65.4 fL — ABNORMAL LOW (ref 80.0–100.0)
Monocytes Relative: 10.3 %
Neutro Abs: 3715 {cells}/uL (ref 1500–7800)
Neutrophils Relative %: 68.8 %
Platelets: 254 Thousand/uL (ref 140–400)
RBC: 5.2 Million/uL (ref 4.20–5.80)
RDW: 17.9 % — ABNORMAL HIGH (ref 11.0–15.0)
Total Lymphocyte: 17.8 %
WBC: 5.4 Thousand/uL (ref 3.8–10.8)

## 2023-12-09 LAB — CBC MORPHOLOGY

## 2023-12-12 ENCOUNTER — Other Ambulatory Visit: Payer: Self-pay | Admitting: Family Medicine

## 2024-01-15 ENCOUNTER — Ambulatory Visit (INDEPENDENT_AMBULATORY_CARE_PROVIDER_SITE_OTHER): Admitting: Family Medicine

## 2024-01-15 ENCOUNTER — Encounter: Payer: Self-pay | Admitting: Family Medicine

## 2024-01-15 VITALS — BP 144/60 | HR 76 | Temp 97.2°F | Ht 73.0 in | Wt 253.2 lb

## 2024-01-15 DIAGNOSIS — I10 Essential (primary) hypertension: Secondary | ICD-10-CM

## 2024-01-15 DIAGNOSIS — R5383 Other fatigue: Secondary | ICD-10-CM | POA: Diagnosis not present

## 2024-01-15 DIAGNOSIS — R6889 Other general symptoms and signs: Secondary | ICD-10-CM | POA: Diagnosis not present

## 2024-01-15 MED ORDER — VALSARTAN 160 MG PO TABS: 160.0000 mg | ORAL_TABLET | Freq: Every day | ORAL | 0 refills | Status: DC

## 2024-01-15 NOTE — Patient Instructions (Addendum)
 Stop taking lisinopril .  Continue taking amlodipine  10 mg a day.   Start taking valsartan  160 mg tab once a day.  If your blood pressure is not less than 140 on top and less than 90 on bottom in 3 days then increase your valsartan  to 1 tab twice per day.

## 2024-01-15 NOTE — Progress Notes (Signed)
 OFFICE VISIT  01/15/2024  CC:  Chief Complaint  Patient presents with   Medical Management of Chronic Issues    Patient is a 73 y.o. male who presents for 5-week follow-up hypertension. A/P as of last visit: #1 diabetes with microalbuminuria. Good Control -->POC Hba1c is 6.1% today. Continue metformin  1000 mg twice daily and pioglitazone  45 mg daily. Urine microalbumin/creatinine today.   2.  Hypertension, not well-controlled. Increase lisinopril  to 40 mg a day.  Continue amlodipine  10 mg a day. Monitor electrolytes and creatinine today.   3. chronic renal insufficiency stage III. He avoids NSAIDs.  Needs to improve clear fluid intake. Basic metabolic panel today.   #4 hypercholesterolemia, doing well long-term on atorvastatin  20 mg daily. Check lipid panel and hepatic panel today.  INTERIM HX: Zachary Chandler feels tired since getting on the higher dose of lisinopril . His blood pressure is not any better, possibly even worse.  He says it is consistently in the 140s to 160s.  Lowest is 133. Diastolic is normal.  He feels cold intolerance. No headaches, vision abnormalities, dizziness, chest pain, shortness of breath, or lower extremity swelling.  No joint pain.  Past Medical History:  Diagnosis Date   Carpal tunnel syndrome on left    Cataract 07/2015   R eye--surgery recommended by his eye MD 08/03/15   Chronic fatigue    Chronic renal insufficiency, stage 3 (moderate) 2021   Cr 1.2-1.4, GFR 50s-60s   Closed fracture of greater tuberosity of right humerus 02/28/2019   Immobil w/sling (Dr. Josefina)   Colon cancer screening 02/15/2018   iFOB neg 02/2018,  06/2020, 09/2021, and 02/2023   Erectile dysfunction    Dr. Chales.  Testosterone  [facility] 04/2016 at [facility].   Gout of big toe 1990s   about 2 episodes per yr, pt favors no prev med   Herpes simplex ophthalmicus    L eye, has opthalmologist, uses prednisone  drops prn   History of kidney stones    History of pneumonia     06/2022 +sepsis--> hospitalization.   History of prostatitis 1980s   Remote hx of seeing Dr. Andra at [facility] urology--then Dr. Chales.    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 Dr. Livingston   Shingles    recurrent L thorax   Thalassemia    Mild anemia with MCV 60s (alpha thal minor or beta thal trait: Hb electrophoresis not done/no result available.).   Type 2 diabetes mellitus with microalbuminuria (HCC) Dx'd 08/2013    Past Surgical History:  Procedure Laterality Date   CHOLECYSTECTOMY N/A 01/26/2020   Procedure: LAPAROSCOPIC CHOLECYSTECTOMY;  Surgeon: Vernetta Berg, MD;  Location: [facility];  Service: General;  Laterality: N/A;   NASAL POLYP EXCISION  1960   NASAL SINUS SURGERY  03/20/2022   WISDOM TOOTH EXTRACTION      Outpatient Medications Prior to Visit  Medication Sig Dispense Refill   acetaminophen  (TYLENOL ) 500 MG tablet Take 500-1,000 mg by mouth every 6 (six) hours as needed (pain.).     amLODipine  (NORVASC ) 10 MG tablet Take 1 tablet (10 mg total) by mouth daily. 90 tablet 3   atorvastatin  (LIPITOR) 20 MG tablet Take 1 tablet (20 mg total) by mouth daily. 90 tablet 3   azelastine  (ASTELIN ) 0.1 % nasal spray Place 2 sprays into both nostrils 2 (two) times daily. Use in each nostril as directed 30 mL 6   cetirizine (ZYRTEC) 10  MG tablet Take 10 mg by mouth daily.     cholestyramine  (QUESTRAN ) 4 g packet Take 1 packet (4 g total) by mouth once a week. 12 each 3   colchicine  0.6 MG tablet 2 tabs at onset of gout flare, then 1 tab one hour later, then start 1 tab bid until flare is resolved 30 tablet 6   Febuxostat  (ULORIC ) 80 MG TABS 1 tab po qd 90 tablet 1   fluticasone  (FLONASE ) 50 MCG/ACT nasal spray Place 2 sprays into both nostrils daily.     hydrocortisone  valerate ointment (WEST-CORT) 0.2 % Appy bid to affected area prn 15 g 2   metFORMIN  (GLUCOPHAGE ) 1000  MG tablet Take 1 tablet (1,000 mg total) by mouth 2 (two) times daily with a meal. 180 tablet 3   pioglitazone  (ACTOS ) 45 MG tablet Take 1 tablet (45 mg total) by mouth daily. 90 tablet 3   prednisoLONE acetate (PRED FORTE) 1 % ophthalmic suspension Place 1 drop into both eyes daily as needed (eye irritation).     sodium chloride  (OCEAN) 0.65 % SOLN nasal spray Place 1 spray into both nostrils as needed for congestion.     lisinopril  (ZESTRIL ) 40 MG tablet Take 1 tablet (40 mg total) by mouth daily. 90 tablet 3   No facility-administered medications prior to visit.    No Known Allergies  Review of Systems As per HPI  PE:    01/15/2024    8:43 AM 01/15/2024    8:36 AM 12/08/2023    8:48 AM  Vitals with BMI  Height  6' 1   Weight  253 lbs 3 oz   BMI  33.41   Systolic 144 146 850  Diastolic 60 74 71  Pulse  76      Physical Exam  General: Alert and well-appearing. Affect is pleasant, thought and speech are lucid. No further exam today  LABS:  Last CBC Lab Results  Component Value Date   WBC 5.4 12/08/2023   HGB 10.0 (L) 12/08/2023   HCT 34.0 (L) 12/08/2023   MCV 65.4 (L) 12/08/2023   MCH 19.2 (L) 12/08/2023   RDW 17.9 (H) 12/08/2023   PLT 254 12/08/2023   Lab Results  Component Value Date   IRON 187 (H) 07/09/2016   FERRITIN 260.1 07/09/2016   Last metabolic panel Lab Results  Component Value Date   GLUCOSE 127 (H) 12/08/2023   NA 140 12/08/2023   K 4.1 12/08/2023   CL 107 12/08/2023   CO2 22 12/08/2023   BUN 17 12/08/2023   CREATININE 1.08 12/08/2023   EGFR 72 12/08/2023   CALCIUM  8.9 12/08/2023   PROT 6.5 12/08/2023   ALBUMIN 4.1 12/02/2022   BILITOT 1.5 (H) 12/08/2023   ALKPHOS 58 12/02/2022   AST 15 12/08/2023   ALT 13 12/08/2023   ANIONGAP 13 07/09/2022   Last lipids Lab Results  Component Value Date   CHOL 107 12/08/2023   HDL 46 12/08/2023   LDLCALC 44 12/08/2023   TRIG 90 12/08/2023   CHOLHDL 2.3 12/08/2023   Last hemoglobin  A1c Lab Results  Component Value Date   HGBA1C 6.1 (A) 12/08/2023   HGBA1C 6.1 12/08/2023   HGBA1C 6.1 12/08/2023   HGBA1C 6.1 12/08/2023   Last thyroid functions Lab Results  Component Value Date   TSH 0.84 07/24/2017   Lab Results  Component Value Date   PSA 0.75 12/08/2023   PSA 0.92 12/02/2022   PSA 0.69 06/26/2021   IMPRESSION AND PLAN:  #  1 hypertension, uncontrolled. He seemed to feel worse and blood pressure not improved with increase in lisinopril  to 40 mg daily. We will discontinue lisinopril  and start valsartan  160 mg tab daily.  Increase to one tab bid if not at <140 syst in 3d. Keep taking amlodipine  10mg  every day. BMET today.  2) Fatigue and cold intol. Check TSH today.  An After Visit Summary was printed and given to the patient.  FOLLOW UP: Return in about 2 weeks (around 01/29/2024) for Follow-up hypertension. Next CPE October 2026 Signed:  Gerlene Hockey, MD           01/15/2024

## 2024-01-16 LAB — BASIC METABOLIC PANEL WITH GFR
BUN: 16 mg/dL (ref 7–25)
CO2: 22 mmol/L (ref 20–32)
Calcium: 8.9 mg/dL (ref 8.6–10.3)
Chloride: 107 mmol/L (ref 98–110)
Creat: 1.06 mg/dL (ref 0.70–1.28)
Glucose, Bld: 141 mg/dL — ABNORMAL HIGH (ref 65–99)
Potassium: 4 mmol/L (ref 3.5–5.3)
Sodium: 141 mmol/L (ref 135–146)
eGFR: 73 mL/min/1.73m2 (ref >=60)

## 2024-01-16 LAB — TSH: TSH: 1.37 m[IU]/L (ref 0.40–4.50)

## 2024-01-17 ENCOUNTER — Ambulatory Visit: Payer: Self-pay | Admitting: Family Medicine

## 2024-01-22 NOTE — Progress Notes (Signed)
 Zachary Chandler                                          MRN: [id number]   01/22/2024   The [facility] Quality Team Specialist reviewed this patient medical record for the purposes of chart review for care gap closure. The following were reviewed: abstraction for care gap closure-glycemic status assessment.    [facility] Quality Team

## 2024-02-01 ENCOUNTER — Encounter: Payer: Self-pay | Admitting: Family Medicine

## 2024-02-01 ENCOUNTER — Ambulatory Visit: Admitting: Family Medicine

## 2024-02-01 VITALS — BP 156/70 | HR 99 | Temp 98.0°F | Ht 73.0 in | Wt 253.4 lb

## 2024-02-01 DIAGNOSIS — I10 Essential (primary) hypertension: Secondary | ICD-10-CM | POA: Diagnosis not present

## 2024-02-01 MED ORDER — VALSARTAN 320 MG PO TABS: 320.0000 mg | ORAL_TABLET | Freq: Every day | ORAL | 3 refills | Status: AC

## 2024-02-01 MED ORDER — METOPROLOL SUCCINATE ER 50 MG PO TB24: 50.0000 mg | ORAL_TABLET | Freq: Every day | ORAL | 0 refills | Status: DC

## 2024-02-01 NOTE — Progress Notes (Signed)
 OFFICE VISIT  02/01/2024  CC: No chief complaint on file.   Patient is a 73 y.o. male who presents for 2-week follow-up uncontrolled hypertension. A/P as of last visit: #1 hypertension, uncontrolled. He seemed to feel worse and blood pressure not improved with increase in lisinopril  to 40 mg daily. We will discontinue lisinopril  and start valsartan  160 mg tab daily.  Increase to one tab bid if not at <140 syst in 3d. Keep taking amlodipine  10mg  every day. BMET today.   2) Fatigue and cold intol. Check TSH today.  INTERIM HX: Home bp 140-180 syst, lowest 138. Sometimes 20 points diff in L and R arm measurements. He increased his valsartan  to TWO of the 160mg  tabs once a day. No med side effects.  No dizziness, CP, Has, diaphoresis, or palpitations.  Past Medical History:  Diagnosis Date   Carpal tunnel syndrome on left    Cataract 07/2015   R eye--surgery recommended by his eye MD 08/03/15   Chronic fatigue    Chronic renal insufficiency, stage 3 (moderate) 2021   Cr 1.2-1.4, GFR 50s-60s   Closed fracture of greater tuberosity of right humerus 02/28/2019   Immobil w/sling (Dr. Josefina)   Colon cancer screening 02/15/2018   iFOB neg 02/2018,  06/2020, 09/2021, and 02/2023   Erectile dysfunction    Dr. Chales.  Testosterone  normal 04/2016 at [facility].   Gout of big toe 1990s   about 2 episodes per yr, pt favors no prev med   Herpes simplex ophthalmicus    L eye, has opthalmologist, uses prednisone  drops prn   History of kidney stones    History of pneumonia    06/2022 +sepsis--> hospitalization.   History of prostatitis 1980s   Remote hx of seeing Dr. Andra at [facility] urology--then Dr. Chales.    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 Dr. Livingston   Shingles    recurrent L thorax   Thalassemia    Mild anemia with MCV 60s (alpha thal minor or beta thal trait: Hb  electrophoresis not done/no result available.).   Type 2 diabetes mellitus with microalbuminuria (HCC) Dx'd 08/2013    Past Surgical History:  Procedure Laterality Date   CHOLECYSTECTOMY N/A 01/26/2020   Procedure: LAPAROSCOPIC CHOLECYSTECTOMY;  Surgeon: Vernetta Berg, MD;  Location: [facility];  Service: General;  Laterality: N/A;   NASAL POLYP EXCISION  1960   NASAL SINUS SURGERY  03/20/2022   WISDOM TOOTH EXTRACTION      Outpatient Medications Prior to Visit  Medication Sig Dispense Refill   acetaminophen  (TYLENOL ) 500 MG tablet Take 500-1,000 mg by mouth every 6 (six) hours as needed (pain.).     amLODipine  (NORVASC ) 10 MG tablet Take 1 tablet (10 mg total) by mouth daily. 90 tablet 3   atorvastatin  (LIPITOR) 20 MG tablet Take 1 tablet (20 mg total) by mouth daily. 90 tablet 3   azelastine  (ASTELIN ) 0.1 % nasal spray Place 2 sprays into both nostrils 2 (two) times daily. Use in each nostril as directed 30 mL 6   cetirizine (ZYRTEC) 10 MG tablet Take 10 mg by mouth daily.     cholestyramine  (QUESTRAN ) 4 g packet Take 1 packet (4 g total) by mouth once a week. 12 each 3   colchicine  0.6 MG tablet 2 tabs at onset of gout flare, then 1 tab one hour later, then start 1 tab bid until flare is resolved  30 tablet 6   Febuxostat  (ULORIC ) 80 MG TABS 1 tab po qd 90 tablet 1   fluticasone  (FLONASE ) 50 MCG/ACT nasal spray Place 2 sprays into both nostrils daily.     hydrocortisone  valerate ointment (WEST-CORT) 0.2 % Appy bid to affected area prn 15 g 2   metFORMIN  (GLUCOPHAGE ) 1000 MG tablet Take 1 tablet (1,000 mg total) by mouth 2 (two) times daily with a meal. 180 tablet 3   pioglitazone  (ACTOS ) 45 MG tablet Take 1 tablet (45 mg total) by mouth daily. 90 tablet 3   prednisoLONE acetate (PRED FORTE) 1 % ophthalmic suspension Place 1 drop into both eyes daily as needed (eye irritation).     sodium chloride  (OCEAN) 0.65 % SOLN nasal spray Place 1 spray into both nostrils as needed for congestion.      valsartan  (DIOVAN ) 160 MG tablet Take 1 tablet (160 mg total) by mouth daily. 30 tablet 0   No facility-administered medications prior to visit.    Allergies[1]  Review of Systems As per HPI  PE:    02/01/2024    1:16 PM 01/15/2024    8:43 AM 01/15/2024    8:36 AM  Vitals with BMI  Height 6' 1  6' 1  Weight 253 lbs 6 oz  253 lbs 3 oz  BMI 33.44  33.41  Systolic 162 144 853  Diastolic 76 60 74  Pulse 99  76  RA bp 152/72 L arm 150/70  Physical Exam  Gen: Alert, well appearing.  Patient is oriented to person, place, time, and situation. AFFECT: pleasant, lucid thought and speech. CV: RRR, no m/r/g.   LUNGS: CTA bilat, nonlabored resps, good aeration in all lung fields. EXT: 2+ bilat LE pitting edema  LABS:  Last metabolic panel Lab Results  Component Value Date   GLUCOSE 141 (H) 01/15/2024   NA 141 01/15/2024   K 4.0 01/15/2024   CL 107 01/15/2024   CO2 22 01/15/2024   BUN 16 01/15/2024   CREATININE 1.06 01/15/2024   EGFR 73 01/15/2024   CALCIUM  8.9 01/15/2024   PROT 6.5 12/08/2023   ALBUMIN 4.1 12/02/2022   BILITOT 1.5 (H) 12/08/2023   ALKPHOS 58 12/02/2022   AST 15 12/08/2023   ALT 13 12/08/2023   ANIONGAP 13 07/09/2022   Last thyroid functions Lab Results  Component Value Date   TSH 1.37 01/15/2024   Lab Results  Component Value Date   WBC 5.4 12/08/2023   HGB 10.0 (L) 12/08/2023   HCT 34.0 (L) 12/08/2023   MCV 65.4 (L) 12/08/2023   PLT 254 12/08/2023   Lab Results  Component Value Date   IRON 187 (H) 07/09/2016   FERRITIN 260.1 07/09/2016   IMPRESSION AND PLAN:  Uncontrolled HTN. Add toprol xl 50mg  every day.  If bp not <130/80 consistently in 3 days then increase to 2 tabs per day. Cont valsartan  320mg  every day and amlodipine  10mg  every day. BMET today.  An After Visit Summary was printed and given to the patient.  FOLLOW UP: No follow-ups on file.  Signed:  Gerlene Hockey, MD           02/01/2024      [1]   Allergies Allergen Reactions   Hydrochlorothiazide  Other (See Comments)    gout

## 2024-02-01 NOTE — Patient Instructions (Signed)
 Take one metoprolol 50mg  tab once a day. If blood pressure not <130 on top and <80 on bottom in 3 days then increase to TWO tabs per day.

## 2024-02-02 ENCOUNTER — Ambulatory Visit: Payer: Self-pay | Admitting: Family Medicine

## 2024-02-02 LAB — BASIC METABOLIC PANEL WITH GFR
BUN: 16 mg/dL (ref 7–25)
CO2: 19 mmol/L — ABNORMAL LOW (ref 20–32)
Calcium: 9.3 mg/dL (ref 8.6–10.3)
Chloride: 108 mmol/L (ref 98–110)
Creat: 1.05 mg/dL (ref 0.70–1.28)
Glucose, Bld: 153 mg/dL — ABNORMAL HIGH (ref 65–99)
Potassium: 4.4 mmol/L (ref 3.5–5.3)
Sodium: 141 mmol/L (ref 135–146)
eGFR: 74 mL/min/1.73m2

## 2024-02-16 ENCOUNTER — Ambulatory Visit (INDEPENDENT_AMBULATORY_CARE_PROVIDER_SITE_OTHER): Admitting: Family Medicine

## 2024-02-16 ENCOUNTER — Encounter: Payer: Self-pay | Admitting: Family Medicine

## 2024-02-16 VITALS — BP 162/64 | HR 64 | Temp 96.7°F | Ht 73.0 in | Wt 256.4 lb

## 2024-02-16 DIAGNOSIS — Z7984 Long term (current) use of oral hypoglycemic drugs: Secondary | ICD-10-CM

## 2024-02-16 DIAGNOSIS — R600 Localized edema: Secondary | ICD-10-CM | POA: Diagnosis not present

## 2024-02-16 DIAGNOSIS — E1121 Type 2 diabetes mellitus with diabetic nephropathy: Secondary | ICD-10-CM

## 2024-02-16 DIAGNOSIS — I10 Essential (primary) hypertension: Secondary | ICD-10-CM | POA: Diagnosis not present

## 2024-02-16 DIAGNOSIS — R011 Cardiac murmur, unspecified: Secondary | ICD-10-CM | POA: Diagnosis not present

## 2024-02-16 DIAGNOSIS — J310 Chronic rhinitis: Secondary | ICD-10-CM

## 2024-02-16 MED ORDER — METOPROLOL SUCCINATE ER 100 MG PO TB24: 100.0000 mg | ORAL_TABLET | Freq: Every day | ORAL | 3 refills | Status: AC

## 2024-02-16 MED ORDER — FLUTICASONE PROPIONATE 50 MCG/ACT NA SUSP: 2.0000 | Freq: Every day | NASAL | 3 refills | Status: AC

## 2024-02-16 MED ORDER — IPRATROPIUM BROMIDE 0.03 % NA SOLN: 2.0000 | Freq: Two times a day (BID) | NASAL | 3 refills | Status: AC

## 2024-02-16 NOTE — Progress Notes (Signed)
 OFFICE VISIT  02/16/2024  CC:  Chief Complaint  Patient presents with   Medical Management of Chronic Issues    Pt reports home Bp readings average systolic 140-150    Patient is a 74 y.o. male who presents for 2 wk f/u uncontrolled HTN. A/P as of last visit: Uncontrolled HTN. Add toprol  xl 50mg  every day.  If bp not <130/80 consistently in 3 days then increase to 2 tabs per day. Cont valsartan  320mg  every day and amlodipine  10mg  every day. BMET today.  INTERIM HX: Zachary Chandler feels well other than the feeling of chronic rhinitis and postnasal drip that has been worse the last couple of weeks. He takes Astelin  and Flonase  sprays as well as daily Zyrtec. He denies pain in his sinuses.  His ears pop every now and then.  Home blood pressures in the 140-1 150s usually, lowest 137 since I last saw him. He increased his Toprol  to 2 of the 50 mg tabs daily as instructed.  He and his wife have noticed that his lower legs are significantly more swollen than usual. He says he had a bout of gout in his foot a couple weeks ago but it resolved in a couple days.  Review of systems: No headache, visual abnormalities, dizziness, chest pain, palpitations, shortness of breath, dyspnea on exertion, orthopnea, or PND.    Past Medical History:  Diagnosis Date   Carpal tunnel syndrome on left    Cataract 07/2015   R eye--surgery recommended by his eye MD 08/03/15   Chronic fatigue    Chronic renal insufficiency, stage 3 (moderate) 2021   Cr 1.2-1.4, GFR 50s-60s   Closed fracture of greater tuberosity of right humerus 02/28/2019   Immobil w/sling (Dr. Josefina)   Colon cancer screening 02/15/2018   iFOB neg 02/2018,  06/2020, 09/2021, and 02/2023   Erectile dysfunction    Dr. Chales.  Testosterone  normal 04/2016 at [facility].   Gout of big toe 1990s   about 2 episodes per yr, pt favors no prev med   Herpes simplex ophthalmicus    L eye, has opthalmologist, uses prednisone  drops prn   History of kidney  stones    History of pneumonia    06/2022 +sepsis--> hospitalization.   History of prostatitis 1980s   Remote hx of seeing Dr. Andra at [facility] urology--then Dr. Chales.    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 Dr. Livingston   Shingles    recurrent L thorax   Thalassemia    Mild anemia with MCV 60s (alpha thal minor or beta thal trait: Hb electrophoresis not done/no result available.).   Type 2 diabetes mellitus with microalbuminuria (HCC) Dx'd 08/2013    Past Surgical History:  Procedure Laterality Date   CHOLECYSTECTOMY N/A 01/26/2020   Procedure: LAPAROSCOPIC CHOLECYSTECTOMY;  Surgeon: Vernetta Berg, MD;  Location: [facility];  Service: General;  Laterality: N/A;   NASAL POLYP EXCISION  1960   NASAL SINUS SURGERY  03/20/2022   WISDOM TOOTH EXTRACTION      Outpatient Medications Prior to Visit  Medication Sig Dispense Refill   acetaminophen  (TYLENOL ) 500 MG tablet Take 500-1,000 mg by mouth every 6 (six) hours as needed (pain.).     amLODipine  (NORVASC ) 10 MG tablet Take 1 tablet (10 mg total) by mouth daily. 90 tablet 3   atorvastatin  (LIPITOR) 20 MG tablet Take 1 tablet (20 mg total) by mouth daily. 90  tablet 3   cetirizine (ZYRTEC) 10 MG tablet Take 10 mg by mouth daily.     cholestyramine  (QUESTRAN ) 4 g packet Take 1 packet (4 g total) by mouth once a week. 12 each 3   colchicine  0.6 MG tablet 2 tabs at onset of gout flare, then 1 tab one hour later, then start 1 tab bid until flare is resolved 30 tablet 6   Febuxostat  (ULORIC ) 80 MG TABS 1 tab po qd 90 tablet 1   hydrocortisone  valerate ointment (WEST-CORT) 0.2 % Appy bid to affected area prn 15 g 2   metFORMIN  (GLUCOPHAGE ) 1000 MG tablet Take 1 tablet (1,000 mg total) by mouth 2 (two) times daily with a meal. 180 tablet 3   pioglitazone  (ACTOS ) 45 MG tablet Take 1 tablet (45 mg total) by mouth daily. 90 tablet 3    prednisoLONE acetate (PRED FORTE) 1 % ophthalmic suspension Place 1 drop into both eyes daily as needed (eye irritation).     sodium chloride  (OCEAN) 0.65 % SOLN nasal spray Place 1 spray into both nostrils as needed for congestion.     valsartan  (DIOVAN ) 320 MG tablet Take 1 tablet (320 mg total) by mouth daily. 90 tablet 3   azelastine  (ASTELIN ) 0.1 % nasal spray Place 2 sprays into both nostrils 2 (two) times daily. Use in each nostril as directed 30 mL 6   fluticasone  (FLONASE ) 50 MCG/ACT nasal spray Place 2 sprays into both nostrils daily.     metoprolol  succinate (TOPROL -XL) 50 MG 24 hr tablet Take 1 tablet (50 mg total) by mouth daily. Take with or immediately following a meal. 30 tablet 0   No facility-administered medications prior to visit.    Allergies[1]  Review of Systems As per HPI  PE:    02/16/2024   10:52 AM 02/16/2024   10:39 AM 02/01/2024    1:26 PM  Vitals with BMI  Height  6' 1   Weight  256 lbs 6 oz   BMI  33.84   Systolic 162 169 843  Diastolic 64 77 70  Pulse  64      Physical Exam  Gen: Alert, well appearing.  Patient is oriented to person, place, time, and situation. AFFECT: pleasant, lucid thought and speech. ZWU:Zbzd: no injection, icteris, swelling, or exudate.  EOMI, PERRLA. Mouth: lips without lesion/swelling.  Oral mucosa pink and moist. Oropharynx without erythema, exudate, or swelling.  No sinus TTP.  Nose w/out purulent d/c or signif swelling of turbinates. CV: RRR, soft systolic murmur, no diastolic murmur, no r/g.   LUNGS: CTA bilat, nonlabored resps, good aeration in all lung fields. EXT: 3-4 + bilat LE pitting from just below the knees into ankles.  Slightly worse on the left leg.  No erythema/rash or skin breakdown or TTP.  LABS:  Last metabolic panel Lab Results  Component Value Date   GLUCOSE 153 (H) 02/01/2024   NA 141 02/01/2024   K 4.4 02/01/2024   CL 108 02/01/2024   CO2 19 (L) 02/01/2024   BUN 16 02/01/2024   CREATININE  1.05 02/01/2024   EGFR 74 02/01/2024   CALCIUM  9.3 02/01/2024   PROT 6.5 12/08/2023   ALBUMIN 4.1 12/02/2022   BILITOT 1.5 (H) 12/08/2023   ALKPHOS 58 12/02/2022   AST 15 12/08/2023   ALT 13 12/08/2023   ANIONGAP 13 07/09/2022   Last hemoglobin A1c Lab Results  Component Value Date   HGBA1C 6.1 (A) 12/08/2023   HGBA1C 6.1 12/08/2023   HGBA1C 6.1  12/08/2023   HGBA1C 6.1 12/08/2023    IMPRESSION AND PLAN:  #1 uncontrolled hypertension.  He has not taken his medication today yet.  Home blood pressures 140s to 150s systolic for the most part.  Rare measurement in the 130s. At this point unguinal hold off on any changes in his regimen--> continue amlodipine  10 mg a day, Toprol -XL 100 mg a day, and valsartan  320 mg a day. Check renal artery duplex Dopplers, echocardiogram, and plasma metanephrines to further evaluate for secondary hypertension.  #2 bilateral lower extremity edema. This has been chronic but it is significantly worse lately.  He has been on amlodipine  for many years so I do not attribute his recent worsening to this medication. Encouraged limitation of sodium in diet, elevation of legs daily, and we will check echocardiogram for completeness.  #3 systolic heart murmur. This is soft but given #1 and #2 above will go ahead and check echocardiogram.  #4 chronic rhinitis.  Low suspicion of acute sinusitis at this time. He wants to try something to dry up his nasal secretions more so I will discontinue his Astelin  and do a trial of ipratropium nasal spray.  He will continue his Flonase  and Zyrtec. Of note, use of prednisone  in the past has helped this problem significantly but he has a history of marked glucose elevations with prednisone  so we want avoid this if at all possible.  #5 diabetes with nephropathy. Good control on metformin  1000 mg twice daily and Actos  45 mg a day. Next A1c 1 mo  An After Visit Summary was printed and given to the patient.  FOLLOW UP: Return  in about 4 weeks (around 03/15/2024) for f/u bp and dm. Next CPE October/November 2026 Signed:  Gerlene Hockey, MD           02/16/2024      [1]  Allergies Allergen Reactions   Hydrochlorothiazide  Other (See Comments)    gout

## 2024-02-23 LAB — CATECHOLAMINES, FRACTIONATED, PLASMA
Dopamine: 41 pg/mL — ABNORMAL HIGH
Epinephrine: 13 pg/mL
Norepinephrine: 957 pg/mL — ABNORMAL HIGH
Total Catecholamines: 1011 pg/mL

## 2024-02-26 ENCOUNTER — Other Ambulatory Visit: Payer: Self-pay | Admitting: Family Medicine

## 2024-02-26 ENCOUNTER — Telehealth: Payer: Self-pay

## 2024-02-26 DIAGNOSIS — I159 Secondary hypertension, unspecified: Secondary | ICD-10-CM

## 2024-02-26 DIAGNOSIS — R7989 Other specified abnormal findings of blood chemistry: Secondary | ICD-10-CM

## 2024-02-26 DIAGNOSIS — I10 Essential (primary) hypertension: Secondary | ICD-10-CM

## 2024-02-26 NOTE — Telephone Encounter (Signed)
 Copied from CRM [id number]. Topic: Clinical - Lab/Test Results >> Feb 26, 2024  3:29 PM Rea BROCKS wrote: Reason for CRM: Pt's wife called and stated labs were done for patient on 01/06 and they have not heard anything back. They would like to receive a phone call to go over the lab results. Please call at [phone]  Pt had Catecholamines, Fractionated, Plasma test completed on Jan 6th. Please further advise

## 2024-02-26 NOTE — Telephone Encounter (Addendum)
 Spoke with pt, advised of results. No further questions., voiced understanding

## 2024-02-26 NOTE — Telephone Encounter (Signed)
 Please pass on my apologies for not getting back to him sooner. A couple of his adrenal gland tests were elevated.  Nothing alarming but this may be contributing to his high blood pressure. I want to do an MRI of his adrenal glands (abdominal MRI).  He will get a call to schedule it.

## 2024-02-28 ENCOUNTER — Ambulatory Visit: Payer: Self-pay | Admitting: Family Medicine

## 2024-03-01 ENCOUNTER — Other Ambulatory Visit: Payer: Self-pay | Admitting: Family Medicine

## 2024-03-01 DIAGNOSIS — R600 Localized edema: Secondary | ICD-10-CM

## 2024-03-01 DIAGNOSIS — J310 Chronic rhinitis: Secondary | ICD-10-CM

## 2024-03-01 DIAGNOSIS — I10 Essential (primary) hypertension: Secondary | ICD-10-CM

## 2024-03-01 DIAGNOSIS — R011 Cardiac murmur, unspecified: Secondary | ICD-10-CM

## 2024-03-07 ENCOUNTER — Ambulatory Visit (HOSPITAL_BASED_OUTPATIENT_CLINIC_OR_DEPARTMENT_OTHER)

## 2024-03-15 ENCOUNTER — Ambulatory Visit: Admitting: Family Medicine

## 2024-03-18 ENCOUNTER — Encounter: Payer: Self-pay | Admitting: Family Medicine

## 2024-03-18 ENCOUNTER — Ambulatory Visit: Admitting: Family Medicine

## 2024-03-18 VITALS — BP 146/70 | HR 78 | Temp 97.0°F | Ht 73.0 in | Wt 251.4 lb

## 2024-03-18 DIAGNOSIS — E1129 Type 2 diabetes mellitus with other diabetic kidney complication: Secondary | ICD-10-CM

## 2024-03-18 DIAGNOSIS — E119 Type 2 diabetes mellitus without complications: Secondary | ICD-10-CM

## 2024-03-18 DIAGNOSIS — I159 Secondary hypertension, unspecified: Secondary | ICD-10-CM

## 2024-03-18 DIAGNOSIS — I10 Essential (primary) hypertension: Secondary | ICD-10-CM

## 2024-03-18 DIAGNOSIS — R7989 Other specified abnormal findings of blood chemistry: Secondary | ICD-10-CM

## 2024-03-18 LAB — POCT GLYCOSYLATED HEMOGLOBIN (HGB A1C)
HbA1c POC (<> result, manual entry): 5.9 %
HbA1c, POC (controlled diabetic range): 5.9 % (ref 0.0–7.0)
HbA1c, POC (prediabetic range): 5.9 % (ref 5.7–6.4)
Hemoglobin A1C: 5.9 % — AB (ref 4.0–5.6)

## 2024-03-18 NOTE — Patient Instructions (Signed)
 Increase metoprolol  to 1 and 1/2 tabs every evening.  Continue all other medications as is.

## 2024-03-18 NOTE — Progress Notes (Signed)
 OFFICE VISIT  03/18/2024  CC:  Chief Complaint  Patient presents with   Medical Management of Chronic Issues    Hypertension and diabetes follow up    Patient is a 74 y.o. male who presents for 1 month follow-up hypertension and diabetes. A/P as of last visit: #1 uncontrolled hypertension.  He has not taken his medication today yet.  Home blood pressures 140s to 150s systolic for the most part.  Rare measurement in the 130s. At this point unguinal hold off on any changes in his regimen--> continue amlodipine  10 mg a day, Toprol -XL 100 mg a day, and valsartan  320 mg a day. Check renal artery duplex Dopplers, echocardiogram, and plasma metanephrines to further evaluate for secondary hypertension.   #2 bilateral lower extremity edema. This has been chronic but it is significantly worse lately.  He has been on amlodipine  for many years so I do not attribute his recent worsening to this medication. Encouraged limitation of sodium in diet, elevation of legs daily, and we will check echocardiogram for completeness.   #3 systolic heart murmur. This is soft but given #1 and #2 above will go ahead and check echocardiogram.   #4 chronic rhinitis.  Low suspicion of acute sinusitis at this time. He wants to try something to dry up his nasal secretions more so I will discontinue his Astelin  and do a trial of ipratropium nasal spray.  He will continue his Flonase  and Zyrtec. Of note, use of prednisone  in the past has helped this problem significantly but he has a history of marked glucose elevations with prednisone  so we want avoid this if at all possible.   #5 diabetes with nephropathy. Good control on metformin  1000 mg twice daily and Actos  45 mg a day. Next A1c 1 mo  INTERIM HX: Fractionated catecholamines elevated last visit.  MRI abdomen ordered to further evaluate for pheochromocytoma-> not scheduled yet. Echocardiogram and renal artery duplex Dopplers have been scheduled.  He is having  some generalized sinus headaches.  Sometimes feels his heart beating in his ears.  Sometimes feels some lightheadedness and also feels his heart rate go up over 100 with just walking around the room sometimes. He intermittently notes his hands and feet swell.  Review of systems: No fever, no shortness of breath, no chest pain, no visual abnormalities, no diaphoresis, no nausea, no blood in urine, no focal weakness.  Past Medical History:  Diagnosis Date   Carpal tunnel syndrome on left    Cataract 07/2015   R eye--surgery recommended by his eye MD 08/03/15   Chronic fatigue    Chronic renal insufficiency, stage 3 (moderate) 2021   Cr 1.2-1.4, GFR 50s-60s   Closed fracture of greater tuberosity of right humerus 02/28/2019   Immobil w/sling (Dr. Josefina)   Colon cancer screening 02/15/2018   iFOB neg 02/2018,  06/2020, 09/2021, and 02/2023   Erectile dysfunction    Dr. Chales.  Testosterone  normal 04/2016 at [facility].   Gout of big toe 1990s   about 2 episodes per yr, pt favors no prev med   Herpes simplex ophthalmicus    L eye, has opthalmologist, uses prednisone  drops prn   History of kidney stones    History of pneumonia    06/2022 +sepsis--> hospitalization.   History of prostatitis 1980s   Remote hx of seeing Dr. Andra at [facility] urology--then Dr. Chales.    Hyperlipidemia    Hypertension dx'd 08/2013   Microcytic anemia 06/2016   Nodular basal cell carcinoma (BCC)  02/19/2017   right temple-[id number]. Dr. Livingston   Shingles    recurrent L thorax   Thalassemia    Mild anemia with MCV 60s (alpha thal minor or beta thal trait: Hb electrophoresis not done/no result available.).   Type 2 diabetes mellitus with microalbuminuria (HCC) Dx'd 08/2013    Past Surgical History:  Procedure Laterality Date   CHOLECYSTECTOMY N/A 01/26/2020   Procedure: LAPAROSCOPIC CHOLECYSTECTOMY;  Surgeon: Vernetta Berg, MD;  Location: [facility];  Service: General;  Laterality: N/A;   NASAL POLYP  EXCISION  1960   NASAL SINUS SURGERY  03/20/2022   WISDOM TOOTH EXTRACTION      Outpatient Medications Prior to Visit  Medication Sig Dispense Refill   acetaminophen  (TYLENOL ) 500 MG tablet Take 500-1,000 mg by mouth every 6 (six) hours as needed (pain.).     amLODipine  (NORVASC ) 10 MG tablet Take 1 tablet (10 mg total) by mouth daily. 90 tablet 3   atorvastatin  (LIPITOR) 20 MG tablet Take 1 tablet (20 mg total) by mouth daily. 90 tablet 3   cetirizine (ZYRTEC) 10 MG tablet Take 10 mg by mouth daily.     cholestyramine  (QUESTRAN ) 4 g packet Take 1 packet (4 g total) by mouth once a week. 12 each 3   colchicine  0.6 MG tablet 2 tabs at onset of gout flare, then 1 tab one hour later, then start 1 tab bid until flare is resolved 30 tablet 6   Febuxostat  (ULORIC ) 80 MG TABS 1 tab po qd 90 tablet 1   fluticasone  (FLONASE ) 50 MCG/ACT nasal spray Place 2 sprays into both nostrils daily. 48 g 3   hydrocortisone  valerate ointment (WEST-CORT) 0.2 % Appy bid to affected area prn 15 g 2   ipratropium (ATROVENT ) 0.03 % nasal spray Place 2 sprays into both nostrils every 12 (twelve) hours. 30 mL 3   metFORMIN  (GLUCOPHAGE ) 1000 MG tablet Take 1 tablet (1,000 mg total) by mouth 2 (two) times daily with a meal. 180 tablet 3   metoprolol  succinate (TOPROL -XL) 100 MG 24 hr tablet Take 1 tablet (100 mg total) by mouth daily. Take with or immediately following a meal. 90 tablet 3   pioglitazone  (ACTOS ) 45 MG tablet Take 1 tablet (45 mg total) by mouth daily. 90 tablet 3   prednisoLONE acetate (PRED FORTE) 1 % ophthalmic suspension Place 1 drop into both eyes daily as needed (eye irritation).     sodium chloride  (OCEAN) 0.65 % SOLN nasal spray Place 1 spray into both nostrils as needed for congestion.     valsartan  (DIOVAN ) 320 MG tablet Take 1 tablet (320 mg total) by mouth daily. 90 tablet 3   No facility-administered medications prior to visit.    Allergies[1]  Review of Systems As per HPI  PE:     03/18/2024    2:59 PM 02/16/2024   10:52 AM 02/16/2024   10:39 AM  Vitals with BMI  Height 6' 1  6' 1  Weight 251 lbs 6 oz  256 lbs 6 oz  BMI 33.18  33.84  Systolic 163 162 830  Diastolic 70 64 77  Pulse 78  64     Physical Exam  Gen: Alert, well appearing.  Patient is oriented to person, place, time, and situation. AFFECT: pleasant, lucid thought and speech. No further exam today  LABS:  Last CBC Lab Results  Component Value Date   WBC 5.4 12/08/2023   HGB 10.0 (L) 12/08/2023   HCT 34.0 (L) 12/08/2023   MCV 65.4 (  L) 12/08/2023   MCH 19.2 (L) 12/08/2023   RDW 17.9 (H) 12/08/2023   PLT 254 12/08/2023   Lab Results  Component Value Date   IRON 187 (H) 07/09/2016   FERRITIN 260.1 07/09/2016   Last metabolic panel Lab Results  Component Value Date   GLUCOSE 153 (H) 02/01/2024   NA 141 02/01/2024   K 4.4 02/01/2024   CL 108 02/01/2024   CO2 19 (L) 02/01/2024   BUN 16 02/01/2024   CREATININE 1.05 02/01/2024   EGFR 74 02/01/2024   CALCIUM  9.3 02/01/2024   PROT 6.5 12/08/2023   ALBUMIN 4.1 12/02/2022   BILITOT 1.5 (H) 12/08/2023   ALKPHOS 58 12/02/2022   AST 15 12/08/2023   ALT 13 12/08/2023   ANIONGAP 13 07/09/2022   Last lipids Lab Results  Component Value Date   CHOL 107 12/08/2023   HDL 46 12/08/2023   LDLCALC 44 12/08/2023   TRIG 90 12/08/2023   CHOLHDL 2.3 12/08/2023   Last hemoglobin A1c Lab Results  Component Value Date   HGBA1C 6.1 (A) 12/08/2023   HGBA1C 6.1 12/08/2023   HGBA1C 6.1 12/08/2023   HGBA1C 6.1 12/08/2023   Last thyroid functions Lab Results  Component Value Date   TSH 1.37 01/15/2024   IMPRESSION AND PLAN:  #1 uncontrolled hypertension. Suspicion of secondary hypertension. Fractionated plasma catecholamines elevated 02/16/2024. Reordered MRI abdomen and pelvis to specify with and without dye. He will keep appointment for echocardiogram and renal artery Dopplers as scheduled for 03/23/2024. Increase Toprol -XL to 1-1/2 of the  100 mg tabs daily.  Continue amlodipine  10 mg a day and valsartan  320 mg a day. We are avoiding thiazides because he had worsening of his gout on this  medication.  #2 diabetes with microalbuminuria. POC Hba1c today is 5.9%. Continue pioglitazone  45 mg a day and metformin  1000 mg twice daily. Repeat urine microalbumin/creatinine in 3 months.  An After Visit Summary was printed and given to the patient.  FOLLOW UP: No follow-ups on file.  Signed:  Gerlene Hockey, MD           03/18/2024       [1]  Allergies Allergen Reactions   Hydrochlorothiazide  Other (See Comments)    gout

## 2024-03-23 ENCOUNTER — Ambulatory Visit (HOSPITAL_BASED_OUTPATIENT_CLINIC_OR_DEPARTMENT_OTHER)

## 2024-04-15 ENCOUNTER — Ambulatory Visit: Admitting: Family Medicine

## 2024-08-10 ENCOUNTER — Encounter
